# Patient Record
Sex: Female | Born: 1955 | Race: White | Hispanic: No | Smoking: Never smoker
Health system: Southern US, Community
[De-identification: ages and names within clinical notes are randomized; demographics above are authoritative.]

## PROBLEM LIST (undated history)

## (undated) DIAGNOSIS — I10 Essential (primary) hypertension: Secondary | ICD-10-CM

## (undated) DIAGNOSIS — K219 Gastro-esophageal reflux disease without esophagitis: Secondary | ICD-10-CM

## (undated) DIAGNOSIS — T7840XA Allergy, unspecified, initial encounter: Secondary | ICD-10-CM

## (undated) DIAGNOSIS — R011 Cardiac murmur, unspecified: Secondary | ICD-10-CM

## (undated) DIAGNOSIS — L309 Dermatitis, unspecified: Secondary | ICD-10-CM

## (undated) DIAGNOSIS — M1990 Unspecified osteoarthritis, unspecified site: Secondary | ICD-10-CM

## (undated) HISTORY — PX: MASTECTOMY: SHX3

## (undated) HISTORY — DX: Cardiac murmur, unspecified: R01.1

## (undated) HISTORY — PX: GANGLION CYST EXCISION: SHX1691

## (undated) HISTORY — PX: TOTAL ABDOMINAL HYSTERECTOMY: SHX209

## (undated) HISTORY — DX: Allergy, unspecified, initial encounter: T78.40XA

## (undated) HISTORY — PX: BREAST BIOPSY: SHX20

## (undated) HISTORY — DX: Gastro-esophageal reflux disease without esophagitis: K21.9

## (undated) HISTORY — DX: Dermatitis, unspecified: L30.9

## (undated) HISTORY — PX: AUGMENTATION MAMMAPLASTY: SUR837

## (undated) HISTORY — PX: ABDOMINAL HYSTERECTOMY: SHX81

---

## 2006-11-26 ENCOUNTER — Emergency Department: Payer: Self-pay | Admitting: Emergency Medicine

## 2007-09-12 IMAGING — CT CT HEAD WITHOUT CONTRAST
2 series · 16 of 30 positions shown, 20 images · non-contrast
Comparison: none

REASON FOR EXAM: headache
COMMENTS:

PROCEDURE:     CT  - CT HEAD WITHOUT CONTRAST  - November 26, 2006  [DATE]
RESULT:
HISTORY: Headache.
COMPARISON STUDIES: No recent.
PROCEDURE AND FINDINGS: No intra-axial or extra-axial pathologic fluid or
blood collections identified.  No mass lesion is noted. No bony
abnormalities are identified.

[Series 2: without · axial · non-contrast · 0.39mm/px · z∈[+407,+532]mm · 13 of 31 slices shown, 17 images]
[im 3/31  brain]
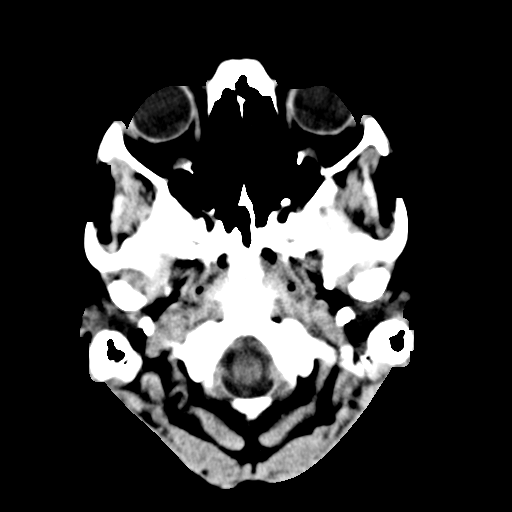
[im 3/31  bone]
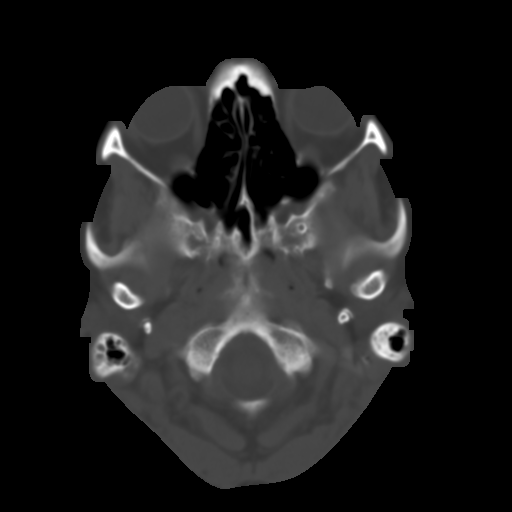
[im 5/31  brain]
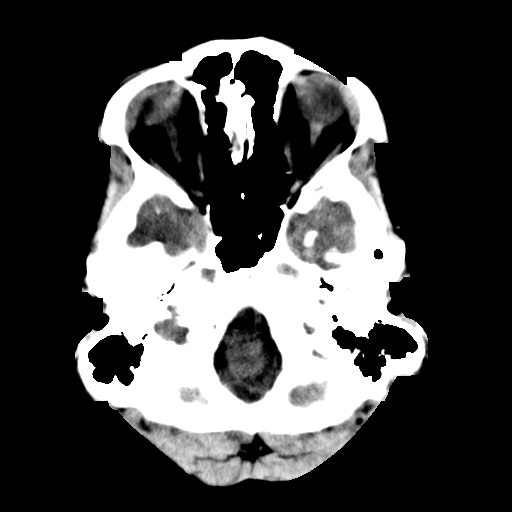
[im 7/31  brain]
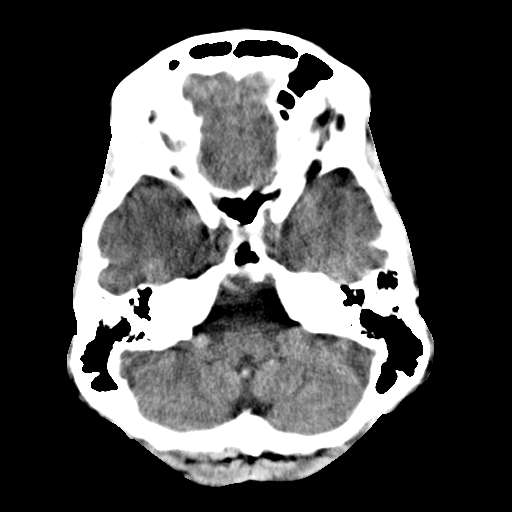
[im 9/31  brain]
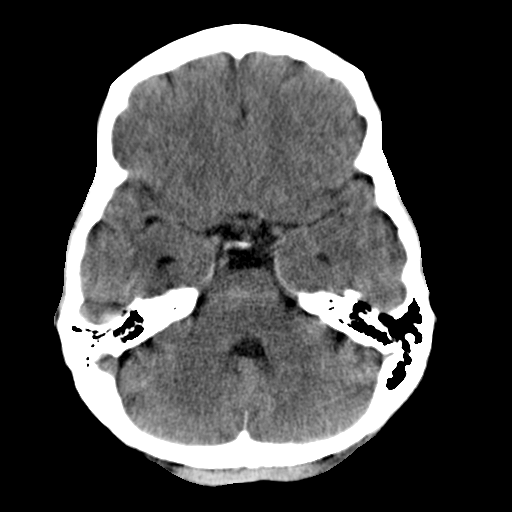
[im 11/31  brain]
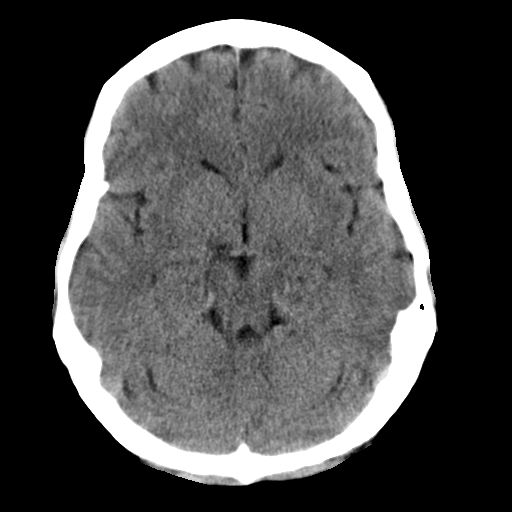
[im 11/31  bone]
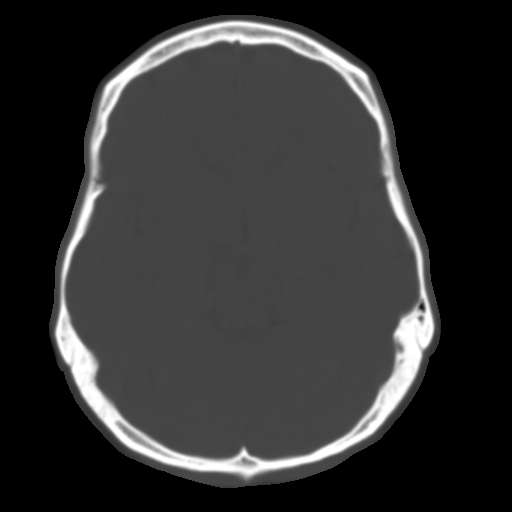
[im 13/31  brain]
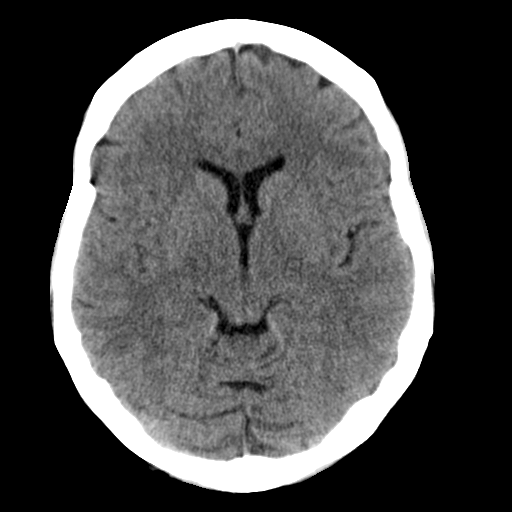
[im 16/31  brain]
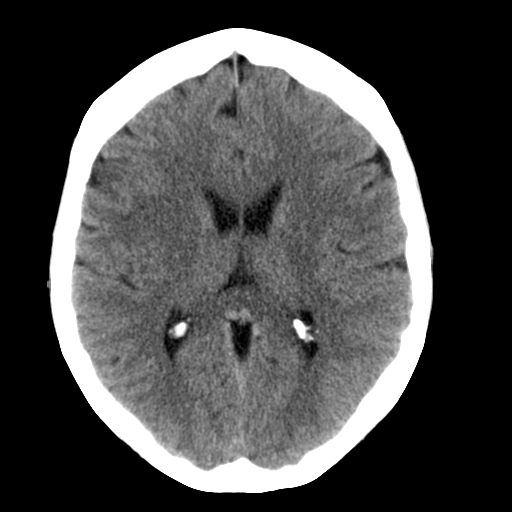
[im 18/31  brain]
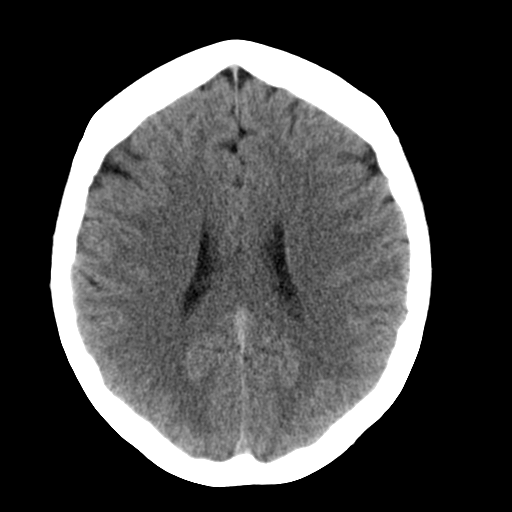
[im 20/31  brain]
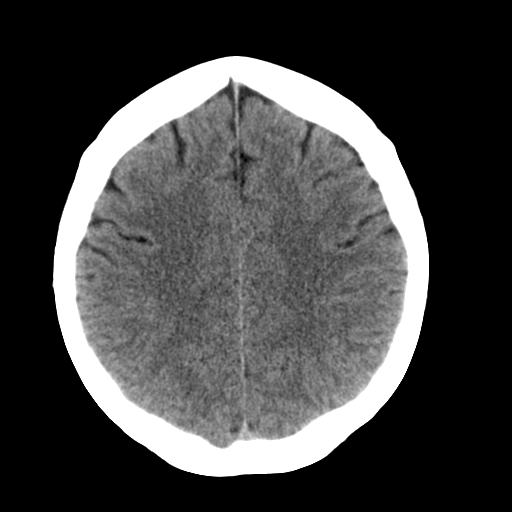
[im 20/31  bone]
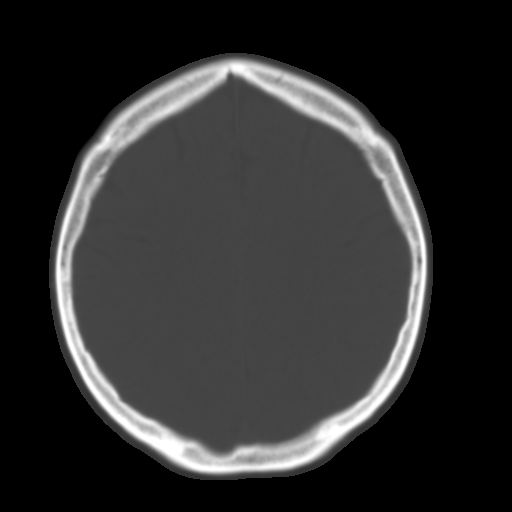
[im 22/31  brain]
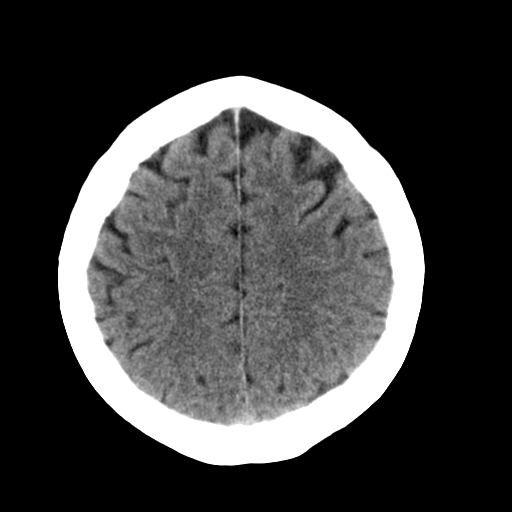
[im 24/31  brain]
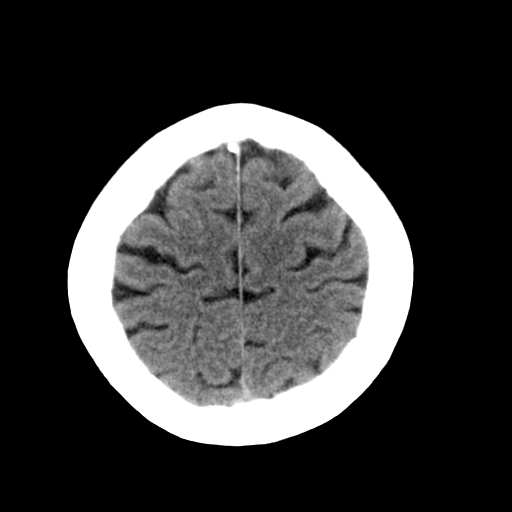
[im 26/31  brain]
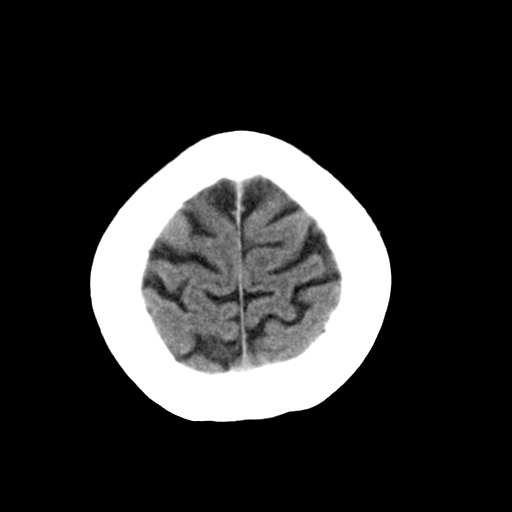
[im 28/31  brain]
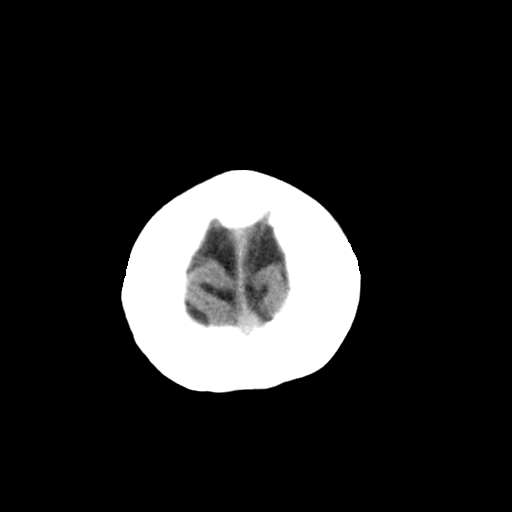
[im 28/31  bone]
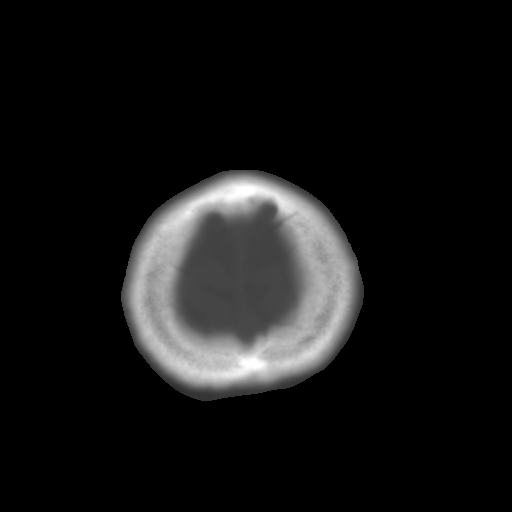

[Series 3: bone · axial · 0.39mm/px · z∈[+407,+447]mm · 3 of 31 slices shown]
[im 3/31  bone]
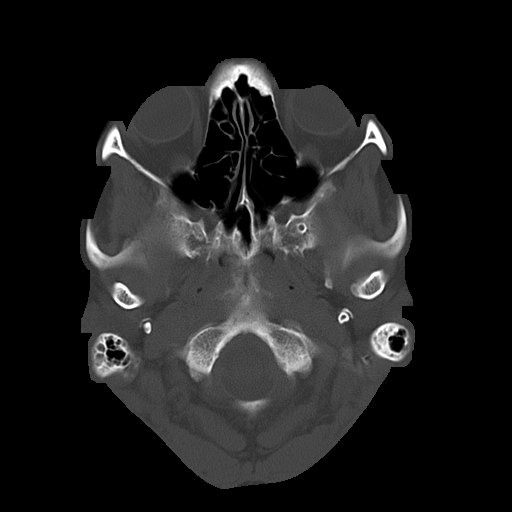
[im 7/31  bone]
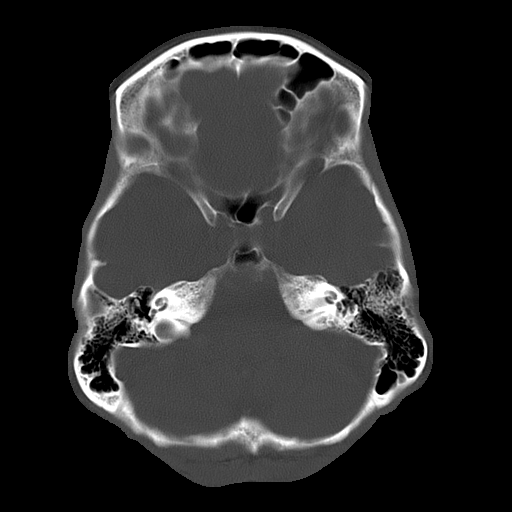
[im 11/31  bone]
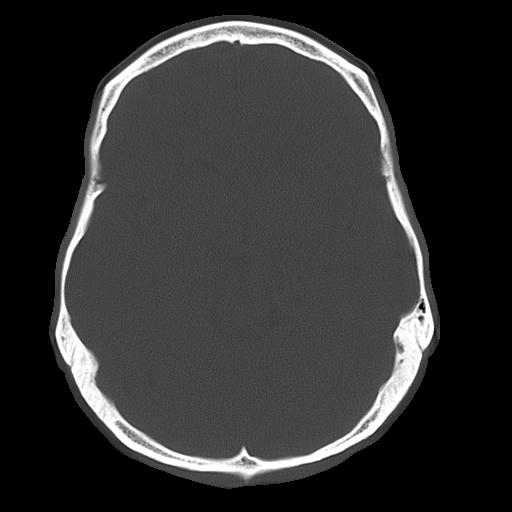

[16 of 30 positions shown; findings below may reference images not displayed]

IMPRESSION: 1)No acute intracranial abnormalities identified.

## 2008-03-16 ENCOUNTER — Ambulatory Visit: Payer: Self-pay | Admitting: Obstetrics and Gynecology

## 2008-03-22 ENCOUNTER — Ambulatory Visit: Payer: Self-pay | Admitting: Obstetrics and Gynecology

## 2013-06-30 ENCOUNTER — Ambulatory Visit (INDEPENDENT_AMBULATORY_CARE_PROVIDER_SITE_OTHER): Payer: Self-pay | Admitting: General Surgery

## 2013-06-30 ENCOUNTER — Encounter: Payer: Self-pay | Admitting: General Surgery

## 2013-06-30 VITALS — BP 140/80 | HR 76 | Resp 12 | Ht 67.0 in | Wt 157.0 lb

## 2013-06-30 DIAGNOSIS — R229 Localized swelling, mass and lump, unspecified: Secondary | ICD-10-CM | POA: Insufficient documentation

## 2013-06-30 NOTE — Patient Instructions (Addendum)
Patient to return in 1 week for skin biopsy.

## 2013-06-30 NOTE — Progress Notes (Signed)
Patient ID: Marie Nguyen, female   DOB: [birth year], 57 y.o.   MRN: [id number]  Chief Complaint  Patient presents with  . Other    Polyarterisis Nodosa     HPI Marie Nguyen is a 57 y.o. female who presents for an evaluation of skin nodules. The patient states she noticed a couple of weeks ago knots in her right lower extremity. No pain associated with this but she does state her right leg that has an aching sensation. She has taken a five day course of Prednisone and says the nodules in thigh area have improved a lot.She has some prominent ones in her right calf.  HPI  Past Medical History  Diagnosis Date  . Allergy   . GERD (gastroesophageal reflux disease)     Past Surgical History  Procedure Laterality Date  . Cesarean section    . Abdominal hysterectomy      History reviewed. No pertinent family history.  Social History History  Substance Use Topics  . Smoking status: Never Smoker   . Smokeless tobacco: Not on file  . Alcohol Use: Yes    Allergies  Allergen Reactions  . Latex Rash    Current Outpatient Prescriptions  Medication Sig Dispense Refill  . cetirizine (ZYRTEC) 10 MG tablet Take 10 mg by mouth daily.      Marland Kitchen loratadine (CLARITIN) 10 MG tablet Take 10 mg by mouth daily.      . ranitidine (ZANTAC) 150 MG capsule Take 150 mg by mouth 2 (two) times daily.       No current facility-administered medications for this visit.    Review of Systems Review of Systems  Constitutional: Negative.   Respiratory: Negative.   Cardiovascular: Negative.     Blood pressure 140/80, pulse 76, resp. rate 12, height 5\' 7"  (1.702 m), weight 157 lb (71.215 kg).  Physical Exam Physical Exam  Constitutional: She is oriented to person, place, and time. She appears well-developed and well-nourished.  Eyes: Conjunctivae are normal. No scleral icterus.  Cardiovascular: Normal rate, regular rhythm and normal heart sounds.   No murmur heard. Pulses:      Dorsalis pedis  pulses are 2+ on the right side, and 2+ on the left side.       Posterior tibial pulses are 2+ on the right side, and 2+ on the left side.  No edema. No varicose veins.  Pulmonary/Chest: Effort normal and breath sounds normal.  Abdominal: Soft. Bowel sounds are normal. There is no tenderness.  Lymphadenopathy:    She has no cervical adenopathy.       Right: No inguinal adenopathy present.       Left: No inguinal adenopathy present.  Neurological: She is alert and oriented to person, place, and time.  Skin: Skin is warm and dry.       Data Reviewed Notes from Zara Chess PA  Assessment    Skin induration and nodularity of new onset of right lower extremity. Findings are not indicative if any infection or reaction to some agent. Remote possibility of inflammatory condition.    Plan    Skin biopsy.     Targeted ultrasound of the skin in the right calf was performed clinical exam had shown as more moderate thickening of the skin and nodular fashion in several spots adjoining each other. Evaluation did not reveal any abnormality in the muscular tissue. The lesser saphenous vein it is identified and is normal. No large subcutaneous veins or evidence of phlebitis noted. Popliteal vein is  also normal. There is some mild prominence of the skin in the immediate the underlying subcutaneous tissue without any defined edges. Impression: Nonspecific findings as noted above  Treazure Nery G 06/30/2013, 3:10 PM

## 2013-07-02 ENCOUNTER — Encounter: Payer: Self-pay | Admitting: General Surgery

## 2013-07-06 ENCOUNTER — Ambulatory Visit (INDEPENDENT_AMBULATORY_CARE_PROVIDER_SITE_OTHER): Payer: Self-pay | Admitting: General Surgery

## 2013-07-06 ENCOUNTER — Encounter: Payer: Self-pay | Admitting: General Surgery

## 2013-07-06 VITALS — BP 132/82 | HR 88 | Resp 16 | Ht 67.0 in | Wt 152.0 lb

## 2013-07-06 DIAGNOSIS — R229 Localized swelling, mass and lump, unspecified: Secondary | ICD-10-CM

## 2013-07-06 NOTE — Progress Notes (Signed)
Patient presents for a right leg skin biopsy at today's visit. No new problems at this time.   Biopsy of the skin nodule right calf was performed today. 3 mL of 1% Xylocaine mixed with 0.5% Marcaine was used. This was over the most prominent skin thickening in the right calf. A 1 cm elliptical skin was excised with marked thickening noted. This was sent for pathology in formalin. The wound is closed with a single stitch of 4-0 nylon. Procedure was tolerated well with minimal discomfort. Dry sterile dressings placed and patient instructed on wound care. She will be notified of pathology when available. Skin suture needs to be removed in about 2 weeks

## 2013-07-06 NOTE — Patient Instructions (Signed)
Patient to return in 10 days for nurse visit for suture removal.

## 2013-07-09 LAB — PATHOLOGY

## 2013-07-10 ENCOUNTER — Telehealth: Payer: Self-pay | Admitting: *Deleted

## 2013-07-10 NOTE — Telephone Encounter (Signed)
Message was left for patient to call the office. Dr. Evette Cristal wants to speak with the patient.

## 2013-07-21 ENCOUNTER — Ambulatory Visit (INDEPENDENT_AMBULATORY_CARE_PROVIDER_SITE_OTHER): Payer: Self-pay | Admitting: *Deleted

## 2013-07-21 DIAGNOSIS — R229 Localized swelling, mass and lump, unspecified: Secondary | ICD-10-CM

## 2013-07-21 NOTE — Progress Notes (Signed)
Here todday for suture removal.  Area clean and healing well. Steri strips applied. Aware of pathology.

## 2013-07-21 NOTE — Patient Instructions (Addendum)
The patient is aware to call back for any questions or concerns.  

## 2014-07-26 ENCOUNTER — Encounter: Payer: Self-pay | Admitting: General Surgery

## 2017-05-30 ENCOUNTER — Ambulatory Visit (INDEPENDENT_AMBULATORY_CARE_PROVIDER_SITE_OTHER): Payer: 59 | Admitting: Podiatry

## 2017-05-30 ENCOUNTER — Ambulatory Visit (INDEPENDENT_AMBULATORY_CARE_PROVIDER_SITE_OTHER): Payer: 59

## 2017-05-30 DIAGNOSIS — M722 Plantar fascial fibromatosis: Secondary | ICD-10-CM

## 2017-05-30 DIAGNOSIS — M2012 Hallux valgus (acquired), left foot: Secondary | ICD-10-CM | POA: Diagnosis not present

## 2017-05-30 DIAGNOSIS — M2011 Hallux valgus (acquired), right foot: Secondary | ICD-10-CM

## 2017-05-30 MED ORDER — MELOXICAM 15 MG PO TABS: 15.0000 mg | ORAL_TABLET | Freq: Every day | ORAL | 0 refills | Status: DC

## 2017-05-30 NOTE — Progress Notes (Signed)
   Subjective:    Patient ID: Marie Nguyen, female    DOB: April 27, 1956, 61 y.o.   MRN: [id number]  HPI    Review of Systems  All other systems reviewed and are negative.      Objective:   Physical Exam        Assessment & Plan:

## 2017-05-30 NOTE — Progress Notes (Signed)
   Subjective:    Patient ID: Marie Nguyen, female    DOB: [birth year], 61 y.o.   MRN: [id number]  HPI this patient presents the office with chief complaint of pain noted in her left heel.  She says the pain has been progressively worsening over the last 6-8 months.  She states she has pain upon standing from a sitting position and in the a.m. upon rising.  She has attempted to use ibuprofen and a brace on her left foot but pain is worsening.  She says that her pain is 8 out of 10.  She says that the pain has worsened in the last 3 weeks and she has difficulty walking on her left foot at this time.  She presents the office today for an evaluation of her left heel.  She also says she started to have pain and discomfort in the big toe joint bunion on the right foot.    Review of Systems  All other systems reviewed and are negative.      Objective:   Physical Exam GENERAL APPEARANCE: Alert, conversant. Appropriately groomed. No acute distress.  VASCULAR: Pedal pulses are  palpable at  [facility] and PT bilateral.  Capillary refill time is immediate to all digits,  Normal temperature gradient.   NEUROLOGIC: sensation is normal to 5.07 monofilament at 5/5 sites bilateral.  Light touch is intact bilateral, Muscle strength normal.  MUSCULOSKELETAL: acceptable muscle strength, tone and stability bilateral.  Intrinsic muscluature intact bilateral.  Rectus appearance of foot and digits noted bilateral. Palpable pain noted at the insertion of the plantar fascia on the left heel  . Mild discomfort extending through the plantar fascia from the heel in the left foot  swelling noted in the left heel.  HAV first MPJ bilaterally...  DERMATOLOGIC: skin color, texture, and turgor are within normal limits.  No preulcerative lesions or ulcers  are seen, no interdigital maceration noted.  No open lesions present.  Digital nails are asymptomatic. No drainage noted.         Assessment & Plan:  Heel Spur left heel.   HAV 1st MPJ  B/L.   IE  X-rays reveal presence of calcification at the insertion of the plantar fascia left foot.  X-rays were taken also for future evaluation of her bunions bilaterally.  After discussion with this patient. I recommended stretching and ice to her left heel.  I also recommended a plantar fascial brace and prescribed Mobitz.  I also recommended an injection into her left heel  after the deposition of approximately 1 mL of the injection,  she pulled back her foot and said she felt a sharp shooting pain in the outside bottom of her left heel.  I discontinued any future injection feelings there was enough medication in her left heel.  I recommended the patient return the office in 2 weeks at which time we will reevaluate her heel and determine future treatment.   Gardiner Barefoot DPM.

## 2017-06-13 ENCOUNTER — Encounter: Payer: Self-pay | Admitting: Podiatry

## 2017-06-13 ENCOUNTER — Ambulatory Visit (INDEPENDENT_AMBULATORY_CARE_PROVIDER_SITE_OTHER): Payer: 59 | Admitting: Podiatry

## 2017-06-13 DIAGNOSIS — M722 Plantar fascial fibromatosis: Secondary | ICD-10-CM | POA: Diagnosis not present

## 2017-06-13 DIAGNOSIS — M2012 Hallux valgus (acquired), left foot: Secondary | ICD-10-CM | POA: Diagnosis not present

## 2017-06-13 DIAGNOSIS — M2011 Hallux valgus (acquired), right foot: Secondary | ICD-10-CM

## 2017-06-13 NOTE — Progress Notes (Signed)
This patient presents to the office follow-up for diagnosis of plantar fasciitis, left heel.  She says that the Mobic caused her to have significant skin eruptions, so she discontinued the medicine. She says that the brace on her left foot is helping.  She says she still has mild discomfort upon rising but it has improved since she started wearing the brace.  She also states that she is now walking, keeping her heel off the ground purposely to prevent any pain from recurring.  She was prepped is having less pain and discomfort at the site, but she has not completely healed at this juncture.  GENERAL APPEARANCE: Alert, conversant. Appropriately groomed. No acute distress.  VASCULAR: Pedal pulses are  palpable at  [facility] and PT bilateral.  Capillary refill time is immediate to all digits,  Normal temperature gradient.  Digital hair growth is present bilateral  NEUROLOGIC: sensation is normal to 5.07 monofilament at 5/5 sites bilateral.  Light touch is intact bilateral, Muscle strength normal.  MUSCULOSKELETAL: acceptable muscle strength, tone and stability bilateral.  Intrinsic muscluature intact bilateral.  Palpable pain at the insertion of the plantar fascia of the left foot has resolved.  No pain extending through the arch of the left foot.  HAV first MPJ bilateral.   DERMATOLOGIC: skin color, texture, and turgor are within normal limits.  No preulcerative lesions or ulcers  are seen, no interdigital maceration noted.  No open lesions present.  Digital nails are asymptomatic. No drainage noted.  Plantar fasciitis left foot.  ROV.  After evaluation and discussion with this patient. She has improved from the previous visit  . I told her to continue to discontinue the Mobic.  Due to the fact that she is not walking by putting weight through her left heel. I am limited in any further treatment.  Therefore, I recommended that she continue to wear the brace and to return to the office when pain recurs   Gardiner Barefoot DPM.

## 2018-11-05 ENCOUNTER — Ambulatory Visit (INDEPENDENT_AMBULATORY_CARE_PROVIDER_SITE_OTHER): Payer: BLUE CROSS/BLUE SHIELD | Admitting: Obstetrics and Gynecology

## 2018-11-05 ENCOUNTER — Other Ambulatory Visit: Payer: Self-pay

## 2018-11-05 ENCOUNTER — Encounter: Payer: Self-pay | Admitting: Obstetrics and Gynecology

## 2018-11-05 VITALS — BP 152/98 | Ht 67.0 in | Wt 158.0 lb

## 2018-11-05 DIAGNOSIS — Z1211 Encounter for screening for malignant neoplasm of colon: Secondary | ICD-10-CM

## 2018-11-05 DIAGNOSIS — Z1321 Encounter for screening for nutritional disorder: Secondary | ICD-10-CM

## 2018-11-05 DIAGNOSIS — Z01419 Encounter for gynecological examination (general) (routine) without abnormal findings: Secondary | ICD-10-CM | POA: Diagnosis not present

## 2018-11-05 DIAGNOSIS — Z1239 Encounter for other screening for malignant neoplasm of breast: Secondary | ICD-10-CM

## 2018-11-05 DIAGNOSIS — Z13220 Encounter for screening for lipoid disorders: Secondary | ICD-10-CM

## 2018-11-05 DIAGNOSIS — Z131 Encounter for screening for diabetes mellitus: Secondary | ICD-10-CM

## 2018-11-05 DIAGNOSIS — Z1329 Encounter for screening for other suspected endocrine disorder: Secondary | ICD-10-CM

## 2018-11-05 DIAGNOSIS — R011 Cardiac murmur, unspecified: Secondary | ICD-10-CM

## 2018-11-05 NOTE — Progress Notes (Signed)
Gynecology Annual Exam  PCP: Arlis Porta., MD (Inactive)  Chief Complaint:  Chief Complaint  Patient presents with  . Gynecologic Exam    History of Present Illness:Patient is a 63 y.o. [id number] presents for annual exam. The patient has no complaints today.   LMP: No LMP recorded. Patient has had a hysterectomy.  The patient is sexually active. She denies dyspareunia.  The patient does perform self breast exams.  There is no notable family history of breast or ovarian cancer in her family.  The patient wears seatbelts: yes.   The patient has regular exercise: not asked.    The patient denies current symptoms of depression.     Patient was told she had heart murmur at urgent care who recommended further workup.  Sleeps on two pillows, no edema, no paroxysmal nocturnal dyspena, no chest pain, no palpitations  Review of Systems: Review of Systems  Constitutional: Negative for chills and fever.  HENT: Negative for congestion.   Respiratory: Negative for cough and shortness of breath.   Cardiovascular: Negative for chest pain and palpitations.  Gastrointestinal: Negative for abdominal pain, constipation, diarrhea, heartburn, nausea and vomiting.  Genitourinary: Negative for dysuria, frequency and urgency.  Skin: Negative for itching and rash.  Neurological: Negative for dizziness and headaches.  Endo/Heme/Allergies: Negative for polydipsia.  Psychiatric/Behavioral: Negative for depression.    Past Medical History:  Past Medical History:  Diagnosis Date  . Allergy   . GERD (gastroesophageal reflux disease)     Past Surgical History:  Past Surgical History:  Procedure Laterality Date  . ABDOMINAL HYSTERECTOMY    . CESAREAN SECTION      Gynecologic History:  No LMP recorded. Patient has had a hysterectomy. Last Pap: Results were: N/A status post hysterectomy  Obstetric History: [id number]  Family History:  Family History  Problem Relation Age of Onset  .  Cancer Mother   . Hypertension Mother   . Diabetes Father   . Hypertension Father     Social History:  Social History   Socioeconomic History  . Marital status: Divorced    Spouse name: Not on file  . Number of children: Not on file  . Years of education: Not on file  . Highest education level: Not on file  Occupational History  . Not on file  Social Needs  . Financial resource strain: Not on file  . Food insecurity:    Worry: Not on file    Inability: Not on file  . Transportation needs:    Medical: Not on file    Non-medical: Not on file  Tobacco Use  . Smoking status: Never Smoker  . Smokeless tobacco: Never Used  Substance and Sexual Activity  . Alcohol use: Yes  . Drug use: No  . Sexual activity: Not on file  Lifestyle  . Physical activity:    Days per week: Not on file    Minutes per session: Not on file  . Stress: Not on file  Relationships  . Social connections:    Talks on phone: Not on file    Gets together: Not on file    Attends religious service: Not on file    Active member of club or organization: Not on file    Attends meetings of clubs or organizations: Not on file    Relationship status: Not on file  . Intimate partner violence:    Fear of current or ex partner: Not on file    Emotionally abused:  Not on file    Physically abused: Not on file    Forced sexual activity: Not on file  Other Topics Concern  . Not on file  Social History Narrative  . Not on file    Allergies:  Allergies  Allergen Reactions  . Latex Rash    Medications: Prior to Admission medications   Medication Sig Start Date End Date Taking? Authorizing Provider  cetirizine (ZYRTEC) 10 MG tablet Take 10 mg by mouth daily.   Yes [provider]  fluocinonide (LIDEX) 0.05 % external solution  04/11/17   [provider]  ketoconazole (NIZORAL) 2 % shampoo  04/10/17   [provider]  meloxicam (MOBIC) 15 MG tablet Take 1 tablet (15 mg total) by  mouth daily. Patient not taking: Reported on 11/05/2018 05/30/17   Gardiner Barefoot, DPM  ranitidine (ZANTAC) 150 MG capsule Take 150 mg by mouth 2 (two) times daily.    [provider]  triamcinolone cream (KENALOG) 0.1 %  04/10/17   [provider]    Physical Exam Vitals: Blood pressure (!) 152/98, height 5\' 7"  (1.702 m), weight 158 lb (71.7 kg).  General: NAD HEENT: normocephalic, anicteric Thyroid: no enlargement, no palpable nodules Pulmonary: No increased work of breathing, CTAB Cardiovascular: RRR, distal pulses 2+ Breast: Breast symmetrical, no tenderness, no palpable nodules or masses, no skin or nipple retraction present, no nipple discharge.  No axillary or supraclavicular lymphadenopathy.  Bilateral periareolar scars, and suprapectoral silicone implants.  She does have some capsular contracture of the right implant. Abdomen: NABS, soft, non-tender, non-distended.  Umbilicus without lesions.  No hepatomegaly, splenomegaly or masses palpable. No evidence of hernia  Genitourinary:  External: Normal external female genitalia.  Normal urethral meatus, normal Bartholin's and Skene's glands.    Vagina: Normal vaginal mucosa, no evidence of prolapse.    Cervix: surgically absent  Uterus: surgically absent  Adnexa: ovaries non-enlarged, no adnexal masses  Rectal: deferred  Lymphatic: no evidence of inguinal lymphadenopathy Extremities: no edema, erythema, or tenderness Neurologic: Grossly intact Psychiatric: mood appropriate, affect full  Female chaperone present for pelvic and breast  portions of the physical exam     Assessment: 63 y.o. [id number] routine annual exam  Plan: Problem List Items Addressed This Visit    None    Visit Diagnoses    Encounter for gynecological examination without abnormal finding    -  Primary   Breast screening       Relevant Orders   MM 3D SCREEN BREAST BILATERAL   Colon cancer screening       Relevant Orders   Ambulatory  referral to Gastroenterology   Heart murmur       Relevant Orders   Ambulatory referral to Cardiology   Thyroid disorder screening       Relevant Orders   CMP14+LP+TP+TSH+CBC/Plt   Diabetes mellitus screening       Relevant Orders   CMP14+LP+TP+TSH+CBC/Plt   Lipid screening       Relevant Orders   CMP14+LP+TP+TSH+CBC/Plt   Encounter for vitamin deficiency screening       Relevant Orders   CMP14+LP+TP+TSH+CBC/Plt      1) Mammogram - recommend yearly screening mammogram.  Mammogram Was ordered today  2) STI screening  was notoffered and therefore not obtained  3) ASCCP guidelines and rational discussed.  Patient opts for discontinue secondary to prior hysterectomy screening interval  4) Osteoporosis  - per USPTF routine screening DEXA at age 3  Consider FDA-approved medical therapies in postmenopausal  women and men aged 39 years and older, based on the following: a) A hip or vertebral (clinical or morphometric) fracture b) T-score ? -2.5 at the femoral neck or spine after appropriate evaluation to exclude secondary causes C) Low bone mass (T-score between -1.0 and -2.5 at the femoral neck or spine) and a 10-year probability of a hip fracture ? 3% or a 10-year probability of a major osteoporosis-related fracture ? 20% based on the [place]-adapted WHO algorithm   5) Routine healthcare maintenance including cholesterol, diabetes screening discussed To return fasting at a later date  6) Colonoscopy ordered  7) Heart murmur - not appreciated on exam today.  Cardiology referral placed  8) Return in about 1 year (around 11/06/2019) for annual (to return for fasting labs in the next 1-2 weeks).    Malachy Mood, MD Mosetta Pigeon, [facility] 11/05/2018, 2:29 PM

## 2018-11-05 NOTE — Patient Instructions (Signed)
[facility] [address] [place] [address]  [facility] [place]  [address] [place]  [address]  Phone: [phone]  

## 2018-11-19 ENCOUNTER — Other Ambulatory Visit: Payer: BLUE CROSS/BLUE SHIELD

## 2018-11-19 DIAGNOSIS — Z1329 Encounter for screening for other suspected endocrine disorder: Secondary | ICD-10-CM

## 2018-11-19 DIAGNOSIS — Z131 Encounter for screening for diabetes mellitus: Secondary | ICD-10-CM

## 2018-11-19 DIAGNOSIS — Z1321 Encounter for screening for nutritional disorder: Secondary | ICD-10-CM

## 2018-11-19 DIAGNOSIS — Z13220 Encounter for screening for lipoid disorders: Secondary | ICD-10-CM

## 2018-11-20 LAB — CMP14+LP+TP+TSH+CBC/PLT
ALT: 14 IU/L (ref 0–32)
AST: 21 IU/L (ref 0–40)
Albumin/Globulin Ratio: 2.3 — ABNORMAL HIGH (ref 1.2–2.2)
Albumin: 4.5 g/dL (ref 3.8–4.9)
Alkaline Phosphatase: 60 IU/L (ref 39–117)
BUN / CREAT RATIO: 12 (ref 12–28)
BUN: 9 mg/dL (ref 8–27)
Bilirubin Total: 1.1 mg/dL (ref 0.0–1.2)
CO2: 24 mmol/L (ref 20–29)
Calcium: 9.5 mg/dL (ref 8.7–10.3)
Chloride: 106 mmol/L (ref 96–106)
Cholesterol, Total: 181 mg/dL (ref 100–199)
Creatinine, Ser: 0.74 mg/dL (ref 0.57–1.00)
Free Thyroxine Index: 2.3 (ref 1.2–4.9)
GFR calc Af Amer: 102 mL/min/{1.73_m2} (ref >59)
GFR calc non Af Amer: 88 mL/min/{1.73_m2} (ref >59)
Globulin, Total: 2 g/dL (ref 1.5–4.5)
Glucose: 91 mg/dL (ref 65–99)
HDL: 65 mg/dL (ref >39)
Hematocrit: 44.2 % (ref 34.0–46.6)
Hemoglobin: 14.7 g/dL (ref 11.1–15.9)
LDL Calculated: 101 mg/dL — ABNORMAL HIGH (ref 0–99)
LDl/HDL Ratio: 1.6 ratio (ref 0.0–3.2)
MCH: 31.3 pg (ref 26.6–33.0)
MCHC: 33.3 g/dL (ref 31.5–35.7)
MCV: 94 fL (ref 79–97)
PLATELETS: 271 10*3/uL (ref 150–450)
Potassium: 4.3 mmol/L (ref 3.5–5.2)
RBC: 4.7 x10E6/uL (ref 3.77–5.28)
RDW: 12.8 % (ref 11.7–15.4)
SODIUM: 144 mmol/L (ref 134–144)
T3 Uptake Ratio: 28 % (ref 24–39)
T4, Total: 8.3 ug/dL (ref 4.5–12.0)
TSH: 2.81 u[IU]/mL (ref 0.450–4.500)
Total Protein: 6.5 g/dL (ref 6.0–8.5)
Triglycerides: 75 mg/dL (ref 0–149)
VLDL Cholesterol Cal: 15 mg/dL (ref 5–40)
WBC: 5.1 10*3/uL (ref 3.4–10.8)

## 2018-11-24 ENCOUNTER — Ambulatory Visit
Admission: RE | Admit: 2018-11-24 | Discharge: 2018-11-24 | Disposition: A | Discharge status: Home / Self Care | Payer: BLUE CROSS/BLUE SHIELD | Source: Ambulatory Visit | Attending: Gastroenterology | Admitting: Gastroenterology

## 2018-11-24 ENCOUNTER — Encounter: Payer: Self-pay | Admitting: *Deleted

## 2018-11-24 ENCOUNTER — Encounter
Admission: RE | Disposition: A | Discharge status: Home / Self Care | Payer: Self-pay | Source: Ambulatory Visit | Attending: Gastroenterology

## 2018-11-24 ENCOUNTER — Ambulatory Visit: Payer: BLUE CROSS/BLUE SHIELD | Admitting: Anesthesiology

## 2018-11-24 DIAGNOSIS — D120 Benign neoplasm of cecum: Secondary | ICD-10-CM | POA: Diagnosis not present

## 2018-11-24 DIAGNOSIS — K219 Gastro-esophageal reflux disease without esophagitis: Secondary | ICD-10-CM | POA: Insufficient documentation

## 2018-11-24 DIAGNOSIS — D122 Benign neoplasm of ascending colon: Secondary | ICD-10-CM

## 2018-11-24 DIAGNOSIS — Z1211 Encounter for screening for malignant neoplasm of colon: Secondary | ICD-10-CM | POA: Insufficient documentation

## 2018-11-24 DIAGNOSIS — K640 First degree hemorrhoids: Secondary | ICD-10-CM | POA: Insufficient documentation

## 2018-11-24 DIAGNOSIS — Z791 Long term (current) use of non-steroidal anti-inflammatories (NSAID): Secondary | ICD-10-CM | POA: Diagnosis not present

## 2018-11-24 HISTORY — PX: COLONOSCOPY WITH PROPOFOL: SHX5780

## 2018-11-24 SURGERY — COLONOSCOPY WITH PROPOFOL
Anesthesia: General

## 2018-11-24 MED ORDER — SODIUM CHLORIDE 0.9 % IV SOLN
INTRAVENOUS | Status: DC
  Administered 2018-11-24: 09:00:00 via INTRAVENOUS

## 2018-11-24 MED ORDER — PROPOFOL 10 MG/ML IV BOLUS
INTRAVENOUS | Status: DC | PRN
  Administered 2018-11-24: 70 mg via INTRAVENOUS

## 2018-11-24 MED ORDER — PROPOFOL 500 MG/50ML IV EMUL
INTRAVENOUS | Status: AC
  Filled 2018-11-24: qty 50

## 2018-11-24 MED ORDER — PROPOFOL 500 MG/50ML IV EMUL
INTRAVENOUS | Status: DC | PRN
  Administered 2018-11-24: 150 ug/kg/min via INTRAVENOUS

## 2018-11-24 MED ORDER — LIDOCAINE 2% (20 MG/ML) 5 ML SYRINGE
INTRAMUSCULAR | Status: DC | PRN
  Administered 2018-11-24: 50 mg via INTRAVENOUS

## 2018-11-24 MED ORDER — LIDOCAINE HCL (PF) 2 % IJ SOLN
INTRAMUSCULAR | Status: AC
  Filled 2018-11-24: qty 10

## 2018-11-24 NOTE — Transfer of Care (Signed)
Immediate Anesthesia Transfer of Care Note  Patient: Marie Nguyen  Procedure(s) Performed: COLONOSCOPY WITH PROPOFOL (N/A )  Patient Location: Endoscopy Unit  Anesthesia Type:General  Level of Consciousness: drowsy  Airway & Oxygen Therapy: Patient Spontanous Breathing  Post-op Assessment: Post -op Vital signs reviewed and stable  Post vital signs: stable  Last Vitals:  Vitals Value Taken Time  BP 135/76 11/24/2018 10:10 AM  Temp 36.4 C 11/24/2018 10:10 AM  Pulse 65 11/24/2018 10:12 AM  Resp 23 11/24/2018 10:12 AM  SpO2 98 % 11/24/2018 10:12 AM  Vitals shown include unvalidated device data.  Last Pain:  Vitals:   11/24/18 1010  TempSrc: Tympanic  PainSc: 0-No pain         Complications: No apparent anesthesia complications

## 2018-11-24 NOTE — Op Note (Signed)
[facility] Gastroenterology Patient Name: Marie Nguyen Procedure Date: 11/24/2018 9:20 AM MRN: [id number] Account #: [id number] Date of Birth: [birth year] Admit Type: Outpatient Age: 63 Room: [facility] ENDO ROOM 2 Gender: Female Note Status: Finalized Procedure:            Colonoscopy Indications:          Screening for colorectal malignant neoplasm Providers:            Jonathon Bellows MD, MD Referring MD:         Stoney Bang. Georgianne Fick (Referring MD) Medicines:            Monitored Anesthesia Care Complications:        No immediate complications. Procedure:            Pre-Anesthesia Assessment:                       - Prior to the procedure, a History and Physical was                        performed, and patient medications, allergies and                        sensitivities were reviewed. The patient's tolerance of                        previous anesthesia was reviewed.                       - The risks and benefits of the procedure and the                        sedation options and risks were discussed with the                        patient. All questions were answered and informed                        consent was obtained.                       - ASA Grade Assessment: II - A patient with mild                        systemic disease.                       After obtaining informed consent, the colonoscope was                        passed under direct vision. Throughout the procedure,                        the patient's blood pressure, pulse, and oxygen                        saturations were monitored continuously. The                        Colonoscope was introduced through the anus and  advanced to the the cecum, identified by the                        appendiceal orifice, IC valve and transillumination.                        The colonoscopy was performed without difficulty. The                        patient tolerated the procedure  well. The quality of                        the bowel preparation was good. Findings:      The perianal and digital rectal examinations were normal.      Non-bleeding internal hemorrhoids were found during retroflexion. The       hemorrhoids were medium-sized and Grade I (internal hemorrhoids that do       not prolapse).      A 15 mm polyp was found in the cecum. The polyp was flat. Preparations       were made for mucosal resection. Eleview was injected to raise the       lesion. Snare mucosal resection was performed. Resection and retrieval       were complete. To close a defect after mucosal resection, three       hemostatic clips were successfully placed. There was no bleeding during,       or at the end, of the procedure.      The exam was otherwise without abnormality on direct and retroflexion       views. Impression:           - Non-bleeding internal hemorrhoids.                       - One 15 mm polyp in the cecum, removed with mucosal                        resection. Resected and retrieved. Clips were placed.                       - The examination was otherwise normal on direct and                        retroflexion views.                       - Mucosal resection was performed. Resection and                        retrieval were complete. Recommendation:       - Discharge patient to home (with escort).                       - Resume previous diet.                       - Continue present medications.                       - Await pathology results.                       -  Repeat colonoscopy for surveillance based on                        pathology results. Procedure Code(s):    --- Professional ---                       [phone], Colonoscopy, flexible; with endoscopic mucosal                        resection Diagnosis Code(s):    --- Professional ---                       Z12.11, Encounter for screening for malignant neoplasm                        of colon                        D12.0, Benign neoplasm of cecum                       K64.0, First degree hemorrhoids CPT copyright 2018 American Medical Association. All rights reserved. The codes documented in this report are preliminary and upon coder review may  be revised to meet current compliance requirements. Jonathon Bellows, MD Jonathon Bellows MD, MD 11/24/2018 10:09:22 AM This report has been signed electronically. Number of Addenda: 0 Note Initiated On: 11/24/2018 9:20 AM Scope Withdrawal Time: 0 hours 20 minutes 47 seconds  Total Procedure Duration: 0 hours 26 minutes 8 seconds       [facility]

## 2018-11-24 NOTE — H&P (Signed)
Jonathon Bellows, MD [address], [place], [place], [place], [address] [address], [place], [place], [place], [address] Phone: [phone]  Fax: [phone]  Primary Care Physician:  [facility], [facility]   Pre-Procedure History & Physical: HPI:  Marie Nguyen is a 63 y.o. female is here for an colonoscopy.   Past Medical History:  Diagnosis Date  . Allergy   . GERD (gastroesophageal reflux disease)     Past Surgical History:  Procedure Laterality Date  . ABDOMINAL HYSTERECTOMY    . CESAREAN SECTION      Prior to Admission medications   Medication Sig Start Date End Date Taking? Authorizing Provider  cetirizine (ZYRTEC) 10 MG tablet Take 10 mg by mouth daily.   Yes [provider]  omeprazole (PRILOSEC) 20 MG capsule Take 20 mg by mouth daily.   Yes [provider]  fluocinonide (LIDEX) 0.05 % external solution  04/11/17   [provider]  ketoconazole (NIZORAL) 2 % shampoo  04/10/17   [provider]  meloxicam (MOBIC) 15 MG tablet Take 1 tablet (15 mg total) by mouth daily. Patient not taking: Reported on 11/05/2018 05/30/17   Gardiner Barefoot, DPM  ranitidine (ZANTAC) 150 MG capsule Take 150 mg by mouth 2 (two) times daily.    [provider]  triamcinolone cream (KENALOG) 0.1 %  04/10/17   [provider]    Allergies as of 11/06/2018 - Review Complete 11/05/2018  Allergen Reaction Noted  . Latex Rash 06/30/2013    Family History  Problem Relation Age of Onset  . Cancer Mother   . Hypertension Mother   . Diabetes Father   . Hypertension Father     Social History   Socioeconomic History  . Marital status: Divorced    Spouse name: Not on file  . Number of children: Not on file  . Years of education: Not on file  . Highest education level: Not on file  Occupational History  . Not on file  Social Needs  . Financial resource strain: Not on file  . Food insecurity:    Worry: Not on file   Inability: Not on file  . Transportation needs:    Medical: Not on file    Non-medical: Not on file  Tobacco Use  . Smoking status: Never Smoker  . Smokeless tobacco: Never Used  Substance and Sexual Activity  . Alcohol use: Yes  . Drug use: No  . Sexual activity: Not on file  Lifestyle  . Physical activity:    Days per week: Not on file    Minutes per session: Not on file  . Stress: Not on file  Relationships  . Social connections:    Talks on phone: Not on file    Gets together: Not on file    Attends religious service: Not on file    Active member of club or organization: Not on file    Attends meetings of clubs or organizations: Not on file    Relationship status: Not on file  . Intimate partner violence:    Fear of current or ex partner: Not on file    Emotionally abused: Not on file    Physically abused: Not on file    Forced sexual activity: Not on file  Other Topics Concern  . Not on file  Social History Narrative  . Not on file    Review of Systems: See HPI, otherwise negative ROS  Physical Exam: BP (!) 142/88  Pulse 69   Temp 97.9 F (36.6 C) (Tympanic)   Resp 16   Ht 5\' 7"  (1.702 m)   Wt 63.5 kg   SpO2 100%   BMI 21.93 kg/m  General:   Alert,  pleasant and cooperative in NAD Head:  Normocephalic and atraumatic. Neck:  Supple; no masses or thyromegaly. Lungs:  Clear throughout to auscultation, normal respiratory effort.    Heart:  +S1, +S2, Regular rate and rhythm, No edema. Abdomen:  Soft, nontender and nondistended. Normal bowel sounds, without guarding, and without rebound.   Neurologic:  Alert and  oriented x4;  grossly normal neurologically.  Impression/Plan: Marie Nguyen is here for an colonoscopy to be performed for Screening colonoscopy average risk   Risks, benefits, limitations, and alternatives regarding  colonoscopy have been reviewed with the patient.  Questions have been answered.  All parties agreeable.   Jonathon Bellows, MD   11/24/2018, 9:24 AM

## 2018-11-24 NOTE — Anesthesia Preprocedure Evaluation (Signed)
Anesthesia Evaluation  Patient identified by MRN, date of birth, ID band Patient awake    Reviewed: Allergy & Precautions, H&P , NPO status , Patient's Chart, lab work & pertinent test results, reviewed documented beta blocker date and time   History of Anesthesia Complications Negative for: history of anesthetic complications  Airway Mallampati: II  TM Distance: >3 FB Neck ROM: full    Dental  (+) Dental Advidsory Given   Pulmonary neg pulmonary ROS,           Cardiovascular Exercise Tolerance: Good negative cardio ROS       Neuro/Psych negative neurological ROS  negative psych ROS   GI/Hepatic Neg liver ROS, GERD  ,  Endo/Other  negative endocrine ROS  Renal/GU negative Renal ROS  negative genitourinary   Musculoskeletal   Abdominal   Peds  Hematology negative hematology ROS (+)   Anesthesia Other Findings Past Medical History: No date: Allergy No date: GERD (gastroesophageal reflux disease)   Reproductive/Obstetrics negative OB ROS                             Anesthesia Physical Anesthesia Plan  ASA: II  Anesthesia Plan: General   Post-op Pain Management:    Induction: Intravenous  PONV Risk Score and Plan: 3 and Propofol infusion and TIVA  Airway Management Planned: Natural Airway and Nasal Cannula  Additional Equipment:   Intra-op Plan:   Post-operative Plan:   Informed Consent: I have reviewed the patients History and Physical, chart, labs and discussed the procedure including the risks, benefits and alternatives for the proposed anesthesia with the patient or authorized representative who has indicated his/her understanding and acceptance.     Dental Advisory Given  Plan Discussed with: Anesthesiologist, CRNA and Surgeon  Anesthesia Plan Comments:         Anesthesia Quick Evaluation

## 2018-11-24 NOTE — Anesthesia Postprocedure Evaluation (Signed)
Anesthesia Post Note  Patient: Marie Nguyen  Procedure(s) Performed: COLONOSCOPY WITH PROPOFOL (N/A )  Patient location during evaluation: Endoscopy Anesthesia Type: General Level of consciousness: awake and alert Pain management: pain level controlled Vital Signs Assessment: post-procedure vital signs reviewed and stable Respiratory status: spontaneous breathing, nonlabored ventilation, respiratory function stable and patient connected to nasal cannula oxygen Cardiovascular status: blood pressure returned to baseline and stable Postop Assessment: no apparent nausea or vomiting Anesthetic complications: no     Last Vitals:  Vitals:   11/24/18 1030 11/24/18 1040  BP: (!) 150/57 (!) 152/94  Pulse: 65 72  Resp: (!) 22 20  Temp:    SpO2: 99% 97%    Last Pain:  Vitals:   11/24/18 1040  TempSrc:   PainSc: 0-No pain                 Martha Clan

## 2018-11-24 NOTE — Anesthesia Post-op Follow-up Note (Signed)
Anesthesia QCDR form completed.        

## 2018-11-25 LAB — SURGICAL PATHOLOGY

## 2018-11-26 ENCOUNTER — Encounter: Payer: Self-pay | Admitting: Gastroenterology

## 2018-12-04 ENCOUNTER — Encounter: Payer: Self-pay | Admitting: Gastroenterology

## 2019-03-24 DIAGNOSIS — J0140 Acute pansinusitis, unspecified: Secondary | ICD-10-CM | POA: Diagnosis not present

## 2019-04-20 DIAGNOSIS — R0981 Nasal congestion: Secondary | ICD-10-CM | POA: Diagnosis not present

## 2019-04-20 DIAGNOSIS — Z20828 Contact with and (suspected) exposure to other viral communicable diseases: Secondary | ICD-10-CM | POA: Diagnosis not present

## 2019-05-06 DIAGNOSIS — J342 Deviated nasal septum: Secondary | ICD-10-CM | POA: Diagnosis not present

## 2019-05-06 DIAGNOSIS — J3489 Other specified disorders of nose and nasal sinuses: Secondary | ICD-10-CM | POA: Diagnosis not present

## 2019-05-06 DIAGNOSIS — T444X5A Adverse effect of predominantly alpha-adrenoreceptor agonists, initial encounter: Secondary | ICD-10-CM | POA: Diagnosis not present

## 2019-05-06 DIAGNOSIS — J019 Acute sinusitis, unspecified: Secondary | ICD-10-CM | POA: Diagnosis not present

## 2019-05-20 DIAGNOSIS — T444X5A Adverse effect of predominantly alpha-adrenoreceptor agonists, initial encounter: Secondary | ICD-10-CM | POA: Diagnosis not present

## 2019-05-20 DIAGNOSIS — R0981 Nasal congestion: Secondary | ICD-10-CM | POA: Diagnosis not present

## 2019-05-20 DIAGNOSIS — J019 Acute sinusitis, unspecified: Secondary | ICD-10-CM | POA: Diagnosis not present

## 2019-10-23 DIAGNOSIS — H6692 Otitis media, unspecified, left ear: Secondary | ICD-10-CM | POA: Diagnosis not present

## 2019-10-23 DIAGNOSIS — J0100 Acute maxillary sinusitis, unspecified: Secondary | ICD-10-CM | POA: Diagnosis not present

## 2020-05-04 DIAGNOSIS — Z20822 Contact with and (suspected) exposure to covid-19: Secondary | ICD-10-CM | POA: Diagnosis not present

## 2020-05-04 DIAGNOSIS — J0101 Acute recurrent maxillary sinusitis: Secondary | ICD-10-CM | POA: Diagnosis not present

## 2020-07-13 DIAGNOSIS — H6123 Impacted cerumen, bilateral: Secondary | ICD-10-CM | POA: Diagnosis not present

## 2020-09-06 DIAGNOSIS — Z20822 Contact with and (suspected) exposure to covid-19: Secondary | ICD-10-CM | POA: Diagnosis not present

## 2020-09-06 DIAGNOSIS — J0140 Acute pansinusitis, unspecified: Secondary | ICD-10-CM | POA: Diagnosis not present

## 2021-01-10 DIAGNOSIS — R030 Elevated blood-pressure reading, without diagnosis of hypertension: Secondary | ICD-10-CM | POA: Diagnosis not present

## 2021-01-10 DIAGNOSIS — J0100 Acute maxillary sinusitis, unspecified: Secondary | ICD-10-CM | POA: Diagnosis not present

## 2021-01-10 DIAGNOSIS — J301 Allergic rhinitis due to pollen: Secondary | ICD-10-CM | POA: Diagnosis not present

## 2021-01-10 DIAGNOSIS — Z20822 Contact with and (suspected) exposure to covid-19: Secondary | ICD-10-CM | POA: Diagnosis not present

## 2021-01-17 ENCOUNTER — Encounter: Payer: Self-pay | Admitting: Family Medicine

## 2021-01-17 ENCOUNTER — Other Ambulatory Visit: Payer: Self-pay

## 2021-01-17 ENCOUNTER — Ambulatory Visit (INDEPENDENT_AMBULATORY_CARE_PROVIDER_SITE_OTHER): Payer: BC Managed Care – PPO | Admitting: Family Medicine

## 2021-01-17 VITALS — BP 160/112 | HR 84 | Ht 67.0 in | Wt 152.0 lb

## 2021-01-17 DIAGNOSIS — Z7689 Persons encountering health services in other specified circumstances: Secondary | ICD-10-CM | POA: Diagnosis not present

## 2021-01-17 DIAGNOSIS — I10 Essential (primary) hypertension: Secondary | ICD-10-CM

## 2021-01-17 MED ORDER — HYDROCHLOROTHIAZIDE 12.5 MG PO TABS: 12.5000 mg | ORAL_TABLET | Freq: Every day | ORAL | 3 refills | Status: DC

## 2021-01-17 NOTE — Progress Notes (Signed)
Date:  01/17/2021   Name:  Marie Nguyen   DOB:  Jun 30, 1956   MRN:  [id number]   Chief Complaint: Marie Nguyen (Told has heart murmur x 2-3 years ago) and Hypertension (Has had elevated readings)  Hypertension This is a chronic problem. The current episode started more than 1 year ago. The problem has been waxing and waning since onset. The problem is controlled. Pertinent negatives include no anxiety, blurred vision, chest pain, headaches, malaise/fatigue, neck pain, orthopnea, palpitations, peripheral edema, PND, shortness of breath or sweats. Past treatments include nothing. The current treatment provides moderate improvement. There are no compliance problems.  There is no history of angina, kidney disease, CAD/MI, CVA, heart failure, left ventricular hypertrophy or retinopathy. There is no history of chronic renal disease, a hypertension causing med or renovascular disease.    Lab Results  Component Value Date   CREATININE 0.74 11/19/2018   BUN 9 11/19/2018   NA 144 11/19/2018   K 4.3 11/19/2018   CL 106 11/19/2018   CO2 24 11/19/2018   Lab Results  Component Value Date   CHOL 181 11/19/2018   HDL 65 11/19/2018   LDLCALC 101 (H) 11/19/2018   TRIG 75 11/19/2018   Lab Results  Component Value Date   TSH 2.810 11/19/2018   No results found for: HGBA1C Lab Results  Component Value Date   WBC 5.1 11/19/2018   HGB 14.7 11/19/2018   HCT 44.2 11/19/2018   MCV 94 11/19/2018   PLT 271 11/19/2018   Lab Results  Component Value Date   ALT 14 11/19/2018   AST 21 11/19/2018   ALKPHOS 60 11/19/2018   BILITOT 1.1 11/19/2018     Review of Systems  Constitutional: Negative.  Negative for chills, fatigue, fever, malaise/fatigue and unexpected weight change.  HENT: Negative for congestion, ear discharge, ear pain, rhinorrhea, sinus pressure, sneezing and sore throat.   Eyes: Negative for blurred vision, photophobia, pain, discharge, redness and itching.  Respiratory:  Negative for cough, shortness of breath, wheezing and stridor.   Cardiovascular: Negative for chest pain, palpitations, orthopnea and PND.  Gastrointestinal: Negative for abdominal pain, blood in stool, constipation, diarrhea, nausea and vomiting.  Endocrine: Negative for cold intolerance, heat intolerance, polydipsia, polyphagia and polyuria.  Genitourinary: Negative for dysuria, flank pain, frequency, hematuria, menstrual problem, pelvic pain, urgency, vaginal bleeding and vaginal discharge.  Musculoskeletal: Negative for arthralgias, back pain, myalgias and neck pain.  Skin: Negative for rash.  Allergic/Immunologic: Negative for environmental allergies and food allergies.  Neurological: Negative for dizziness, weakness, light-headedness, numbness and headaches.  Hematological: Negative for adenopathy. Does not bruise/bleed easily.  Psychiatric/Behavioral: Negative for dysphoric mood. The patient is not nervous/anxious.     Patient Active Problem List   Diagnosis Date Noted  . Skin nodule 06/30/2013    Allergies  Allergen Reactions  . Latex Rash    Past Surgical History:  Procedure Laterality Date  . ABDOMINAL HYSTERECTOMY    . CESAREAN SECTION    . COLONOSCOPY WITH PROPOFOL N/A 11/24/2018   Procedure: COLONOSCOPY WITH PROPOFOL;  Surgeon: Jonathon Bellows, MD;  Location: [facility] ENDOSCOPY;  Service: Gastroenterology;  Laterality: N/A;    Social History   Tobacco Use  . Smoking status: Never Smoker  . Smokeless tobacco: Never Used  Vaping Use  . Vaping Use: Never used  Substance Use Topics  . Alcohol use: Yes    Comment: occasional  . Drug use: No     Medication list has been reviewed and  updated.  Current Meds  Medication Sig  . cetirizine (ZYRTEC) 10 MG tablet Take 10 mg by mouth daily.  Marland Kitchen omeprazole (PRILOSEC) 20 MG capsule Take 20 mg by mouth daily.    PHQ 2/9 Scores 01/17/2021  PHQ - 2 Score 0  PHQ- 9 Score 0    GAD 7 : Generalized Anxiety Score 01/17/2021   Nervous, Anxious, on Edge 0  Control/stop worrying 1  Worry too much - different things 0  Trouble relaxing 0  Restless 0  Easily annoyed or irritable 0  Afraid - awful might happen 0  Total GAD 7 Score 1  Anxiety Difficulty Not difficult at all    BP Readings from Last 3 Encounters:  01/17/21 (!) 160/112  11/24/18 (!) 152/94  11/05/18 (!) 152/98    Physical Exam Vitals and nursing note reviewed.  Constitutional:      Appearance: She is well-developed.  HENT:     Head: Normocephalic.     Right Ear: Tympanic membrane, ear canal and external ear normal.     Left Ear: Tympanic membrane, ear canal and external ear normal.     Nose: Nose normal.     Mouth/Throat:     Mouth: Mucous membranes are moist.  Eyes:     General: Lids are everted, no foreign bodies appreciated. No scleral icterus.       Left eye: No foreign body or hordeolum.     Conjunctiva/sclera: Conjunctivae normal.     Right eye: Right conjunctiva is not injected.     Left eye: Left conjunctiva is not injected.     Pupils: Pupils are equal, round, and reactive to light.  Neck:     Thyroid: No thyromegaly.     Vascular: No JVD.     Trachea: No tracheal deviation.  Cardiovascular:     Rate and Rhythm: Normal rate and regular rhythm.     Heart sounds: S1 normal and S2 normal. Murmur heard.   Systolic murmur is present with a grade of 1/6.  No diastolic murmur is present. No friction rub. No gallop. No S3 or S4 sounds.   Pulmonary:     Effort: Pulmonary effort is normal. No respiratory distress.     Breath sounds: Normal breath sounds. No decreased breath sounds, wheezing, rhonchi or rales.  Abdominal:     General: Bowel sounds are normal.     Palpations: Abdomen is soft. There is no mass.     Tenderness: There is no abdominal tenderness. There is no guarding or rebound.  Musculoskeletal:        General: No tenderness. Normal range of motion.     Cervical back: Normal range of motion and neck supple.      Right lower leg: No edema.     Left lower leg: No edema.  Lymphadenopathy:     Cervical: No cervical adenopathy.  Skin:    General: Skin is warm.     Findings: No rash.  Neurological:     Mental Status: She is alert and oriented to person, place, and time.     Cranial Nerves: No cranial nerve deficit.     Deep Tendon Reflexes: Reflexes normal.  Psychiatric:        Mood and Affect: Mood is not anxious or depressed.     Wt Readings from Last 3 Encounters:  01/17/21 152 lb (68.9 kg)  11/24/18 140 lb (63.5 kg)  11/05/18 158 lb (71.7 kg)    BP (!) 160/112   Pulse 84  Ht 5\' 7"  (1.702 m)   Wt 152 lb (68.9 kg)   BMI 23.81 kg/m   Assessment and Plan: 1. Establishing care with new doctor, encounter for Sent establishing care with new physician.  Review of her past medical history is unremarkable.  Review of systems is normal in physical exam was normal.  2. Primary hypertension Chronic.  Persistent.  Uncontrolled.  Blood pressure today is 160/112.  This has been verified at other physician offices and we will initiate hydrochlorothiazide 12.5 mg once a day and will recheck in 6 weeks.  Patient is also being given a Dash diet and information on cooking with less salt. - hydrochlorothiazide (HYDRODIURIL) 12.5 MG tablet; Take 1 tablet (12.5 mg total) by mouth daily.  Dispense: 90 tablet; Refill: 3

## 2021-01-17 NOTE — Patient Instructions (Addendum)
How to Take Your Blood Pressure Blood pressure measures how strongly your blood is pressing against the walls of your arteries. Arteries are blood vessels that carry blood from your heart throughout your body. You can take your blood pressure at home with a machine. You may need to check your blood pressure at home:  To check if you have high blood pressure (hypertension).  To check your blood pressure over time.  To make sure your blood pressure medicine is working. Supplies needed:  Blood pressure machine, or monitor.  Dining room chair to sit in.  Table or desk.  Small notebook.  Pencil or pen. How to prepare Avoid these things for 30 minutes before checking your blood pressure:  Having drinks with caffeine in them, such as coffee or tea.  Drinking alcohol.  Eating.  Smoking.  Exercising. Do these things five minutes before checking your blood pressure:  Go to the bathroom and pee (urinate).  Sit in a dining chair. Do not sit in a soft couch or an armchair.  Be quiet. Do not talk. How to take your blood pressure Follow the instructions that came with your machine. If you have a digital blood pressure monitor, these may be the instructions: 1. Sit up straight. 2. Place your feet on the floor. Do not cross your ankles or legs. 3. Rest your left arm at the level of your heart. You may rest it on a table, desk, or chair. 4. Pull up your shirt sleeve. 5. Wrap the blood pressure cuff around the upper part of your left arm. The cuff should be 1 inch (2.5 cm) above your elbow. It is best to wrap the cuff around bare skin. 6. Fit the cuff snugly around your arm. You should be able to place only one finger between the cuff and your arm. 7. Place the cord so that it rests in the bend of your elbow. 8. Press the power button. 9. Sit quietly while the cuff fills with air and loses air. 10. Write down the numbers on the screen. 11. Wait 2-3 minutes and then repeat steps 1-10.    What do the numbers mean? Two numbers make up your blood pressure. The first number is called systolic pressure. The second is called diastolic pressure. An example of a blood pressure reading is "120 over 80" (or 120/80). If you are an adult and do not have a medical condition, use this guide to find out if your blood pressure is normal: Normal  First number: below 120.  Second number: below 80. Elevated  First number: 120-129.  Second number: below 80. Hypertension stage 1  First number: 130-139.  Second number: 80-89. Hypertension stage 2  First number: 140 or above.  Second number: 90 or above. Your blood pressure is above normal even if only the top or bottom number is above normal. Follow these instructions at home:  Check your blood pressure as often as your doctor tells you to.  Check your blood pressure at the same time every day.  Take your monitor to your next doctor's appointment. Your doctor will: ? Make sure you are using it correctly. ? Make sure it is working right.  Make sure you understand what your blood pressure numbers should be.  Tell your doctor if your medicine is causing side effects.  Keep all follow-up visits as told by your doctor. This is important. General tips:  You will need a blood pressure machine, or monitor. Your doctor can suggest a   monitor. You can buy one at a drugstore or online. When choosing one: ? Choose one with an arm cuff. ? Choose one that wraps around your upper arm. Only one finger should fit between your arm and the cuff. ? Do not choose one that measures your blood pressure from your wrist or finger. Where to find more information American Heart Association: www.heart.org Contact a doctor if:  Your blood pressure keeps being high. Get help right away if:  Your first blood pressure number is higher than 180.  Your second blood pressure number is higher than 120. Summary  Check your blood pressure at the same  time every day.  Avoid caffeine, alcohol, smoking, and exercise for 30 minutes before checking your blood pressure.  Make sure you understand what your blood pressure numbers should be. This information is not intended to replace advice given to you by your health care provider. Make sure you discuss any questions you have with your health care provider. Document Revised: 09/04/2019 Document Reviewed: 09/04/2019 Elsevier Patient Education  2021 [place] [employer] with less salt is one way to reduce the amount of sodium you get from food. Sodium is one of the elements that make up salt. It is found naturally in foods and is also added to certain foods. Depending on your condition and overall health, your health care provider or dietitian may recommend that you reduce your sodium intake. Most people should have less than 2,300 milligrams (mg) of sodium each day. If you have high blood pressure (hypertension), you may need to limit your sodium to 1,500 mg each day. Follow the tips below to help reduce your sodium intake. What are tips for eating less sodium? Reading food labels  Check the food label before buying or using packaged ingredients. Always check the label for the serving size and sodium content.  Look for products with no more than 140 mg of sodium in one serving.  Check the % Daily Value column to see what percent of the daily recommended amount of sodium is provided in one serving of the product. Foods with 5% or less in this column are considered low in sodium. Foods with 20% or higher are considered high in sodium.  Do not choose foods with salt as one of the first three ingredients on the ingredients list. If salt is one of the first three ingredients, it usually means the item is high in sodium.   Shopping  Buy sodium-free or low-sodium products. Look for the following words on food labels: ? Low-sodium. ? Sodium-free. ? Reduced-sodium. ? No salt  added. ? Unsalted.  Always check the sodium content even if foods are labeled as low-sodium or no salt added.  Buy fresh foods. Cooking  Use herbs, seasonings without salt, and spices as substitutes for salt.  Use sodium-free baking soda when baking.  Grill, braise, or roast foods to add flavor with less salt.  Avoid adding salt to pasta, rice, or hot cereals.  Drain and rinse canned vegetables, beans, and meat before use.  Avoid adding salt when cooking sweets and desserts.  Cook with low-sodium ingredients. What foods are high in sodium? Vegetables Regular canned vegetables (not low-sodium or reduced-sodium). Sauerkraut, pickled vegetables, and relishes. Olives. [place] fries. Onion rings. Regular canned tomato sauce and paste. Regular tomato and vegetable juice. Frozen vegetables in sauces. Grains Instant hot cereals. Bread stuffing, pancake, and biscuit mixes. Croutons. Seasoned rice or pasta mixes. Noodle soup cups. Boxed or  frozen macaroni and cheese. Regular salted crackers. Self-rising flour. Rolls. Bagels. Flour tortillas and wraps. Meats and other proteins Meat or fish that is salted, canned, smoked, cured, spiced, or pickled. This includes bacon, ham, sausages, hot dogs, corned beef, chipped beef, meat loaves, salt pork, jerky, pickled herring, anchovies, regular canned tuna, and sardines. Salted nuts. Dairy Processed cheese and cheese spreads. Cheese curds. Blue cheese. Feta cheese. String cheese. Regular cottage cheese. Buttermilk. Canned milk. The items listed above may not be a complete list of foods high in sodium. Actual amounts of sodium may be different depending on processing. Contact a dietitian for more information. What foods are low in sodium? Fruits Fresh, frozen, or canned fruit with no sauce added. Fruit juice. Vegetables Fresh or frozen vegetables with no sauce added. "No salt added" canned vegetables. "No salt added" tomato sauce and paste. Low-sodium or  reduced-sodium tomato and vegetable juice. Grains Noodles, pasta, quinoa, rice. Shredded or puffed wheat or puffed rice. Regular or quick oats (not instant). Low-sodium crackers. Low-sodium bread. Whole-grain bread and whole-grain pasta. Unsalted popcorn. Meats and other proteins Fresh or frozen whole meats, poultry (not injected with sodium), and fish with no sauce added. Unsalted nuts. Dried peas, beans, and lentils without added salt. Unsalted canned beans. Eggs. Unsalted nut butters. Low-sodium canned tuna or chicken. Dairy Milk. Soy milk. Yogurt. Low-sodium cheeses, such as Swiss, Monte[place] Jack, [place], and [employer]. Sherbet or ice cream (keep to  cup per serving). Cream cheese. Fats and oils Unsalted butter or margarine. Other foods Homemade pudding. Sodium-free baking soda and baking powder. Herbs and spices. Low-sodium seasoning mixes. Beverages Coffee and tea. Carbonated beverages. The items listed above may not be a complete list of foods low in sodium. Actual amounts of sodium may be different depending on processing. Contact a dietitian for more information. What are some salt alternatives when cooking? The following are herbs, seasonings, and spices that can be used instead of salt to flavor your food. Herbs should be fresh or dried. Do not choose packaged mixes. Next to the name of the herb, spice, or seasoning are some examples of foods you can pair it with. Herbs  Bay leaves - Soups, meat and vegetable dishes, and spaghetti sauce.  Basil - [employer], soups, pasta, and fish dishes.  Cilantro - Meat, poultry, and vegetable dishes.  Chili powder - Marinades and Mexican dishes.  Chives - Salad dressings and potato dishes.  Cumin - Mexican dishes, couscous, and meat dishes.  Dill - Fish dishes, sauces, and salads.  Fennel - Meat and vegetable dishes, breads, and cookies.  Garlic (do not use garlic salt) - [place] dishes, meat dishes, salad dressings, and  sauces.  Marjoram - Soups, potato dishes, and meat dishes.  Oregano - Pizza and spaghetti sauce.  Parsley - Salads, soups, pasta, and meat dishes.  Rosemary - [place] dishes, salad dressings, soups, and red meats.  Saffron - Fish dishes, pasta, and some poultry dishes.  Sage - Stuffings and sauces.  Tarragon - Fish and [employer].  Thyme - Stuffing, meat, and fish dishes. Seasonings  Lemon juice - Fish dishes, poultry dishes, vegetables, and salads.  Vinegar - Salad dressings, vegetables, and fish dishes. Spices  Cinnamon - Sweet dishes, such as cakes, cookies, and puddings.  Cloves - Gingerbread, puddings, and marinades for meats.  Curry - Vegetable dishes, fish and poultry dishes, and stir-fry dishes.  Ginger - Vegetable dishes, fish dishes, and stir-fry dishes.  Nutmeg - Pasta, vegetables, poultry, fish dishes, and  custard. Summary  Cooking with less salt is one way to reduce the amount of sodium that you get from food.  Buy sodium-free or low-sodium products.  Check the food label before using or buying packaged ingredients.  Use herbs, seasonings without salt, and spices as substitutes for salt in foods. This information is not intended to replace advice given to you by your health care provider. Make sure you discuss any questions you have with your health care provider. Document Revised: 09/02/2019 Document Reviewed: 09/02/2019 Elsevier Patient Education  2021 [place].  [email or web address]">  DASH Eating Plan DASH stands for Dietary Approaches to Stop Hypertension. The DASH eating plan is a healthy eating plan that has been shown to:  Reduce high blood pressure (hypertension).  Reduce your risk for type 2 diabetes, heart disease, and stroke.  Help with weight loss. What are tips for following this plan? Reading food labels  Check food labels for the amount of salt (sodium) per serving. Choose foods  with less than 5 percent of the Daily Value of sodium. Generally, foods with less than 300 milligrams (mg) of sodium per serving fit into this eating plan.  To find whole grains, look for the word "whole" as the first word in the ingredient list. Shopping  Buy products labeled as "low-sodium" or "no salt added."  Buy fresh foods. Avoid canned foods and pre-made or frozen meals. Cooking  Avoid adding salt when cooking. Use salt-free seasonings or herbs instead of table salt or sea salt. Check with your health care provider or pharmacist before using salt substitutes.  Do not fry foods. Cook foods using healthy methods such as baking, boiling, grilling, roasting, and broiling instead.  Cook with heart-healthy oils, such as olive, canola, avocado, soybean, or sunflower oil. Meal planning  Eat a balanced diet that includes: ? 4 or more servings of fruits and 4 or more servings of vegetables each day. Try to fill one-half of your plate with fruits and vegetables. ? 6-8 servings of whole grains each day. ? Less than 6 oz (170 g) of lean meat, poultry, or fish each day. A 3-oz (85-g) serving of meat is about the same size as a deck of cards. One egg equals 1 oz (28 g). ? 2-3 servings of low-fat dairy each day. One serving is 1 cup (237 mL). ? 1 serving of nuts, seeds, or beans 5 times each week. ? 2-3 servings of heart-healthy fats. Healthy fats called omega-3 fatty acids are found in foods such as walnuts, flaxseeds, fortified milks, and eggs. These fats are also found in cold-water fish, such as sardines, salmon, and mackerel.  Limit how much you eat of: ? Canned or prepackaged foods. ? Food that is high in trans fat, such as some fried foods. ? Food that is high in saturated fat, such as fatty meat. ? Desserts and other sweets, sugary drinks, and other foods with added sugar. ? Full-fat dairy products.  Do not salt foods before eating.  Do not eat more than 4 egg yolks a week.  Try to  eat at least 2 vegetarian meals a week.  Eat more home-cooked food and less restaurant, buffet, and fast food.   Lifestyle  When eating at a restaurant, ask that your food be prepared with less salt or no salt, if possible.  If you drink alcohol: ? Limit how much you use to:  0-1 drink a day for women who are not pregnant.  0-2 drinks a day for men. ?  Be aware of how much alcohol is in your drink. In the [place] one drink equals one 12 oz bottle of beer (355 mL), one 5 oz glass of wine (148 mL), or one 1 oz glass of hard liquor (44 mL). General information  Avoid eating more than 2,300 mg of salt a day. If you have hypertension, you may need to reduce your sodium intake to 1,500 mg a day.  Work with your health care provider to maintain a healthy body weight or to lose weight. Ask what an ideal weight is for you.  Get at least 30 minutes of exercise that causes your heart to beat faster (aerobic exercise) most days of the week. Activities may include walking, swimming, or biking.  Work with your health care provider or dietitian to adjust your eating plan to your individual calorie needs. What foods should I eat? Fruits All fresh, dried, or frozen fruit. Canned fruit in natural juice (without added sugar). Vegetables Fresh or frozen vegetables (raw, steamed, roasted, or grilled). Low-sodium or reduced-sodium tomato and vegetable juice. Low-sodium or reduced-sodium tomato sauce and tomato paste. Low-sodium or reduced-sodium canned vegetables. Grains Whole-grain or whole-wheat bread. Whole-grain or whole-wheat pasta. Brown rice. [place]. Bulgur. Whole-grain and low-sodium cereals. Pita bread. Low-fat, low-sodium crackers. Whole-wheat flour tortillas. Meats and other proteins Skinless chicken or [place]. Ground chicken or [place]. Pork with fat trimmed off. Fish and seafood. Egg whites. Dried beans, peas, or lentils. Unsalted nuts, nut butters, and seeds. Unsalted canned beans. Lean  cuts of beef with fat trimmed off. Low-sodium, lean precooked or cured meat, such as sausages or meat loaves. Dairy Low-fat (1%) or fat-free (skim) milk. Reduced-fat, low-fat, or fat-free cheeses. Nonfat, low-sodium ricotta or cottage cheese. Low-fat or nonfat yogurt. Low-fat, low-sodium cheese. Fats and oils Soft margarine without trans fats. Vegetable oil. Reduced-fat, low-fat, or light mayonnaise and salad dressings (reduced-sodium). Canola, safflower, olive, avocado, soybean, and sunflower oils. Avocado. Seasonings and condiments Herbs. Spices. Seasoning mixes without salt. Other foods Unsalted popcorn and pretzels. Fat-free sweets. The items listed above may not be a complete list of foods and beverages you can eat. Contact a dietitian for more information. What foods should I avoid? Fruits Canned fruit in a light or heavy syrup. Fried fruit. Fruit in cream or butter sauce. Vegetables Creamed or fried vegetables. Vegetables in a cheese sauce. Regular canned vegetables (not low-sodium or reduced-sodium). Regular canned tomato sauce and paste (not low-sodium or reduced-sodium). Regular tomato and vegetable juice (not low-sodium or reduced-sodium). Angie Fava. Olives. Grains Baked goods made with fat, such as croissants, muffins, or some breads. Dry pasta or rice meal packs. Meats and other proteins Fatty cuts of meat. Ribs. Fried meat. Berniece Salines. Bologna, salami, and other precooked or cured meats, such as sausages or meat loaves. Fat from the back of a pig (fatback). Bratwurst. Salted nuts and seeds. Canned beans with added salt. Canned or smoked fish. Whole eggs or egg yolks. Chicken or [place] with skin. Dairy Whole or 2% milk, cream, and half-and-half. Whole or full-fat cream cheese. Whole-fat or sweetened yogurt. Full-fat cheese. Nondairy creamers. Whipped toppings. Processed cheese and cheese spreads. Fats and oils Butter. Stick margarine. Lard. Shortening. Ghee. Bacon fat. Tropical oils, such  as coconut, palm kernel, or palm oil. Seasonings and condiments Onion salt, garlic salt, seasoned salt, table salt, and sea salt. Worcestershire sauce. Tartar sauce. Barbecue sauce. Teriyaki sauce. Soy sauce, including reduced-sodium. Steak sauce. Canned and packaged gravies. Fish sauce. Oyster sauce. Cocktail sauce. Store-bought horseradish. Ketchup. Mustard.  Meat flavorings and tenderizers. Bouillon cubes. Hot sauces. Pre-made or packaged marinades. Pre-made or packaged taco seasonings. Relishes. Regular salad dressings. Other foods Salted popcorn and pretzels. The items listed above may not be a complete list of foods and beverages you should avoid. Contact a dietitian for more information. Where to find more information  National Heart, Lung, and Blood Institute: https://wilson-eaton.com/  American Heart Association: www.heart.org  Academy of Nutrition and Dietetics: www.eatright.[place]: www.kidney.org Summary  The DASH eating plan is a healthy eating plan that has been shown to reduce high blood pressure (hypertension). It may also reduce your risk for type 2 diabetes, heart disease, and stroke.  When on the DASH eating plan, aim to eat more fresh fruits and vegetables, whole grains, lean proteins, low-fat dairy, and heart-healthy fats.  With the DASH eating plan, you should limit salt (sodium) intake to 2,300 mg a day. If you have hypertension, you may need to reduce your sodium intake to 1,500 mg a day.  Work with your health care provider or dietitian to adjust your eating plan to your individual calorie needs. This information is not intended to replace advice given to you by your health care provider. Make sure you discuss any questions you have with your health care provider. Document Revised: 08/14/2019 Document Reviewed: 08/14/2019 Elsevier Patient Education  2021 [employer].

## 2021-02-28 ENCOUNTER — Encounter: Payer: BC Managed Care – PPO | Admitting: Family Medicine

## 2021-03-07 ENCOUNTER — Encounter: Payer: BC Managed Care – PPO | Admitting: Family Medicine

## 2021-03-10 ENCOUNTER — Ambulatory Visit (INDEPENDENT_AMBULATORY_CARE_PROVIDER_SITE_OTHER): Payer: BC Managed Care – PPO | Admitting: Family Medicine

## 2021-03-10 ENCOUNTER — Encounter: Payer: Self-pay | Admitting: Family Medicine

## 2021-03-10 ENCOUNTER — Other Ambulatory Visit: Payer: Self-pay

## 2021-03-10 VITALS — BP 140/100 | HR 64 | Ht 67.0 in | Wt 150.0 lb

## 2021-03-10 DIAGNOSIS — I10 Essential (primary) hypertension: Secondary | ICD-10-CM

## 2021-03-10 DIAGNOSIS — N6459 Other signs and symptoms in breast: Secondary | ICD-10-CM

## 2021-03-10 DIAGNOSIS — Z0000 Encounter for general adult medical examination without abnormal findings: Secondary | ICD-10-CM

## 2021-03-10 DIAGNOSIS — Z1231 Encounter for screening mammogram for malignant neoplasm of breast: Secondary | ICD-10-CM

## 2021-03-10 DIAGNOSIS — K921 Melena: Secondary | ICD-10-CM | POA: Diagnosis not present

## 2021-03-10 MED ORDER — LISINOPRIL 10 MG PO TABS: 10.0000 mg | ORAL_TABLET | Freq: Every day | ORAL | 0 refills | Status: DC

## 2021-03-10 NOTE — Progress Notes (Signed)
Date:  03/10/2021   Name:  Marie Nguyen   DOB:  August 31, 1956   MRN:  [id number]   Chief Complaint: Annual Exam  Patient is a 65 year old female who presents for a comprehensive physical exam. The patient reports the following problems: elevated bp. Health maintenance has been reviewed needs mammogram.     Lab Results  Component Value Date   CREATININE 0.74 11/19/2018   BUN 9 11/19/2018   NA 144 11/19/2018   K 4.3 11/19/2018   CL 106 11/19/2018   CO2 24 11/19/2018   Lab Results  Component Value Date   CHOL 181 11/19/2018   HDL 65 11/19/2018   LDLCALC 101 (H) 11/19/2018   TRIG 75 11/19/2018   Lab Results  Component Value Date   TSH 2.810 11/19/2018   No results found for: HGBA1C Lab Results  Component Value Date   WBC 5.1 11/19/2018   HGB 14.7 11/19/2018   HCT 44.2 11/19/2018   MCV 94 11/19/2018   PLT 271 11/19/2018   Lab Results  Component Value Date   ALT 14 11/19/2018   AST 21 11/19/2018   ALKPHOS 60 11/19/2018   BILITOT 1.1 11/19/2018     Review of Systems  Constitutional:  Negative for chills and fever.  HENT:  Negative for drooling, ear discharge, ear pain and sore throat.   Respiratory:  Negative for cough, shortness of breath and wheezing.   Cardiovascular:  Negative for chest pain, palpitations and leg swelling.  Gastrointestinal:  Negative for abdominal pain, blood in stool, constipation, diarrhea and nausea.  Endocrine: Negative for polydipsia.  Genitourinary:  Negative for dysuria, frequency, hematuria and urgency.  Musculoskeletal:  Negative for back pain, myalgias and neck pain.  Skin:  Negative for rash.  Allergic/Immunologic: Negative for environmental allergies.  Neurological:  Negative for dizziness and headaches.  Hematological:  Does not bruise/bleed easily.  Psychiatric/Behavioral:  Negative for suicidal ideas. The patient is not nervous/anxious.    Patient Active Problem List   Diagnosis Date Noted   Skin nodule 06/30/2013     Allergies  Allergen Reactions   Latex Rash    Past Surgical History:  Procedure Laterality Date   ABDOMINAL HYSTERECTOMY     CESAREAN SECTION     COLONOSCOPY WITH PROPOFOL N/A 11/24/2018   Procedure: COLONOSCOPY WITH PROPOFOL;  Surgeon: Jonathon Bellows, MD;  Location: [facility] ENDOSCOPY;  Service: Gastroenterology;  Laterality: N/A;    Social History   Tobacco Use   Smoking status: Never   Smokeless tobacco: Never  Vaping Use   Vaping Use: Never used  Substance Use Topics   Alcohol use: Yes    Comment: occasional   Drug use: No     Medication list has been reviewed and updated.  Current Meds  Medication Sig   cetirizine (ZYRTEC) 10 MG tablet Take 10 mg by mouth daily.   hydrochlorothiazide (HYDRODIURIL) 12.5 MG tablet Take 1 tablet (12.5 mg total) by mouth daily.   omeprazole (PRILOSEC) 20 MG capsule Take 20 mg by mouth daily.   triamcinolone cream (KENALOG) 0.1 %     PHQ 2/9 Scores 03/10/2021 01/17/2021  PHQ - 2 Score 0 0  PHQ- 9 Score 0 0    GAD 7 : Generalized Anxiety Score 03/10/2021 01/17/2021  Nervous, Anxious, on Edge 0 0  Control/stop worrying 0 1  Worry too much - different things 0 0  Trouble relaxing 0 0  Restless 0 0  Easily annoyed or irritable 0 0  Afraid - awful might happen 0 0  Total GAD 7 Score 0 1  Anxiety Difficulty - Not difficult at all    BP Readings from Last 3 Encounters:  03/10/21 (!) 140/100  01/17/21 (!) 160/112  11/24/18 (!) 152/94    Physical Exam Vitals and nursing note reviewed.  Constitutional:      General: She is not in acute distress.    Appearance: She is well-developed, well-groomed and normal weight. She is not diaphoretic.  HENT:     Head: Normocephalic and atraumatic.     Jaw: There is normal jaw occlusion.     Right Ear: Hearing, tympanic membrane and external ear normal.     Left Ear: Hearing, tympanic membrane and external ear normal.     Nose: Nose normal.     Mouth/Throat:     Lips: Pink.     Mouth: Mucous  membranes are moist.     Dentition: Normal dentition.     Tongue: No lesions. Tongue does not deviate from midline.     Palate: No mass and lesions.  Eyes:     General: Lids are normal. Vision grossly intact. Gaze aligned appropriately. No visual field deficit.       Right eye: No foreign body or discharge.        Left eye: No foreign body or discharge.     Extraocular Movements: Extraocular movements intact.     Conjunctiva/sclera: Conjunctivae normal.     Pupils: Pupils are equal, round, and reactive to light.  Neck:     Thyroid: No thyroid mass, thyromegaly or thyroid tenderness.     Vascular: Normal carotid pulses. No carotid bruit, hepatojugular reflux or JVD.     Trachea: Trachea and phonation normal.  Cardiovascular:     Rate and Rhythm: Normal rate and regular rhythm.     Pulses: Normal pulses.          Carotid pulses are 2+ on the right side and 2+ on the left side.      Radial pulses are 2+ on the right side and 2+ on the left side.       Femoral pulses are 2+ on the right side and 2+ on the left side.      Popliteal pulses are 2+ on the right side and 2+ on the left side.       Dorsalis pedis pulses are 2+ on the right side and 2+ on the left side.       Posterior tibial pulses are 2+ on the right side and 2+ on the left side.     Heart sounds: Normal heart sounds, S1 normal and S2 normal. No murmur heard. No systolic murmur is present.  No diastolic murmur is present.    No friction rub. No gallop. No S3 or S4 sounds.  Pulmonary:     Effort: Pulmonary effort is normal.     Breath sounds: Normal breath sounds. No decreased breath sounds, wheezing or rhonchi.  Chest:     Chest wall: Deformity present.  Breasts:    Breasts are asymmetrical.     Right: No swelling, bleeding, inverted nipple, mass, nipple discharge, skin change, tenderness, axillary adenopathy or supraclavicular adenopathy.     Left: No swelling, bleeding, inverted nipple, mass, nipple discharge, skin  change, tenderness, axillary adenopathy or supraclavicular adenopathy.     Comments: Breast implant harden right/left firm implant not as large Abdominal:     General: Bowel sounds are normal.  Palpations: Abdomen is soft. There is no hepatomegaly, splenomegaly or mass.     Tenderness: There is no abdominal tenderness. There is no guarding.     Hernia: There is no hernia in the umbilical area or ventral area.  Genitourinary:    Rectum: Guaiac result positive. No mass or tenderness.  Musculoskeletal:        General: Normal range of motion.     Cervical back: Full passive range of motion without pain, normal range of motion and neck supple.     Right lower leg: No edema.     Left lower leg: No edema.  Lymphadenopathy:     Head:     Right side of head: No submandibular or tonsillar adenopathy.     Left side of head: No submandibular or tonsillar adenopathy.     Cervical: No cervical adenopathy.     Right cervical: No superficial, deep or posterior cervical adenopathy.    Left cervical: No superficial, deep or posterior cervical adenopathy.     Upper Body:     Right upper body: No supraclavicular or axillary adenopathy.     Left upper body: No supraclavicular or axillary adenopathy.  Skin:    General: Skin is warm and dry.     Capillary Refill: Capillary refill takes less than 2 seconds.     Coloration: Skin is not pale.  Neurological:     Mental Status: She is alert.     Cranial Nerves: Cranial nerves are intact.     Sensory: Sensation is intact.     Motor: Motor function is intact.     Deep Tendon Reflexes: Reflexes are normal and symmetric.     Reflex Scores:      Tricep reflexes are 2+ on the right side and 2+ on the left side.      Bicep reflexes are 2+ on the right side and 2+ on the left side.      Brachioradialis reflexes are 2+ on the right side and 2+ on the left side.      Patellar reflexes are 2+ on the right side and 2+ on the left side.      Achilles reflexes are  2+ on the right side and 2+ on the left side.   Wt Readings from Last 3 Encounters:  03/10/21 150 lb (68 kg)  01/17/21 152 lb (68.9 kg)  11/24/18 140 lb (63.5 kg)    BP (!) 140/100   Pulse 64   Ht 5\' 7"  (1.702 m)   Wt 150 lb (68 kg)   BMI 23.49 kg/m   Assessment and Plan: Marie Nguyen is a 65 y.o. female who presents today for her Complete Annual Exam. She feels well. She reports exercising occasional. She reports she is sleeping well.  Patient's chart was reviewed for previous encounters most recent labs most recent imaging as well as colonoscopy. 1. Annual physical exam Immunizations are reviewed and recommendations provided.   Age appropriate screening tests are discussed. Counseling given for risk factor reduction interventions.  No subjective/objective concerns noted other than implants that have hardened or may be extravasated on breast exam.  We will attempt to get a mammogram however this may require further evaluation.  We will obtain a CMP, CBC and lipid panel. - Comprehensive metabolic panel - CBC with Differential/Platelet - Lipid Panel With LDL/HDL Ratio  2. Primary hypertension Chronic.  Uncontrolled.  Stable.  Patient has had diastolic readings elevated the last 2 readings.  We will continue hydrochlorothiazide  at current dosing but we will add lisinopril 10 mg once a day.  Will check CMP for electrolytes and renal function. - lisinopril (ZESTRIL) 10 MG tablet; Take 1 tablet (10 mg total) by mouth daily.  Dispense: 90 tablet; Refill: 0 - Comprehensive metabolic panel  3. Hematochezia New onset.  On evaluation it was noted that there was some blood per rectum.  This was guaiac positive but we cannot be certain that this may be of an upper GI origin as well.  Patient has a history of tubular adenoma on the last colonoscopy about 2-1/2 years ago.  Will refer to gastroenterology for evaluation obtain CBC for current hemoglobin status.  - Ambulatory referral to  Gastroenterology - CBC with Differential/Platelet  4. Abnormal breast exam As noted patient has abnormal breast exam with implants that have hardened significantly on the right side and is firm but there is palpable breast tissue on the left.  We will attempt to get a mammogram and pending that evaluation may consider having surgery take a look for evaluation and possible removal of implants.  5. Breast cancer screening by mammogram Discussed with patient and screening with mammogram scheduled. - MM 3D SCREEN BREAST BILATERAL; Future - CBC with Differential/Platelet

## 2021-03-11 LAB — COMPREHENSIVE METABOLIC PANEL
ALT: 16 IU/L (ref 0–32)
AST: 18 IU/L (ref 0–40)
Albumin/Globulin Ratio: 2.1 (ref 1.2–2.2)
Albumin: 4.7 g/dL (ref 3.8–4.8)
Alkaline Phosphatase: 58 IU/L (ref 44–121)
BUN/Creatinine Ratio: 17 (ref 12–28)
BUN: 13 mg/dL (ref 8–27)
Bilirubin Total: 1.3 mg/dL — ABNORMAL HIGH (ref 0.0–1.2)
CO2: 24 mmol/L (ref 20–29)
Calcium: 9.9 mg/dL (ref 8.7–10.3)
Chloride: 102 mmol/L (ref 96–106)
Creatinine, Ser: 0.76 mg/dL (ref 0.57–1.00)
Globulin, Total: 2.2 g/dL (ref 1.5–4.5)
Glucose: 96 mg/dL (ref 65–99)
Potassium: 3.9 mmol/L (ref 3.5–5.2)
Sodium: 142 mmol/L (ref 134–144)
Total Protein: 6.9 g/dL (ref 6.0–8.5)
eGFR: 89 mL/min/{1.73_m2} (ref >59)

## 2021-03-11 LAB — LIPID PANEL WITH LDL/HDL RATIO
Cholesterol, Total: 210 mg/dL — ABNORMAL HIGH (ref 100–199)
HDL: 82 mg/dL (ref >39)
LDL Chol Calc (NIH): 114 mg/dL — ABNORMAL HIGH (ref 0–99)
LDL/HDL Ratio: 1.4 ratio (ref 0.0–3.2)
Triglycerides: 81 mg/dL (ref 0–149)
VLDL Cholesterol Cal: 14 mg/dL (ref 5–40)

## 2021-03-11 LAB — CBC WITH DIFFERENTIAL/PLATELET
Basophils Absolute: 0.1 10*3/uL (ref 0.0–0.2)
Basos: 2 %
EOS (ABSOLUTE): 0.1 10*3/uL (ref 0.0–0.4)
Eos: 2 %
Hematocrit: 42.9 % (ref 34.0–46.6)
Hemoglobin: 14.8 g/dL (ref 11.1–15.9)
Immature Grans (Abs): 0 10*3/uL (ref 0.0–0.1)
Immature Granulocytes: 0 %
Lymphocytes Absolute: 1.1 10*3/uL (ref 0.7–3.1)
Lymphs: 24 %
MCH: 32.3 pg (ref 26.6–33.0)
MCHC: 34.5 g/dL (ref 31.5–35.7)
MCV: 94 fL (ref 79–97)
Monocytes Absolute: 0.4 10*3/uL (ref 0.1–0.9)
Monocytes: 9 %
Neutrophils Absolute: 2.9 10*3/uL (ref 1.4–7.0)
Neutrophils: 63 %
Platelets: 247 10*3/uL (ref 150–450)
RBC: 4.58 x10E6/uL (ref 3.77–5.28)
RDW: 12.7 % (ref 11.7–15.4)
WBC: 4.6 10*3/uL (ref 3.4–10.8)

## 2021-03-21 ENCOUNTER — Other Ambulatory Visit: Payer: Self-pay

## 2021-03-21 ENCOUNTER — Ambulatory Visit
Admission: RE | Admit: 2021-03-21 | Discharge: 2021-03-21 | Disposition: A | Discharge status: Home / Self Care | Payer: BC Managed Care – PPO | Source: Ambulatory Visit | Attending: Family Medicine | Admitting: Family Medicine

## 2021-03-21 DIAGNOSIS — Z1231 Encounter for screening mammogram for malignant neoplasm of breast: Secondary | ICD-10-CM | POA: Diagnosis not present

## 2021-04-25 ENCOUNTER — Ambulatory Visit: Payer: BC Managed Care – PPO | Admitting: Family Medicine

## 2021-04-25 ENCOUNTER — Encounter: Payer: Self-pay | Admitting: Family Medicine

## 2021-04-25 ENCOUNTER — Other Ambulatory Visit: Payer: Self-pay

## 2021-04-25 VITALS — BP 120/88 | HR 64 | Ht 67.0 in | Wt 145.0 lb

## 2021-04-25 DIAGNOSIS — I10 Essential (primary) hypertension: Secondary | ICD-10-CM | POA: Diagnosis not present

## 2021-04-25 MED ORDER — LISINOPRIL 10 MG PO TABS: 10.0000 mg | ORAL_TABLET | Freq: Every day | ORAL | 0 refills | Status: DC

## 2021-04-25 MED ORDER — HYDROCHLOROTHIAZIDE 12.5 MG PO TABS: 12.5000 mg | ORAL_TABLET | Freq: Every day | ORAL | 3 refills | Status: DC

## 2021-04-25 MED ORDER — HYDROCHLOROTHIAZIDE 12.5 MG PO TABS: 12.5000 mg | ORAL_TABLET | Freq: Every day | ORAL | 1 refills | Status: DC

## 2021-04-25 MED ORDER — LISINOPRIL 10 MG PO TABS: 10.0000 mg | ORAL_TABLET | Freq: Every day | ORAL | 1 refills | Status: DC

## 2021-04-25 NOTE — Progress Notes (Signed)
Date:  04/25/2021   Name:  Marie Nguyen   DOB:  06/09/1956   MRN:  [id number]   Chief Complaint: Hypertension  Hypertension This is a chronic problem. The problem has been gradually improving since onset. The problem is controlled. Pertinent negatives include no anxiety, blurred vision, chest pain, headaches, malaise/fatigue, neck pain, orthopnea, palpitations, peripheral edema or shortness of breath. Past treatments include ACE inhibitors and diuretics. The current treatment provides moderate improvement. There are no compliance problems.  There is no history of angina, kidney disease, CAD/MI, CVA, heart failure, left ventricular hypertrophy, PVD or retinopathy. There is no history of chronic renal disease, a hypertension causing med or renovascular disease.   Lab Results  Component Value Date   CREATININE 0.76 03/10/2021   BUN 13 03/10/2021   NA 142 03/10/2021   K 3.9 03/10/2021   CL 102 03/10/2021   CO2 24 03/10/2021   Lab Results  Component Value Date   CHOL 210 (H) 03/10/2021   HDL 82 03/10/2021   LDLCALC 114 (H) 03/10/2021   TRIG 81 03/10/2021   Lab Results  Component Value Date   TSH 2.810 11/19/2018   No results found for: HGBA1C Lab Results  Component Value Date   WBC 4.6 03/10/2021   HGB 14.8 03/10/2021   HCT 42.9 03/10/2021   MCV 94 03/10/2021   PLT 247 03/10/2021   Lab Results  Component Value Date   ALT 16 03/10/2021   AST 18 03/10/2021   ALKPHOS 58 03/10/2021   BILITOT 1.3 (H) 03/10/2021     Review of Systems  Constitutional:  Negative for malaise/fatigue.  Eyes:  Negative for blurred vision.  Respiratory:  Negative for shortness of breath.   Cardiovascular:  Negative for chest pain, palpitations and orthopnea.  Musculoskeletal:  Negative for neck pain.  Neurological:  Negative for headaches.   Patient Active Problem List   Diagnosis Date Noted   Skin nodule 06/30/2013    Allergies  Allergen Reactions   Latex Rash    Past  Surgical History:  Procedure Laterality Date   ABDOMINAL HYSTERECTOMY     AUGMENTATION MAMMAPLASTY     BREAST BIOPSY Right    neg   CESAREAN SECTION     COLONOSCOPY WITH PROPOFOL N/A 11/24/2018   Procedure: COLONOSCOPY WITH PROPOFOL;  Surgeon: Jonathon Bellows, MD;  Location: [facility] ENDOSCOPY;  Service: Gastroenterology;  Laterality: N/A;    Social History   Tobacco Use   Smoking status: Never   Smokeless tobacco: Never  Vaping Use   Vaping Use: Never used  Substance Use Topics   Alcohol use: Yes    Comment: occasional   Drug use: No     Medication list has been reviewed and updated.  Current Meds  Medication Sig   cetirizine (ZYRTEC) 10 MG tablet Take 10 mg by mouth daily.   hydrochlorothiazide (HYDRODIURIL) 12.5 MG tablet Take 1 tablet (12.5 mg total) by mouth daily.   lisinopril (ZESTRIL) 10 MG tablet Take 1 tablet (10 mg total) by mouth daily.   omeprazole (PRILOSEC) 20 MG capsule Take 20 mg by mouth daily.   triamcinolone cream (KENALOG) 0.1 %     PHQ 2/9 Scores 03/10/2021 01/17/2021  PHQ - 2 Score 0 0  PHQ- 9 Score 0 0    GAD 7 : Generalized Anxiety Score 03/10/2021 01/17/2021  Nervous, Anxious, on Edge 0 0  Control/stop worrying 0 1  Worry too much - different things 0 0  Trouble relaxing 0 0  Restless 0 0  Easily annoyed or irritable 0 0  Afraid - awful might happen 0 0  Total GAD 7 Score 0 1  Anxiety Difficulty - Not difficult at all    BP Readings from Last 3 Encounters:  03/10/21 (!) 140/100  01/17/21 (!) 160/112  11/24/18 (!) 152/94    Physical Exam  Wt Readings from Last 3 Encounters:  04/25/21 145 lb (65.8 kg)  03/10/21 150 lb (68 kg)  01/17/21 152 lb (68.9 kg)    Ht '5\' 7"'$  (1.702 m)   Wt 145 lb (65.8 kg)   BMI 22.71 kg/m   Assessment and Plan:  1. Primary hypertension Chronic.  Controlled.  Stable.  Blood pressure 120/88.  We will continue lisinopril 10 mg once a day as well as hydrochlorothiazide 12.5 mg once a day.  And will recheck in 6  months.  Labs were reviewed from June and they are acceptable at this time and we will repeat them next year. - lisinopril (ZESTRIL) 10 MG tablet; Take 1 tablet (10 mg total) by mouth daily.  Dispense: 90 tablet; Refill: 1 - hydrochlorothiazide (HYDRODIURIL) 12.5 MG tablet; Take 1 tablet (12.5 mg total) by mouth daily.  Dispense: 90 tablet; Refill: 1

## 2021-05-04 ENCOUNTER — Ambulatory Visit: Payer: Self-pay | Admitting: Gastroenterology

## 2021-05-25 ENCOUNTER — Other Ambulatory Visit: Payer: Self-pay

## 2021-05-25 ENCOUNTER — Encounter: Payer: Self-pay | Admitting: Gastroenterology

## 2021-05-25 ENCOUNTER — Ambulatory Visit: Payer: BC Managed Care – PPO | Admitting: Gastroenterology

## 2021-05-25 VITALS — BP 165/95 | HR 67 | Temp 98.7°F | Ht 67.0 in | Wt 145.0 lb

## 2021-05-25 DIAGNOSIS — Z86010 Personal history of colonic polyps: Secondary | ICD-10-CM | POA: Diagnosis not present

## 2021-05-25 DIAGNOSIS — K648 Other hemorrhoids: Secondary | ICD-10-CM

## 2021-05-25 NOTE — Progress Notes (Signed)
Jonathon Bellows MD, MRCP(U.K) [address]  [place]  [place], [place] [address]  Main: [phone]  Fax: [phone]   Gastroenterology Consultation  Referring Provider:     Juline Patch, MD Primary Care Physician:  Juline Patch, MD Primary Gastroenterologist:  Dr. Jonathon Bellows  Reason for Consultation:     Hematochezia        HPI:   Marie Nguyen is a 65 y.o. y/o female referred for consultation & management  by Dr. Ronnald Ramp, Iven Finn, MD.    She denies any blood in her stool but she has seen with the group.  She sees cardiology Dr.-Dr. Lorie Apley that she underwent a rectal exam by Dr. Ronnald Ramp.  Denies any perianal itching. 03/10/2021: Hb 14.8 grams 11/24/2018: Colonoscopy :15 mm cecal polyp -tubular adenoma resected and large internal hemorrhoids seen.   Past Medical History:  Diagnosis Date   Allergy    Eczema    GERD (gastroesophageal reflux disease)    Heart murmur     Past Surgical History:  Procedure Laterality Date   ABDOMINAL HYSTERECTOMY     AUGMENTATION MAMMAPLASTY     BREAST BIOPSY Right    neg   CESAREAN SECTION     COLONOSCOPY WITH PROPOFOL N/A 11/24/2018   Procedure: COLONOSCOPY WITH PROPOFOL;  Surgeon: Jonathon Bellows, MD;  Location: [facility] ENDOSCOPY;  Service: Gastroenterology;  Laterality: N/A;    Prior to Admission medications   Medication Sig Start Date End Date Taking? Authorizing Provider  cetirizine (ZYRTEC) 10 MG tablet Take 10 mg by mouth daily.    [provider]  hydrochlorothiazide (HYDRODIURIL) 12.5 MG tablet Take 1 tablet (12.5 mg total) by mouth daily. 04/25/21   Juline Patch, MD  lisinopril (ZESTRIL) 10 MG tablet Take 1 tablet (10 mg total) by mouth daily. 04/25/21   Juline Patch, MD  omeprazole (PRILOSEC) 20 MG capsule Take 20 mg by mouth daily.    [provider]  triamcinolone cream (KENALOG) 0.1 %  04/10/17   [provider]    Family History  Problem Relation Age of Onset   Cancer Mother     Hypertension Mother    Diabetes Father    Hypertension Father    Stroke Father    Hypertension Sister    Cancer Maternal Grandfather    Breast cancer Neg Hx      Social History   Tobacco Use   Smoking status: Never   Smokeless tobacco: Never  Vaping Use   Vaping Use: Never used  Substance Use Topics   Alcohol use: Yes    Comment: occasional   Drug use: No    Allergies as of 05/25/2021 - Review Complete 05/25/2021  Allergen Reaction Noted   Latex Rash 06/30/2013    Review of Systems:    All systems reviewed and negative except where noted in HPI.   Physical Exam:  BP (!) 165/95   Pulse 67   Temp 98.7 F (37.1 C) (Oral)   Ht '5\' 7"'$  (1.702 m)   Wt 145 lb (65.8 kg)   BMI 22.71 kg/m  No LMP recorded. Patient has had a hysterectomy. Psych:  Alert and cooperative. Normal mood and affect. General:   Alert,  Well-developed, well-nourished, pleasant and cooperative in NAD Head:  Normocephalic and atraumatic. Eyes:  Sclera clear, no icterus.   Conjunctiva pink. Neurologic:  Alert and oriented x3;  grossly normal neurologically. Psych:  Alert and cooperative. Normal mood and affect.  Imaging Studies: No  results found.  Assessment and Plan:   Marie Nguyen is a 65 y.o. y/o female has been referred for blood in stool with a normal hemoglobin.  Blood in the stool was noted after the rectal exam.  Patient denies any blood that she has noticed herself in the stool.  She had a large polyp resected in the cecum via EMR almost 3 years back and is due for a surveillance colonoscopy.  She was also found to have internal hemorrhoids during the last colonoscopy.  Very likely the bleeding is internal hemorrhoids.  I will set her up for a colonoscopy.  I have discussed alternative options, risks & benefits,  which include, but are not limited to, bleeding, infection, perforation,respiratory complication & drug reaction.  The patient agrees with this plan & written consent will be  obtained.     Follow up in  as needed  Dr Jonathon Bellows MD,MRCP(U.K)

## 2021-06-09 DIAGNOSIS — B029 Zoster without complications: Secondary | ICD-10-CM | POA: Diagnosis not present

## 2021-06-26 DIAGNOSIS — L814 Other melanin hyperpigmentation: Secondary | ICD-10-CM | POA: Diagnosis not present

## 2021-06-26 DIAGNOSIS — L710 Perioral dermatitis: Secondary | ICD-10-CM | POA: Diagnosis not present

## 2021-06-28 DIAGNOSIS — H04121 Dry eye syndrome of right lacrimal gland: Secondary | ICD-10-CM | POA: Diagnosis not present

## 2021-06-28 DIAGNOSIS — H5210 Myopia, unspecified eye: Secondary | ICD-10-CM | POA: Diagnosis not present

## 2021-06-28 DIAGNOSIS — H1045 Other chronic allergic conjunctivitis: Secondary | ICD-10-CM | POA: Diagnosis not present

## 2021-07-03 ENCOUNTER — Ambulatory Visit: Payer: BC Managed Care – PPO | Admitting: Certified Registered Nurse Anesthetist

## 2021-07-03 ENCOUNTER — Ambulatory Visit
Admission: RE | Admit: 2021-07-03 | Discharge: 2021-07-03 | Disposition: A | Discharge status: Home / Self Care | Payer: BC Managed Care – PPO | Attending: Gastroenterology | Admitting: Gastroenterology

## 2021-07-03 ENCOUNTER — Encounter
Admission: RE | Disposition: A | Discharge status: Home / Self Care | Payer: Self-pay | Source: Home / Self Care | Attending: Gastroenterology

## 2021-07-03 DIAGNOSIS — Z09 Encounter for follow-up examination after completed treatment for conditions other than malignant neoplasm: Secondary | ICD-10-CM | POA: Insufficient documentation

## 2021-07-03 DIAGNOSIS — K640 First degree hemorrhoids: Secondary | ICD-10-CM | POA: Insufficient documentation

## 2021-07-03 DIAGNOSIS — Z86010 Personal history of colonic polyps: Secondary | ICD-10-CM | POA: Diagnosis not present

## 2021-07-03 DIAGNOSIS — K635 Polyp of colon: Secondary | ICD-10-CM | POA: Diagnosis not present

## 2021-07-03 DIAGNOSIS — Z1211 Encounter for screening for malignant neoplasm of colon: Secondary | ICD-10-CM | POA: Diagnosis not present

## 2021-07-03 DIAGNOSIS — D126 Benign neoplasm of colon, unspecified: Secondary | ICD-10-CM

## 2021-07-03 DIAGNOSIS — Z809 Family history of malignant neoplasm, unspecified: Secondary | ICD-10-CM | POA: Insufficient documentation

## 2021-07-03 HISTORY — PX: COLONOSCOPY WITH PROPOFOL: SHX5780

## 2021-07-03 SURGERY — COLONOSCOPY WITH PROPOFOL
Anesthesia: Monitor Anesthesia Care

## 2021-07-03 MED ORDER — SODIUM CHLORIDE 0.9 % IV SOLN
INTRAVENOUS | Status: DC
  Administered 2021-07-03: 20 mL/h via INTRAVENOUS

## 2021-07-03 MED ORDER — PROPOFOL 500 MG/50ML IV EMUL
INTRAVENOUS | Status: AC
  Filled 2021-07-03: qty 150

## 2021-07-03 MED ORDER — LIDOCAINE HCL (CARDIAC) PF 100 MG/5ML IV SOSY
PREFILLED_SYRINGE | INTRAVENOUS | Status: DC | PRN
  Administered 2021-07-03: 100 mg via INTRAVENOUS

## 2021-07-03 MED ORDER — GLYCOPYRROLATE 0.2 MG/ML IJ SOLN
INTRAMUSCULAR | Status: AC
  Filled 2021-07-03: qty 1

## 2021-07-03 MED ORDER — PROPOFOL 500 MG/50ML IV EMUL
INTRAVENOUS | Status: AC
  Filled 2021-07-03: qty 100

## 2021-07-03 MED ORDER — LABETALOL HCL 5 MG/ML IV SOLN
INTRAVENOUS | Status: AC
  Filled 2021-07-03: qty 4

## 2021-07-03 MED ORDER — PROPOFOL 500 MG/50ML IV EMUL
INTRAVENOUS | Status: DC | PRN
  Administered 2021-07-03: 140 ug/kg/min via INTRAVENOUS

## 2021-07-03 MED ORDER — LIDOCAINE HCL (PF) 2 % IJ SOLN
INTRAMUSCULAR | Status: AC
  Filled 2021-07-03: qty 40

## 2021-07-03 MED ORDER — PROPOFOL 10 MG/ML IV BOLUS
INTRAVENOUS | Status: DC | PRN
  Administered 2021-07-03: 20 mg via INTRAVENOUS
  Administered 2021-07-03: 60 mg via INTRAVENOUS
  Administered 2021-07-03: 20 mg via INTRAVENOUS

## 2021-07-03 MED ORDER — LABETALOL HCL 5 MG/ML IV SOLN: INTRAVENOUS | Status: DC | PRN

## 2021-07-03 NOTE — H&P (Signed)
Marie Bellows, MD [address] [place], [place], [place], [address] [address], [place], [place], [place], [address] Phone: [phone]  Fax: [phone]  Primary Care Physician:  Juline Patch, MD   Pre-Procedure History & Physical: HPI:  Marie Nguyen is a 65 y.o. female is here for an colonoscopy.   Past Medical History:  Diagnosis Date   Allergy    Eczema    GERD (gastroesophageal reflux disease)    Heart murmur     Past Surgical History:  Procedure Laterality Date   ABDOMINAL HYSTERECTOMY     AUGMENTATION MAMMAPLASTY     BREAST BIOPSY Right    neg   CESAREAN SECTION     COLONOSCOPY WITH PROPOFOL N/A 11/24/2018   Procedure: COLONOSCOPY WITH PROPOFOL;  Surgeon: Marie Bellows, MD;  Location: [facility] ENDOSCOPY;  Service: Gastroenterology;  Laterality: N/A;    Prior to Admission medications   Medication Sig Start Date End Date Taking? Authorizing Provider  cetirizine (ZYRTEC) 10 MG tablet Take 10 mg by mouth daily.   Yes [provider]  hydrochlorothiazide (HYDRODIURIL) 12.5 MG tablet Take 1 tablet (12.5 mg total) by mouth daily. 04/25/21  Yes Juline Patch, MD  omeprazole (PRILOSEC) 20 MG capsule Take 20 mg by mouth daily.   Yes [provider]  triamcinolone cream (KENALOG) 0.1 %  04/10/17  Yes [provider]  lisinopril (ZESTRIL) 10 MG tablet Take 1 tablet (10 mg total) by mouth daily. Patient not taking: Reported on 07/03/2021 04/25/21   Juline Patch, MD    Allergies as of 05/25/2021 - Review Complete 05/25/2021  Allergen Reaction Noted   Latex Rash 06/30/2013    Family History  Problem Relation Age of Onset   Cancer Mother    Hypertension Mother    Diabetes Father    Hypertension Father    Stroke Father    Hypertension Sister    Cancer Maternal Grandfather    Breast cancer Neg Hx     Social History   Socioeconomic History   Marital status: Divorced    Spouse name: Not on file   Number of children: Not on file    Years of education: Not on file   Highest education level: Not on file  Occupational History   Not on file  Tobacco Use   Smoking status: Never   Smokeless tobacco: Never  Vaping Use   Vaping Use: Never used  Substance and Sexual Activity   Alcohol use: Yes    Comment: occasional   Drug use: No   Sexual activity: Yes  Other Topics Concern   Not on file  Social History Narrative   Not on file   Social Determinants of Health   Financial Resource Strain: Not on file  Food Insecurity: Not on file  Transportation Needs: Not on file  Physical Activity: Not on file  Stress: Not on file  Social Connections: Not on file  Intimate Partner Violence: Not on file    Review of Systems: See HPI, otherwise negative ROS  Physical Exam: BP (!) 156/86   Pulse (!) 58   Temp 98.1 F (36.7 C) (Temporal)   Resp 20   Ht 5\' 7"  (1.702 m)   Wt 63.5 kg   SpO2 99%   BMI 21.93 kg/m  General:   Alert,  pleasant and cooperative in NAD Head:  Normocephalic and atraumatic. Neck:  Supple; no masses or thyromegaly. Lungs:  Clear throughout to auscultation, normal respiratory effort.    Heart:  +  S1, +S2, Regular rate and rhythm, No edema. Abdomen:  Soft, nontender and nondistended. Normal bowel sounds, without guarding, and without rebound.   Neurologic:  Alert and  oriented x4;  grossly normal neurologically.  Impression/Plan: Marie Nguyen is here for an colonoscopy to be performed for surveillance due to prior history of colon polyps   Risks, benefits, limitations, and alternatives regarding  colonoscopy have been reviewed with the patient.  Questions have been answered.  All parties agreeable.   Marie Bellows, MD  07/03/2021, 7:48 AM

## 2021-07-03 NOTE — Anesthesia Postprocedure Evaluation (Addendum)
Anesthesia Post Note  Patient: Marie Nguyen  Procedure(s) Performed: COLONOSCOPY WITH PROPOFOL  Patient location during evaluation: Endoscopy Anesthesia Type: MAC Level of consciousness: awake and alert Pain management: pain level controlled Vital Signs Assessment: post-procedure vital signs reviewed and stable Respiratory status: spontaneous breathing, nonlabored ventilation, respiratory function stable and patient connected to nasal cannula oxygen Cardiovascular status: stable and blood pressure returned to baseline Postop Assessment: no apparent nausea or vomiting Anesthetic complications: no   No notable events documented.   Last Vitals:  Vitals:   07/03/21 0813 07/03/21 0823  BP: 122/82 (!) 143/88  Pulse: 61   Resp: 17 (!) 22  Temp: (!) 36.3 C   SpO2: 96% 96%    Last Pain:  Vitals:   07/03/21 0823  TempSrc:   PainSc: 0-No pain                 Tonny Bollman

## 2021-07-03 NOTE — Anesthesia Preprocedure Evaluation (Signed)
Anesthesia Evaluation  Patient identified by MRN, date of birth, ID band Patient awake    Reviewed: Allergy & Precautions, NPO status , Patient's Chart, lab work & pertinent test results  History of Anesthesia Complications Negative for: history of anesthetic complications  Airway Mallampati: II  TM Distance: >3 FB Neck ROM: Full    Dental no notable dental hx. (+) Teeth Intact   Pulmonary neg pulmonary ROS,    Pulmonary exam normal breath sounds clear to auscultation       Cardiovascular Exercise Tolerance: Good METS: 3 - Mets hypertension (controlled), Pt. on medications Normal cardiovascular exam+ Valvular Problems/Murmurs MVP  Rhythm:Regular Rate:Normal     Neuro/Psych negative neurological ROS  negative psych ROS   GI/Hepatic Neg liver ROS, GERD  Controlled and Medicated,  Endo/Other  negative endocrine ROS  Renal/GU negative Renal ROS  negative genitourinary   Musculoskeletal negative musculoskeletal ROS (+)   Abdominal   Peds  Hematology negative hematology ROS (+)   Anesthesia Other Findings   Reproductive/Obstetrics negative OB ROS                             Anesthesia Physical Anesthesia Plan  ASA: 2  Anesthesia Plan: MAC   Post-op Pain Management:    Induction: Intravenous  PONV Risk Score and Plan: 2 and TIVA  Airway Management Planned: Natural Airway and Nasal Cannula  Additional Equipment:   Intra-op Plan:   Post-operative Plan:   Informed Consent: I have reviewed the patients History and Physical, chart, labs and discussed the procedure including the risks, benefits and alternatives for the proposed anesthesia with the patient or authorized representative who has indicated his/her understanding and acceptance.     Dental Advisory Given  Plan Discussed with: Anesthesiologist, CRNA and Surgeon  Anesthesia Plan Comments: (Patient consented for risks of  anesthesia including but not limited to:  - adverse reactions to medications - damage to eyes, teeth, lips or other oral mucosa - nerve damage due to positioning  - sore throat or hoarseness - Damage to heart, brain, nerves, lungs, other parts of body or loss of life  Patient voiced understanding.)        Anesthesia Quick Evaluation

## 2021-07-03 NOTE — Transfer of Care (Signed)
Immediate Anesthesia Transfer of Care Note  Patient: Marie Nguyen  Procedure(s) Performed: COLONOSCOPY WITH PROPOFOL  Patient Location: Endoscopy Unit  Anesthesia Type:General  Level of Consciousness: drowsy  Airway & Oxygen Therapy: Patient Spontanous Breathing  Post-op Assessment: Report given to RN and Post -op Vital signs reviewed and stable  Post vital signs: Reviewed and stable  Last Vitals:  Vitals Value Taken Time  BP 122/82   Temp    Pulse 62   Resp 16   SpO2 97     Last Pain:  Vitals:   07/03/21 0713  TempSrc: Temporal  PainSc: 0-No pain         Complications: No notable events documented.

## 2021-07-03 NOTE — Op Note (Signed)
[facility] Patient Name: Marie Nguyen Procedure Date: 07/03/2021 7:43 AM MRN: [id number] Account #: [id number] Date of Birth: Jun 24, 1956 Admit Type: Outpatient Age: 65 Room: [facility] ENDO ROOM 4 Gender: Female Note Status: Finalized Instrument Name: Jasper Riling [id number] Procedure:             Colonoscopy Indications:           Surveillance: Piecemeal removal of large sessile                         adenoma last colonoscopy (< 3 yrs), Last colonoscopy:                         March 2020 Providers:             Jonathon Bellows MD, MD Referring MD:          Juline Patch, MD (Referring MD) Medicines:             Monitored Anesthesia Care Complications:         No immediate complications. Procedure:             Pre-Anesthesia Assessment:                        - Prior to the procedure, a History and Physical was                         performed, and patient medications, allergies and                         sensitivities were reviewed. The patient's tolerance                         of previous anesthesia was reviewed.                        - The risks and benefits of the procedure and the                         sedation options and risks were discussed with the                         patient. All questions were answered and informed                         consent was obtained.                        - ASA Grade Assessment: II - A patient with mild                         systemic disease.                        After obtaining informed consent, the colonoscope was                         passed under direct vision. Throughout the procedure,                         the patient's blood  pressure, pulse, and oxygen                         saturations were monitored continuously. The                         Colonoscope was introduced through the anus and                         advanced to the the cecum, identified by the                          appendiceal orifice. The colonoscopy was performed                         with ease. The patient tolerated the procedure well.                         The quality of the bowel preparation was excellent. Findings:      The perianal and digital rectal examinations were normal.      A 5 mm polyp was found in the cecum. The polyp was sessile. The polyp       was removed with a cold snare. Resection and retrieval were complete.      The exam was otherwise without abnormality on direct and retroflexion       views.      Non-bleeding internal hemorrhoids were found during retroflexion. The       hemorrhoids were large and Grade I (internal hemorrhoids that do not       prolapse). Impression:            - One 5 mm polyp in the cecum, removed with a cold                         snare. Resected and retrieved.                        - The examination was otherwise normal on direct and                         retroflexion views. Recommendation:        - Discharge patient to home (with escort).                        - Resume previous diet.                        - Continue present medications.                        - Await pathology results.                        - Repeat colonoscopy for surveillance based on                         pathology results.                        - Return to GI office PRN. Procedure Code(s):     --- Professional ---  45385, Colonoscopy, flexible; with removal of                         tumor(s), polyp(s), or other lesion(s) by snare                         technique Diagnosis Code(s):     --- Professional ---                        Z86.010, Personal history of colonic polyps                        K63.5, Polyp of colon CPT copyright 2019 American Medical Association. All rights reserved. The codes documented in this report are preliminary and upon coder review may  be revised to meet current compliance requirements. Jonathon Bellows, MD Jonathon Bellows  MD, MD 07/03/2021 8:13:12 AM This report has been signed electronically. Number of Addenda: 0 Note Initiated On: 07/03/2021 7:43 AM Scope Withdrawal Time: 0 hours 11 minutes 22 seconds  Total Procedure Duration: 0 hours 15 minutes 29 seconds  Estimated Blood Loss:  Estimated blood loss: none. Estimated blood loss: none.      [facility]

## 2021-07-03 NOTE — Anesthesia Procedure Notes (Signed)
Date/Time: 07/03/2021 7:56 AM Performed by: Lily Peer, Hezzie Karim, CRNA Pre-anesthesia Checklist: Patient identified, Emergency Drugs available, Suction available, Patient being monitored and Timeout performed Patient Re-evaluated:Patient Re-evaluated prior to induction Oxygen Delivery Method: Nasal cannula Induction Type: IV induction

## 2021-07-04 ENCOUNTER — Encounter: Payer: Self-pay | Admitting: Gastroenterology

## 2021-07-04 LAB — SURGICAL PATHOLOGY

## 2021-07-10 ENCOUNTER — Encounter: Payer: Self-pay | Admitting: Gastroenterology

## 2021-08-25 ENCOUNTER — Ambulatory Visit: Payer: Self-pay

## 2021-08-25 NOTE — Telephone Encounter (Signed)
Noted  KP 

## 2021-08-25 NOTE — Telephone Encounter (Signed)
Pt. Started having lower abdominal pain Monday. Radiates to right hip. Comes and goes. Pain today 8/10. "Seems to hurt worse when I move . I have not slept well all week." No availability in the practice today. Instructed to go to UC, refuse. States "I can wait until next week." Appointment made. Instructed to go to UC/ED for worsening of symptoms. Verbalizes understanding.     Reason for Disposition  [1] MILD-MODERATE pain AND [2] constant AND [3] present > 2 hours  Answer Assessment - Initial Assessment Questions 1. LOCATION: "Where does it hurt?"      Front to back 2. RADIATION: "Does the pain shoot anywhere else?" (e.g., chest, back)     Right hip 3. ONSET: "When did the pain begin?" (e.g., minutes, hours or days ago)      Monday 4. SUDDEN: "Gradual or sudden onset?"     Sudden 5. PATTERN "Does the pain come and go, or is it constant?"    - If constant: "Is it getting better, staying the same, or worsening?"      (Note: Constant means the pain never goes away completely; most serious pain is constant and it progresses)     - If intermittent: "How long does it last?" "Do you have pain now?"     (Note: Intermittent means the pain goes away completely between bouts)     Comes and goes 6. SEVERITY: "How bad is the pain?"  (e.g., Scale 1-10; mild, moderate, or severe)   - MILD (1-3): doesn't interfere with normal activities, abdomen soft and not tender to touch    - MODERATE (4-7): interferes with normal activities or awakens from sleep, abdomen tender to touch    - SEVERE (8-10): excruciating pain, doubled over, unable to do any normal activities      Now - 8 7. RECURRENT SYMPTOM: "Have you ever had this type of stomach pain before?" If Yes, ask: "When was the last time?" and "What happened that time?"      No 8. CAUSE: "What do you think is causing the stomach pain?"     Unsure 9. RELIEVING/AGGRAVATING FACTORS: "What makes it better or worse?" (e.g., movement, antacids, bowel  movement)     Sitting 10. OTHER SYMPTOMS: "Do you have any other symptoms?" (e.g., back pain, diarrhea, fever, urination pain, vomiting)       Hip pain 11. PREGNANCY: "Is there any chance you are pregnant?" "When was your last menstrual period?"       No  Protocols used: Abdominal Pain - [facility]

## 2021-08-29 ENCOUNTER — Ambulatory Visit: Payer: BC Managed Care – PPO | Admitting: Family Medicine

## 2021-08-29 ENCOUNTER — Ambulatory Visit
Admission: RE | Admit: 2021-08-29 | Discharge: 2021-08-29 | Disposition: A | Discharge status: Home / Self Care | Payer: BC Managed Care – PPO | Source: Ambulatory Visit | Attending: Family Medicine | Admitting: Family Medicine

## 2021-08-29 ENCOUNTER — Encounter: Payer: Self-pay | Admitting: Family Medicine

## 2021-08-29 ENCOUNTER — Ambulatory Visit
Admission: RE | Admit: 2021-08-29 | Discharge: 2021-08-29 | Disposition: A | Discharge status: Home / Self Care | Payer: BC Managed Care – PPO | Attending: Family Medicine | Admitting: Family Medicine

## 2021-08-29 ENCOUNTER — Other Ambulatory Visit: Payer: Self-pay

## 2021-08-29 VITALS — BP 134/90 | HR 64 | Ht 67.0 in | Wt 149.0 lb

## 2021-08-29 DIAGNOSIS — M545 Low back pain, unspecified: Secondary | ICD-10-CM | POA: Insufficient documentation

## 2021-08-29 DIAGNOSIS — M419 Scoliosis, unspecified: Secondary | ICD-10-CM | POA: Diagnosis not present

## 2021-08-29 DIAGNOSIS — M25559 Pain in unspecified hip: Secondary | ICD-10-CM | POA: Diagnosis not present

## 2021-08-29 MED ORDER — MELOXICAM 15 MG PO TABS
15.0000 mg | ORAL_TABLET | Freq: Every day | ORAL | 0 refills | Status: DC
Start: 2021-08-29 — End: 2021-09-20

## 2021-08-29 MED ORDER — PREDNISONE 10 MG PO TABS: 10.0000 mg | ORAL_TABLET | Freq: Every day | ORAL | 0 refills | Status: DC

## 2021-08-29 NOTE — Progress Notes (Signed)
Date:  08/29/2021   Name:  Marie Nguyen   DOB:  Feb 08, 1956   MRN:  [id number]   Chief Complaint: Back Pain (Radiating to R) hip. Hx of scoliosis)  Back Pain This is a new problem. The current episode started in the past 7 days. The problem occurs constantly. The problem has been waxing and waning since onset. The pain is present in the lumbar spine. The quality of the pain is described as aching. Radiates to: right buttock. The pain is Worse during the night. The symptoms are aggravated by lying down and sitting. Associated symptoms include leg pain. Pertinent negatives include no abdominal pain, bladder incontinence, bowel incontinence, chest pain, dysuria, fever, headaches, numbness, paresthesias, tingling or weakness. She has tried NSAIDs for the symptoms. The treatment provided moderate relief.  Leg Pain  The incident occurred more than 1 week ago. The pain is present in the right hip. The quality of the pain is described as aching. The pain is moderate. The pain has been Constant since onset. Pertinent negatives include no inability to bear weight, loss of motion, loss of sensation, muscle weakness, numbness or tingling.   Lab Results  Component Value Date   NA 142 03/10/2021   K 3.9 03/10/2021   CO2 24 03/10/2021   GLUCOSE 96 03/10/2021   BUN 13 03/10/2021   CREATININE 0.76 03/10/2021   CALCIUM 9.9 03/10/2021   EGFR 89 03/10/2021   GFRNONAA 88 11/19/2018   Lab Results  Component Value Date   CHOL 210 (H) 03/10/2021   HDL 82 03/10/2021   LDLCALC 114 (H) 03/10/2021   TRIG 81 03/10/2021   Lab Results  Component Value Date   TSH 2.810 11/19/2018   No results found for: HGBA1C Lab Results  Component Value Date   WBC 4.6 03/10/2021   HGB 14.8 03/10/2021   HCT 42.9 03/10/2021   MCV 94 03/10/2021   PLT 247 03/10/2021   Lab Results  Component Value Date   ALT 16 03/10/2021   AST 18 03/10/2021   ALKPHOS 58 03/10/2021   BILITOT 1.3 (H) 03/10/2021   No results  found for: 25OHVITD2, 25OHVITD3, VD25OH   Review of Systems  Constitutional:  Negative for chills and fever.  HENT:  Negative for drooling, ear discharge, ear pain and sore throat.   Respiratory:  Negative for cough, shortness of breath and wheezing.   Cardiovascular:  Negative for chest pain, palpitations and leg swelling.  Gastrointestinal:  Negative for abdominal pain, blood in stool, bowel incontinence, constipation, diarrhea and nausea.  Endocrine: Negative for polydipsia.  Genitourinary:  Negative for bladder incontinence, dysuria, frequency, hematuria and urgency.  Musculoskeletal:  Positive for back pain. Negative for myalgias and neck pain.  Skin:  Negative for rash.  Allergic/Immunologic: Negative for environmental allergies.  Neurological:  Negative for dizziness, tingling, weakness, numbness, headaches and paresthesias.  Hematological:  Does not bruise/bleed easily.  Psychiatric/Behavioral:  Negative for suicidal ideas. The patient is not nervous/anxious.    Patient Active Problem List   Diagnosis Date Noted   Skin nodule 06/30/2013    Allergies  Allergen Reactions   Latex Rash    Past Surgical History:  Procedure Laterality Date   ABDOMINAL HYSTERECTOMY     AUGMENTATION MAMMAPLASTY     BREAST BIOPSY Right    neg   CESAREAN SECTION     COLONOSCOPY WITH PROPOFOL N/A 11/24/2018   Procedure: COLONOSCOPY WITH PROPOFOL;  Surgeon: Jonathon Bellows, MD;  Location: [facility] ENDOSCOPY;  Service: Gastroenterology;  Laterality: N/A;   COLONOSCOPY WITH PROPOFOL N/A 07/03/2021   Procedure: COLONOSCOPY WITH PROPOFOL;  Surgeon: Jonathon Bellows, MD;  Location: [facility] ENDOSCOPY;  Service: Gastroenterology;  Laterality: N/A;    Social History   Tobacco Use   Smoking status: Never   Smokeless tobacco: Never  Vaping Use   Vaping Use: Never used  Substance Use Topics   Alcohol use: Yes    Comment: occasional   Drug use: No     Medication list has been reviewed and updated.  Current  Meds  Medication Sig   hydrochlorothiazide (HYDRODIURIL) 12.5 MG tablet Take 1 tablet (12.5 mg total) by mouth daily.   omeprazole (PRILOSEC) 20 MG capsule Take 20 mg by mouth daily.   triamcinolone cream (KENALOG) 0.1 %    [DISCONTINUED] cetirizine (ZYRTEC) 10 MG tablet Take 10 mg by mouth daily.    PHQ 2/9 Scores 03/10/2021 01/17/2021  PHQ - 2 Score 0 0  PHQ- 9 Score 0 0    GAD 7 : Generalized Anxiety Score 03/10/2021 01/17/2021  Nervous, Anxious, on Edge 0 0  Control/stop worrying 0 1  Worry too much - different things 0 0  Trouble relaxing 0 0  Restless 0 0  Easily annoyed or irritable 0 0  Afraid - awful might happen 0 0  Total GAD 7 Score 0 1  Anxiety Difficulty - Not difficult at all    BP Readings from Last 3 Encounters:  08/29/21 134/90  07/03/21 (!) 156/95  05/25/21 (!) 165/95    Physical Exam Vitals and nursing note reviewed.  Constitutional:      Appearance: She is well-developed.  HENT:     Head: Normocephalic.     Right Ear: Tympanic membrane and external ear normal.     Left Ear: Tympanic membrane and external ear normal.  Eyes:     General: Lids are everted, no foreign bodies appreciated. No scleral icterus.       Left eye: No foreign body or hordeolum.     Conjunctiva/sclera: Conjunctivae normal.     Right eye: Right conjunctiva is not injected.     Left eye: Left conjunctiva is not injected.     Pupils: Pupils are equal, round, and reactive to light.  Neck:     Thyroid: No thyromegaly.     Vascular: No JVD.     Trachea: No tracheal deviation.  Cardiovascular:     Rate and Rhythm: Normal rate and regular rhythm.     Heart sounds: Normal heart sounds. No murmur heard.   No friction rub. No gallop.  Pulmonary:     Effort: Pulmonary effort is normal. No respiratory distress.     Breath sounds: Normal breath sounds. No wheezing or rales.  Abdominal:     General: Bowel sounds are normal.     Palpations: Abdomen is soft. There is no mass.      Tenderness: There is no abdominal tenderness. There is no guarding or rebound.  Musculoskeletal:        General: No tenderness. Normal range of motion.     Cervical back: Normal range of motion and neck supple.     Lumbar back: Deformity and spasms present. Negative right straight leg raise test and negative left straight leg raise test.  Lymphadenopathy:     Cervical: No cervical adenopathy.  Skin:    General: Skin is warm.     Findings: No rash.  Neurological:     Mental Status: She is alert and oriented to person,  place, and time.     Cranial Nerves: No cranial nerve deficit.     Deep Tendon Reflexes: Reflexes normal.  Psychiatric:        Mood and Affect: Mood is not anxious or depressed.    Wt Readings from Last 3 Encounters:  08/29/21 149 lb (67.6 kg)  07/03/21 140 lb (63.5 kg)  05/25/21 145 lb (65.8 kg)    BP 134/90   Pulse 64   Ht _0  (1.702 m)   Wt 149 lb (67.6 kg)   BMI 23.34 kg/m   Assessment and Plan:  1. Back pain, lumbosacral New onset.  Persistent.  Not associated with accident.  Patient has seen chiropractor in the past but no further evaluation.  Patient's exam has muscle spasm without neurologic concerns.  We will obtain a lumbar spine x-ray to evaluate for arthropathy and degenerative disc disease.  We will initiate treatment meloxicam 15 mg 1 a day and patient is not to use over-the-counter NSAIDs.  We will also begin prednisone 10 mg once a day. - DG Lumbar Spine Complete; Future - meloxicam (MOBIC) 15 MG tablet; Take 1 tablet (15 mg total) by mouth daily.  Dispense: 30 tablet; Refill: 0 - predniSONE (DELTASONE) 10 MG tablet; Take 1 tablet (10 mg total) by mouth daily with breakfast.  Dispense: 30 tablet; Refill: 0  2. Hip pain Patient's had hip pain which I think is also musculoskeletal.  There is no pain over the femoral neck.  Patient has full range of motion without pain I think this is muscular in nature and the meloxicam and prednisone should be  sufficient as well. - meloxicam (MOBIC) 15 MG tablet; Take 1 tablet (15 mg total) by mouth daily.  Dispense: 30 tablet; Refill: 0 - predniSONE (DELTASONE) 10 MG tablet; Take 1 tablet (10 mg total) by mouth daily with breakfast.  Dispense: 30 tablet; Refill: 0  3. Scoliosis, unspecified scoliosis type, unspecified spinal region Chronic.  Persistent.  Patient is only seen chiropractors in the past for it does not appear to be juvenile or adolescent in nature the patient was told that she had it couple years ago.  There has not been a formal evaluation for scoliosis and we are concentrating primarily in the lumbar area today but we will refer to orthopedics for evaluation and further therapy if necessary. - Ambulatory referral to Orthopedic Surgery

## 2021-09-20 ENCOUNTER — Other Ambulatory Visit: Payer: Self-pay

## 2021-09-20 ENCOUNTER — Ambulatory Visit: Payer: BC Managed Care – PPO | Admitting: Family Medicine

## 2021-09-20 ENCOUNTER — Encounter: Payer: Self-pay | Admitting: Family Medicine

## 2021-09-20 VITALS — BP 132/92 | HR 67 | Ht 67.0 in | Wt 152.0 lb

## 2021-09-20 DIAGNOSIS — M25551 Pain in right hip: Secondary | ICD-10-CM | POA: Diagnosis not present

## 2021-09-20 DIAGNOSIS — M47896 Other spondylosis, lumbar region: Secondary | ICD-10-CM

## 2021-09-20 DIAGNOSIS — M545 Low back pain, unspecified: Secondary | ICD-10-CM

## 2021-09-20 DIAGNOSIS — M25559 Pain in unspecified hip: Secondary | ICD-10-CM

## 2021-09-20 DIAGNOSIS — M47816 Spondylosis without myelopathy or radiculopathy, lumbar region: Secondary | ICD-10-CM

## 2021-09-20 MED ORDER — MELOXICAM 15 MG PO TABS
15.0000 mg | ORAL_TABLET | Freq: Every day | ORAL | 1 refills | Status: DC
Start: 2021-09-20 — End: 2022-03-13

## 2021-09-20 NOTE — Progress Notes (Signed)
Primary Care / Sports Medicine Office Visit  Patient Information:  Patient ID: Marie Nguyen, female DOB: 11/16/1955 Age: 65 y.o. MRN: [id number]   Marie Nguyen is a pleasant 65 y.o. 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  DG Lumbar Spine Complete  Result Date: 08/29/2021 CLINICAL DATA:  Back pain EXAM: LUMBAR SPINE - COMPLETE 4+ VIEW COMPARISON:  None. FINDINGS: Marked levoscoliosis with rotatory component. 10 mm left lateral listhesis L3 on L4. Trace retrolisthesis L2 on L3. Vertebral body heights are maintained. Multilevel degenerative changes with moderate disc space narrowing and degenerative change at L2-L3, L4-L5 and L5-S1. Facet degenerative changes at multiple levels. IMPRESSION: Marked scoliosis with multilevel degenerative changes. No acute osseous abnormality. Electronically Signed   By: Donavan Foil M.D.   On: 08/29/2021 21:44     Independent interpretation of notes and tests performed by another provider:   Independent interpretation of lumbar spine x-rays dated 08/29/2021 revealed significant levoscoliotic curvature, multilevel degenerative changes with primary involvement at  L5-S1, L4-L5, and L2-L3 where there is facet hypertrophy.  Retrolisthesis noted of L2 on L3, can be considered possible artifact secondary to degree of spine curvature.  No acute osseous processes identified.  Procedures performed:   None  Pertinent History, Exam, Impression, and Recommendations:   Lumbar spondylosis Patient with multilevel lumbar spondylosis in the setting of severe levoscoliotic curvature of the spine and a history of chronic lumbar pain with intermittent exacerbations.  She has noted a similar exacerbation over the past 1-2 months, atraumatic in onset though she maintains an active lifestyle (describing moving furniture around her house, walking regimen, etc.).  Her pain is localized to the right lower back with radiation to the front, additionally she has noted new "hip" pain describing anterior groin pain, intermittent radiation to the mid anterior right thigh.  Denies any overt weakness, no paresthesias, no bowel/bladder change.  Treatments to date have included prednisone and meloxicam course through her PCP Dr. Otilio Miu, this is allowed improvement without overt resolution.  Physical examination today does reveal limited hip flexion due to hamstring tightness on the right, tenderness primarily at the right lower lumbar paraspinal, secondarily to the right greater than left SI joints.  FABER negative, FADIR does localize pain to the groin the patient states that this is not the site of her previous experience discomfort.  Resisted straight leg raise does elicit anterior right hip pain.  Negative straight leg raise otherwise.  Her constellation of findings given her x-rays and chronicity of symptoms are most consistent with primary underlying lumbar spondylosis related symptoms in the setting of  significant levoscoliosis, associated deconditioning and focality to her right iliopsoas muscle/tendon and right hip joint.  I discussed the same with the patient as well as further  evaluation management strategies with a goal for long-term symptom control/resolution.  Given that symptoms are relatively controlled following Dr. Ronnald Ramp' regimen, I have advised continued meloxicam for 2 weeks then as needed, once weekly formal physical therapy, home exercises, and follow-up in 6 weeks.  If suboptimal progress at follow-up, can consider ultrasound-guided injections of the paraspinal musculature, SI joint, right hip joint.  For significant recalcitrant symptomatology, advanced imaging to be considered as well.  Right hip pain Acute issue the setting of chronic lumbosacral spondylosis and scoliosis related biomechanical issues.  Her findings show focality to the right hip joint versus right iliopsoas.  Entreaties entities next schedule was prescribed followed by as needed dosing, additionally formal physical therapy, and close follow-up in 6 weeks.  Pending focality of symptoms, can consider diagnostic/therapeutic right hip joint injections under ultrasound guidance.    Orders & Medications Meds ordered this encounter  Medications   meloxicam (MOBIC) 15 MG tablet    Sig: Take 1 tablet (15 mg total) by mouth daily.    Dispense:  30 tablet    Refill:  1   Orders Placed This Encounter  Procedures   Ambulatory referral to Physical Therapy     Return in about 6 weeks (around 11/01/2021).     Montel Culver, MD   Primary Care Sports Medicine [place]

## 2021-09-20 NOTE — Assessment & Plan Note (Addendum)
Patient with multilevel lumbar spondylosis in the setting of severe levoscoliotic curvature of the spine and a history of chronic lumbar pain with intermittent exacerbations.  She has noted a similar exacerbation over the past 1-2 months, atraumatic in onset though she maintains an active lifestyle (describing moving furniture around her house, walking regimen, etc.).  Her pain is localized to the right lower back with radiation to the front, additionally she has noted new "hip" pain describing anterior groin pain, intermittent radiation to the mid anterior right thigh.  Denies any overt weakness, no paresthesias, no bowel/bladder change.  Treatments to date have included prednisone and meloxicam course through her PCP Dr. Otilio Miu, this is allowed improvement without overt resolution.  Physical examination today does reveal limited hip flexion due to hamstring tightness on the right, tenderness primarily at the right lower lumbar paraspinal, secondarily to the right greater than left SI joints.  FABER negative, FADIR does localize pain to the groin the patient states that this is not the site of her previous experience discomfort.  Resisted straight leg raise does elicit anterior right hip pain.  Negative straight leg raise otherwise.  Her constellation of findings given her x-rays and chronicity of symptoms are most consistent with primary underlying lumbar spondylosis related symptoms in the setting of significant levoscoliosis, associated deconditioning and focality to her right iliopsoas muscle/tendon and right hip joint.  I discussed the same with the patient as well as further evaluation management strategies with a goal for long-term symptom control/resolution.  Given that symptoms are relatively controlled following Dr. Ronnald Ramp' regimen, I have advised continued meloxicam for 2 weeks then as needed, once weekly formal physical therapy, home exercises, and follow-up in 6 weeks.  If suboptimal  progress at follow-up, can consider ultrasound-guided injections of the paraspinal musculature, SI joint, right hip joint.  For significant recalcitrant symptomatology, advanced imaging to be considered as well.

## 2021-09-20 NOTE — Assessment & Plan Note (Signed)
Acute issue the setting of chronic lumbosacral spondylosis and scoliosis related biomechanical issues.  Her findings show focality to the right hip joint versus right iliopsoas.  Entreaties entities next schedule was prescribed followed by as needed dosing, additionally formal physical therapy, and close follow-up in 6 weeks.  Pending focality of symptoms, can consider diagnostic/therapeutic right hip joint injections under ultrasound guidance.

## 2021-10-27 ENCOUNTER — Other Ambulatory Visit: Payer: Self-pay

## 2021-10-27 ENCOUNTER — Encounter: Payer: Self-pay | Admitting: Family Medicine

## 2021-10-27 ENCOUNTER — Ambulatory Visit: Payer: BC Managed Care – PPO | Admitting: Family Medicine

## 2021-10-27 VITALS — BP 150/100 | HR 64 | Ht 67.0 in | Wt 150.0 lb

## 2021-10-27 DIAGNOSIS — E7801 Familial hypercholesterolemia: Secondary | ICD-10-CM | POA: Diagnosis not present

## 2021-10-27 DIAGNOSIS — I10 Essential (primary) hypertension: Secondary | ICD-10-CM

## 2021-10-27 MED ORDER — LISINOPRIL-HYDROCHLOROTHIAZIDE 10-12.5 MG PO TABS: 1.0000 | ORAL_TABLET | Freq: Every day | ORAL | 3 refills | Status: DC

## 2021-10-27 NOTE — Patient Instructions (Signed)

## 2021-10-27 NOTE — Progress Notes (Signed)
Date:  10/27/2021   Name:  Marie Nguyen   DOB:  Dec 13, 1955   MRN:  [id number]   Chief Complaint: Hypertension  Hypertension This is a chronic problem. The current episode started more than 1 year ago. The problem has been waxing and waning since onset. The problem is uncontrolled. Pertinent negatives include no anxiety, blurred vision, chest pain, headaches, malaise/fatigue, neck pain, orthopnea, palpitations, peripheral edema, PND, shortness of breath or sweats. Risk factors for coronary artery disease include dyslipidemia. Past treatments include diuretics and ACE inhibitors. The current treatment provides moderate improvement. There are no compliance problems.  There is no history of angina, kidney disease, CAD/MI, CVA, heart failure, left ventricular hypertrophy, PVD or retinopathy. There is no history of chronic renal disease, a hypertension causing med or renovascular disease.   Lab Results  Component Value Date   NA 142 03/10/2021   K 3.9 03/10/2021   CO2 24 03/10/2021   GLUCOSE 96 03/10/2021   BUN 13 03/10/2021   CREATININE 0.76 03/10/2021   CALCIUM 9.9 03/10/2021   EGFR 89 03/10/2021   GFRNONAA 88 11/19/2018   Lab Results  Component Value Date   CHOL 210 (H) 03/10/2021   HDL 82 03/10/2021   LDLCALC 114 (H) 03/10/2021   TRIG 81 03/10/2021   Lab Results  Component Value Date   TSH 2.810 11/19/2018   No results found for: HGBA1C Lab Results  Component Value Date   WBC 4.6 03/10/2021   HGB 14.8 03/10/2021   HCT 42.9 03/10/2021   MCV 94 03/10/2021   PLT 247 03/10/2021   Lab Results  Component Value Date   ALT 16 03/10/2021   AST 18 03/10/2021   ALKPHOS 58 03/10/2021   BILITOT 1.3 (H) 03/10/2021   No results found for: 25OHVITD2, 25OHVITD3, VD25OH   Review of Systems  Constitutional: Negative.  Negative for chills, fatigue, fever, malaise/fatigue and unexpected weight change.  HENT:  Negative for congestion, ear discharge, ear pain, rhinorrhea, sinus  pressure, sneezing and sore throat.   Eyes:  Negative for blurred vision, photophobia, pain, discharge, redness and itching.  Respiratory:  Negative for cough, shortness of breath, wheezing and stridor.   Cardiovascular:  Negative for chest pain, palpitations, orthopnea and PND.  Gastrointestinal:  Negative for abdominal pain, blood in stool, constipation, diarrhea, nausea and vomiting.  Endocrine: Negative for cold intolerance, heat intolerance, polydipsia, polyphagia and polyuria.  Genitourinary:  Negative for dysuria, flank pain, frequency, hematuria, menstrual problem, pelvic pain, urgency, vaginal bleeding and vaginal discharge.  Musculoskeletal:  Negative for arthralgias, back pain, myalgias and neck pain.  Skin:  Negative for rash.  Allergic/Immunologic: Negative for environmental allergies and food allergies.  Neurological:  Negative for dizziness, weakness, light-headedness, numbness and headaches.  Hematological:  Negative for adenopathy. Does not bruise/bleed easily.  Psychiatric/Behavioral:  Negative for dysphoric mood. The patient is not nervous/anxious.    Patient Active Problem List   Diagnosis Date Noted   Lumbar spondylosis 09/20/2021   Right hip pain 09/20/2021   Skin nodule 06/30/2013    Allergies  Allergen Reactions   Latex Rash    Past Surgical History:  Procedure Laterality Date   ABDOMINAL HYSTERECTOMY     AUGMENTATION MAMMAPLASTY     BREAST BIOPSY Right    neg   CESAREAN SECTION     COLONOSCOPY WITH PROPOFOL N/A 11/24/2018   Procedure: COLONOSCOPY WITH PROPOFOL;  Surgeon: Wyline Mood, MD;  Location: [facility] ENDOSCOPY;  Service: Gastroenterology;  Laterality: N/A;   COLONOSCOPY  WITH PROPOFOL N/A 07/03/2021   Procedure: COLONOSCOPY WITH PROPOFOL;  Surgeon: Wyline Mood, MD;  Location: [facility] ENDOSCOPY;  Service: Gastroenterology;  Laterality: N/A;    Social History   Tobacco Use   Smoking status: Never   Smokeless tobacco: Never  Vaping Use   Vaping Use:  Never used  Substance Use Topics   Alcohol use: Yes   Drug use: Never     Medication list has been reviewed and updated.  Current Meds  Medication Sig   fluticasone (FLONASE) 50 MCG/ACT nasal spray Place 1 spray into both nostrils daily as needed for allergies or rhinitis.   hydrochlorothiazide (HYDRODIURIL) 12.5 MG tablet Take 1 tablet (12.5 mg total) by mouth daily.   omeprazole (PRILOSEC) 20 MG capsule Take 20 mg by mouth daily.   triamcinolone cream (KENALOG) 0.1 %     PHQ 2/9 Scores 10/27/2021 09/20/2021 03/10/2021 01/17/2021  PHQ - 2 Score 0 0 0 0  PHQ- 9 Score 0 0 0 0    GAD 7 : Generalized Anxiety Score 10/27/2021 09/20/2021 03/10/2021 01/17/2021  Nervous, Anxious, on Edge 0 0 0 0  Control/stop worrying 0 0 0 1  Worry too much - different things 0 0 0 0  Trouble relaxing 0 0 0 0  Restless 0 0 0 0  Easily annoyed or irritable 0 0 0 0  Afraid - awful might happen 0 0 0 0  Total GAD 7 Score 0 0 0 1  Anxiety Difficulty Not difficult at all Not difficult at all - Not difficult at all    BP Readings from Last 3 Encounters:  10/27/21 (!) 150/100  09/20/21 (!) 132/92  08/29/21 134/90    Physical Exam Vitals and nursing note reviewed.  Constitutional:      Appearance: She is well-developed.  HENT:     Head: Normocephalic.     Right Ear: Tympanic membrane, ear canal and external ear normal. There is no impacted cerumen.     Left Ear: Tympanic membrane, ear canal and external ear normal. There is no impacted cerumen.     Nose: Nose normal.  Eyes:     General: Lids are everted, no foreign bodies appreciated. No scleral icterus.       Left eye: No foreign body or hordeolum.     Conjunctiva/sclera: Conjunctivae normal.     Right eye: Right conjunctiva is not injected.     Left eye: Left conjunctiva is not injected.     Pupils: Pupils are equal, round, and reactive to light.  Neck:     Thyroid: No thyromegaly.     Vascular: No JVD.     Trachea: No tracheal deviation.   Cardiovascular:     Rate and Rhythm: Normal rate and regular rhythm.     Heart sounds: Normal heart sounds. No murmur heard.   No friction rub. No gallop.  Pulmonary:     Effort: Pulmonary effort is normal. No respiratory distress.     Breath sounds: Normal breath sounds. No wheezing, rhonchi or rales.  Abdominal:     General: Bowel sounds are normal.     Palpations: Abdomen is soft. There is no mass.     Tenderness: There is no abdominal tenderness. There is no guarding or rebound.  Musculoskeletal:        General: No tenderness. Normal range of motion.     Cervical back: Normal range of motion and neck supple.  Lymphadenopathy:     Cervical: No cervical adenopathy.  Skin:  General: Skin is warm.     Findings: No rash.  Neurological:     Mental Status: She is alert and oriented to person, place, and time.     Cranial Nerves: No cranial nerve deficit.     Deep Tendon Reflexes: Reflexes normal.  Psychiatric:        Mood and Affect: Mood is not anxious or depressed.    Wt Readings from Last 3 Encounters:  10/27/21 150 lb (68 kg)  09/20/21 152 lb (68.9 kg)  08/29/21 149 lb (67.6 kg)    BP (!) 150/100    Pulse 64    Ht $R'5\' 7"'Cm$  (1.702 m)    Wt 150 lb (68 kg)    BMI 23.49 kg/m   Assessment and Plan:  1. Primary hypertension Chronic.  Uncontrolled.  Relatively stable.  Patient ran out of her lisinopril and has only currently taking hydrochlorothiazide.  Discussed with patient that we will not need to resume blood pressure since her current blood pressure today is 150/100.  We will resume with combination of lisinopril hydrochlorothiazide 10-12 0.5 once a day patient will return in 8 to 12 weeks for repeat blood pressure on medication and has been encouraged to be fasting so we can obtain labs. - lisinopril-hydrochlorothiazide (ZESTORETIC) 10-12.5 MG tablet; Take 1 tablet by mouth daily.  Dispense: 90 tablet; Refill: 3  2. Familial hypercholesterolemia Chronic.  Controlled.   Stable.  We will treat with low-cholesterol low triglyceride diet guidelines and will recheck patient fasting in 8 to 12 weeks.

## 2021-11-01 ENCOUNTER — Ambulatory Visit: Payer: BC Managed Care – PPO | Admitting: Family Medicine

## 2021-12-22 ENCOUNTER — Ambulatory Visit (INDEPENDENT_AMBULATORY_CARE_PROVIDER_SITE_OTHER): Payer: BC Managed Care – PPO | Admitting: Family Medicine

## 2021-12-22 ENCOUNTER — Encounter: Payer: Self-pay | Admitting: Family Medicine

## 2021-12-22 ENCOUNTER — Ambulatory Visit: Payer: BC Managed Care – PPO | Admitting: Family Medicine

## 2021-12-22 VITALS — BP 120/64 | HR 80 | Ht 67.0 in | Wt 146.0 lb

## 2021-12-22 DIAGNOSIS — Z23 Encounter for immunization: Secondary | ICD-10-CM

## 2021-12-22 DIAGNOSIS — I10 Essential (primary) hypertension: Secondary | ICD-10-CM

## 2021-12-22 DIAGNOSIS — Z90710 Acquired absence of both cervix and uterus: Secondary | ICD-10-CM | POA: Diagnosis not present

## 2021-12-22 DIAGNOSIS — E7801 Familial hypercholesterolemia: Secondary | ICD-10-CM | POA: Diagnosis not present

## 2021-12-22 NOTE — Progress Notes (Signed)
? ? ?Date:  12/22/2021  ? ?Name:  Marie Nguyen   DOB:  09-28-1955   MRN:  [id number] ? ? ?Chief Complaint: Hypertension ? ?Hypertension ?This is a chronic problem. The current episode started more than 1 year ago. The problem has been gradually improving since onset. The problem is controlled. Pertinent negatives include no blurred vision, chest pain, headaches, orthopnea, palpitations, peripheral edema, PND or shortness of breath. There are no associated agents to hypertension. There are no known risk factors for coronary artery disease. Past treatments include ACE inhibitors and diuretics. The current treatment provides moderate improvement. There are no compliance problems.  There is no history of angina, kidney disease, CAD/MI, CVA, heart failure, left ventricular hypertrophy, PVD or retinopathy. There is no history of chronic renal disease, a hypertension causing med or renovascular disease.  ? ?Lab Results  ?Component Value Date  ? NA 142 03/10/2021  ? K 3.9 03/10/2021  ? CO2 24 03/10/2021  ? GLUCOSE 96 03/10/2021  ? BUN 13 03/10/2021  ? CREATININE 0.76 03/10/2021  ? CALCIUM 9.9 03/10/2021  ? EGFR 89 03/10/2021  ? GFRNONAA 88 11/19/2018  ? ?Lab Results  ?Component Value Date  ? CHOL 210 (H) 03/10/2021  ? HDL 82 03/10/2021  ? LDLCALC 114 (H) 03/10/2021  ? TRIG 81 03/10/2021  ? ?Lab Results  ?Component Value Date  ? TSH 2.810 11/19/2018  ? ?No results found for: HGBA1C ?Lab Results  ?Component Value Date  ? WBC 4.6 03/10/2021  ? HGB 14.8 03/10/2021  ? HCT 42.9 03/10/2021  ? MCV 94 03/10/2021  ? PLT 247 03/10/2021  ? ?Lab Results  ?Component Value Date  ? ALT 16 03/10/2021  ? AST 18 03/10/2021  ? ALKPHOS 58 03/10/2021  ? BILITOT 1.3 (H) 03/10/2021  ? ?No results found for: 25OHVITD2, [place], VD25OH  ? ?Review of Systems  ?Constitutional:  Negative for fatigue, fever and unexpected weight change.  ?Eyes:  Negative for blurred vision.  ?Respiratory:  Negative for shortness of breath and wheezing.    ?Cardiovascular:  Negative for chest pain, palpitations, orthopnea, leg swelling and PND.  ?Gastrointestinal:  Negative for abdominal pain.  ?Endocrine: Negative for polydipsia and polyuria.  ?Neurological:  Negative for headaches.  ? ?Patient Active Problem List  ? Diagnosis Date Noted  ? H/O total hysterectomy 12/22/2021  ? Lumbar spondylosis 09/20/2021  ? Right hip pain 09/20/2021  ? Skin nodule 06/30/2013  ? ? ?Allergies  ?Allergen Reactions  ? Latex Rash  ? ? ?Past Surgical History:  ?Procedure Laterality Date  ? ABDOMINAL HYSTERECTOMY    ? AUGMENTATION MAMMAPLASTY    ? BREAST BIOPSY Right   ? neg  ? CESAREAN SECTION    ? COLONOSCOPY WITH PROPOFOL N/A 11/24/2018  ? Procedure: COLONOSCOPY WITH PROPOFOL;  Surgeon: Jonathon Bellows, MD;  Location: [facility] ENDOSCOPY;  Service: Gastroenterology;  Laterality: N/A;  ? COLONOSCOPY WITH PROPOFOL N/A 07/03/2021  ? Procedure: COLONOSCOPY WITH PROPOFOL;  Surgeon: Jonathon Bellows, MD;  Location: [facility] ENDOSCOPY;  Service: Gastroenterology;  Laterality: N/A;  ? TOTAL ABDOMINAL HYSTERECTOMY    ? ? ?Social History  ? ?Tobacco Use  ? Smoking status: Never  ? Smokeless tobacco: Never  ?Vaping Use  ? Vaping Use: Never used  ?Substance Use Topics  ? Alcohol use: Yes  ? Drug use: Never  ? ? ? ?Medication list has been reviewed and updated. ? ?Current Meds  ?Medication Sig  ? azelastine (ASTELIN) 0.1 % nasal spray Place 1 spray into both nostrils daily.  Use in each nostril as directed  ? fluticasone (FLONASE) 50 MCG/ACT nasal spray Place 1 spray into both nostrils daily as needed for allergies or rhinitis.  ? lisinopril-hydrochlorothiazide (ZESTORETIC) 10-12.5 MG tablet Take 1 tablet by mouth daily.  ? omeprazole (PRILOSEC) 20 MG capsule Take 20 mg by mouth daily.  ? triamcinolone cream (KENALOG) 0.1 %   ? ? ? ?  12/22/2021  ?  8:15 AM 10/27/2021  ?  2:04 PM 09/20/2021  ?  9:57 AM 03/10/2021  ?  8:59 AM  ?GAD 7 : Generalized Anxiety Score  ?Nervous, Anxious, on Edge 0 0 0 0  ?Control/stop worrying  0 0 0 0  ?Worry too much - different things 0 0 0 0  ?Trouble relaxing 0 0 0 0  ?Restless 0 0 0 0  ?Easily annoyed or irritable 0 0 0 0  ?Afraid - awful might happen 0 0 0 0  ?Total GAD 7 Score 0 0 0 0  ?Anxiety Difficulty Not difficult at all Not difficult at all Not difficult at all   ? ? ? ?  12/22/2021  ?  8:14 AM  ?Depression screen PHQ 2/9  ?Decreased Interest 0  ?Down, Depressed, Hopeless 0  ?PHQ - 2 Score 0  ?Altered sleeping 0  ?Tired, decreased energy 0  ?Change in appetite 0  ?Feeling bad or failure about yourself  0  ?Trouble concentrating 0  ?Moving slowly or fidgety/restless 0  ?Suicidal thoughts 0  ?PHQ-9 Score 0  ?Difficult doing work/chores Not difficult at all  ? ? ?BP Readings from Last 3 Encounters:  ?12/22/21 120/64  ?10/27/21 (!) 150/100  ?09/20/21 (!) 132/92  ? ? ?Physical Exam ?Vitals and nursing note reviewed. Exam conducted with a chaperone present.  ?Constitutional:   ?   General: She is not in acute distress. ?   Appearance: She is not diaphoretic.  ?HENT:  ?   Head: Normocephalic and atraumatic.  ?   Right Ear: Tympanic membrane and external ear normal.  ?   Left Ear: Tympanic membrane and external ear normal.  ?   Nose: Nose normal.  ?Eyes:  ?   General:     ?   Right eye: No discharge.     ?   Left eye: No discharge.  ?   Conjunctiva/sclera: Conjunctivae normal.  ?   Pupils: Pupils are equal, round, and reactive to light.  ?Neck:  ?   Thyroid: No thyromegaly.  ?   Vascular: No JVD.  ?Cardiovascular:  ?   Rate and Rhythm: Normal rate and regular rhythm.  ?   Heart sounds: Normal heart sounds, S1 normal and S2 normal. No murmur heard. ?No systolic murmur is present.  ?No diastolic murmur is present.  ?  No friction rub. No gallop. No S3 or S4 sounds.  ?Pulmonary:  ?   Effort: Pulmonary effort is normal.  ?   Breath sounds: Normal breath sounds. No decreased breath sounds, wheezing, rhonchi or rales.  ?Abdominal:  ?   General: Bowel sounds are normal.  ?   Palpations: Abdomen is soft.  There is no hepatomegaly, splenomegaly or mass.  ?   Tenderness: There is no abdominal tenderness. There is no guarding.  ?Musculoskeletal:     ?   General: Normal range of motion.  ?   Cervical back: Normal range of motion and neck supple.  ?Lymphadenopathy:  ?   Cervical: No cervical adenopathy.  ?Skin: ?   General: Skin is warm and dry.  ?  Neurological:  ?   Mental Status: She is alert.  ?   Deep Tendon Reflexes: Reflexes are normal and symmetric.  ? ? ?Wt Readings from Last 3 Encounters:  ?12/22/21 146 lb (66.2 kg)  ?10/27/21 150 lb (68 kg)  ?09/20/21 152 lb (68.9 kg)  ? ? ?BP 120/64   Pulse 80   Ht $R'5\' 7"'AJ$  (1.702 m)   Wt 146 lb (66.2 kg)   BMI 22.87 kg/m?  ? ?Assessment and Plan: ? ?1. Primary hypertension ?Chronic.  Controlled.  Stable.  Blood pressure today is 120/64.  Will check renal function panel for electrolytes and GFR.  We will continue current dosing of lisinopril hydrochlorothiazide 10-12 0.5 once a day. ?- Renal Function Panel ? ?2. Familial hypercholesterolemia ?Chronic.  Controlled.  Stable.  Elevation of LDL in the past controlled with diet.  Patient has been provided with lipid clinic guidelines and we will check lipid panel at this time. ?- Lipid Panel With LDL/HDL Ratio ? ?3. H/O total hysterectomy ?Patient is status post hysterectomy not requiring to have surveillance for cervical cancer at this time. ? ?4. Need for Tdap vaccination ?Discussed and administered ?- Tdap vaccine greater than or equal to 7yo IM  ? ? ?

## 2021-12-22 NOTE — Patient Instructions (Signed)
GUIDELINES FOR  ?LOW-CHOLESTEROL, LOW-TRIGLYCERIDE DIETS  ?  ?FOODS TO USE  ? ?MEATS, FISH Choose lean meats (chicken, [place], veal, and non-fatty cuts of beef with excess fat trimmed; one serving = 3 oz of cooked meat). Also, fresh or frozen fish, canned fish packed in water, and shellfish (lobster, crabs, shrimp, and oysters). Limit use to no more than one serving of one of these per week. Shellfish are high in cholesterol but low in saturated fat and should be used sparingly. Meats and fish should be broiled (pan or oven) or baked on a rack.  ?EGGS Egg substitutes and egg whites (use freely). Egg yolks (limit two per week).  ?FRUITS Eat three servings of fresh fruit per day (1 serving = ? cup). Be sure to have at least one citrus fruit daily. Frozen and canned fruit with no sugar or syrup added may be used.  ?VEGETABLES Most vegetables are not limited (see next page). One dark-green (string beans, escarole) or one deep yellow (squash) vegetable is recommended daily. Cauliflower, broccoli, and celery, as well as potato skins, are recommended for their fiber content. (Fiber is associated with cholesterol reduction) It is preferable to steam vegetables, but they may be boiled, strained, or braised with polyunsaturated vegetable oil (see below).  ?BEANS Dried peas or beans (1 serving = ? cup) may be used as a bread substitute.  ?NUTS Almonds, walnuts, and peanuts may be used sparingly  ?(1 serving = 1 Tablespoonful). Use pumpkin, sesame, or sunflower seeds.  ?BREADS, GRAINS One roll or one slice of whole grain or enriched bread may be used, or three soda crackers or four pieces of melba toast as a substitute. Spaghetti, rice or noodles (? cup) or ? large ear of corn may be used as a bread substitute. In preparing these foods do not use butter or shortening, use soft margarine. Also use egg and sugar substitutes.  Choose high fiber grains, such as oats and whole wheat.  ?CEREALS Use ? cup of hot cereal or ? cup of  cold cereal per day. Add a sugar substitute if desired, with 99% fat free or skim milk.  ?MILK PRODUCTS Always use 99% fat free or skim milk, dairy products such as low fat cheeses (farmer's uncreamed diet cottage), low-fat yogurt, and powdered skim milk.  ?FATS, OILS Use soft (not stick) margarine; vegetable oils that are high in polyunsaturated fats (such as safflower, sunflower, soybean, corn, and cottonseed). Always refrigerate meat drippings to harden the fat and remove it before preparing gravies  ?DESSERTS, SNACKS Limit to two servings per day; substitute each serving for a bread/cereal serving: ice milk, water sherbet (1/4 cup); unflavored gelatin or gelatin flavored with sugar substitute (1/3 cup); pudding prepared with skim milk (1/2 cup); egg white souffl?s; unbuttered popcorn (1 ? cups). Substitute carob for chocolate.  ?BEVERAGES Fresh fruit juices (limit 4 oz per day); black coffee, plain or herbal teas; soft drinks with sugar substitutes; club soda, preferably salt-free; cocoa made with skim milk or nonfat dried milk and water (sugar substitute added if desired); clear broth. Alcohol: limit two servings per day (see second page).  ?MISCELLANEOUS ? You may use the following freely: vinegar, spices, herbs, nonfat bouillon, mustard, Worcestershire sauce, soy sauce, flavoring essence.  ? ? ? ? ? ? ? ? ? ? ? ? ? ? ? ? ?GUIDELINES FOR  ?LOW-CHOLESTEROL, LOW TRIGLYCERIDE DIETS  ?  ?FOODS TO AVOID  ? ?MEATS, FISH Marbled beef, pork, bacon, sausage, and other pork products; fatty  fowl (duck, goose); skin and fat of [place] and chicken; processed meats; luncheon meats (salami, bologna); frankfurters and fast-food hamburgers (theyre loaded with fat); organ meats (kidneys, liver); canned fish packed in oil.  ?EGGS Limit egg yolks to two per week.   ?FRUITS Coconuts (rich in saturated fats).  ?VEGETABLES Avoid avocados. Starchy vegetables (potatoes, corn, lima beans, dried peas, beans) may be used only if  substitutes for a serving of bread or cereal. (Baked potato skin, however, is desirable for its fiber content.  ?BEANS Commercial baked beans with sugar and/or pork added.  ?NUTS Avoid nuts.  Limit peanuts and walnuts to one tablespoonful per day.  ?BREADS, GRAINS Any baked goods with shortening and/or sugar. Commercial mixes with dried eggs and whole milk. Avoid sweet rolls, doughnuts, breakfast pastries (Danish), and sweetened packaged cereals (the added sugar converts readily to triglycerides).  ?MILK PRODUCTS Whole milk and whole-milk packaged goods; cream; ice cream; whole-milk puddings, yogurt, or cheeses; nondairy cream substitutes.  ?FATS, OILS Butter, lard, animal fats, bacon drippings, gravies, cream sauces as well as palm and coconut oils. All these are high in saturated fats. Examine labels on cholesterol free products for hydrogenated fats. (These are oils that have been hardened into solids and in the process have become saturated.)  ?DESSERTS, SNACKS Fried snack foods like potato chips; chocolate; candies in general; jams, jellies, syrups; whole- milk puddings; ice cream and milk sherbets; hydrogenated peanut butter.  ?BEVERAGES Sugared fruit juices and soft drinks; cocoa made with whole milk and/or sugar. When using alcohol (1 oz liquor, 5 oz beer, or 2 ? oz dry table wine per serving), one serving must be substituted for one bread or cereal serving (limit, two servings of alcohol per day).  ? SPECIAL NOTES  ?  Remember that even non-limited foods should be used in moderation. ?While on a cholesterol-lowering diet, be sure to avoid animal fats and marbled meats. ?3. While on a triglyceride-lowering diet, be sure to avoid sweets and to control the amount of carbohydrates you eat (starchy foods such as flour, bread, potatoes).While on a tri-glyceride-lowering diet, be sure to avoid sweets ?Buy a good low-fat cookbook, such as the one published by the American Heart Association. ?Consult your physician  if you have any questions.  ? ? ? ? ? ? ? ? ? ? ? ? ? ?[facility] Low Glycemic Diet Plan ? ? ?Low Glycemic Foods (20-49) Moderate Glycemic Foods (50-69) High Glycemic Foods (70-100)  ?    ?Breakfast Creals Breakfast Cereals Breakfast Cereals  ?All Bran All-Bran Fruit'n Oats  ? Bran Buds Bran Chex  ? Cheerios Corn chex  ?  ?Fiber One Oatmeal (not instant)  ? Just Right Mini-Wheats  ? Corn Flakes Cream of Wheat  ?  ?Oat Bran Special K Swiss Muesli  ? Grape Nuts Grape Nut Flakes  ?  ?  Grits Nutri-Grain  ?  ?Fruits and fruit juice: Fruits Puffed Rice Puffed Wheat  ?  ?(Limit to 1-2 Servings per day) Banana (under-ride) Dates  ? Rice Chex Rice Krispies  ?  ?Apples Apricots (fresh/dried)  ? Figs Grapes  ? Shredded Wheat Team  ?  ?Blackberries Blueberries  ? Kiwi Mango  ? Total   ?  ?Cherries Cranberries  ? Oranges Raisins  ?   ?Peaches Pears  ?  Fruits  ?Plums Prunes  ? Fruit Juices Pineapple Watermelon  ?  ?Grapefruit Raspberries  ? Cranberry Juice Orange Juice  ? Banana (over-ripe)   ?  ?Strawberries Tangerines  ?    ?  Apple Juice Grapefruit Juice  ? Beans and Legumes Beverages  ?Tomato Juice   ? [place]-type baked beans Sodas, sweet tea, pineapple juice  ? Canned pinto, kidney, or navy beans   ?Beans and Legumes (fresh-cooked) Green peas Vegetables  ?Black-eyed peas Butter Beans  ?  Potato, baked, boiled, fried, mashed  ?Chick peas Lentils  ? Vegetables French fries  ?Green beans Lima beans  ? Beets Carrots  ? Canned or frozen corn  ?Kidney beans Navy beans  ? Sweet potato Yam  ? Parsnips  ?Pinto beans Snow peas  ? Corn on the cob Winter squash  ?    ?Non-starchy vegetables Grains Breads  ?Asparagus, avocado, broccoli, cabbage Cornmeal Rice, brown  ? Most breads (white and whole grain)  ?cauliflower, celery, cucumber, greens Rice, white Couscous  ? Bagels Bread sticks  ?  ?lettuce, mushrooms, peppers, tomatoes  Bread stuffing Kaiser roll  ?  ?okra, onions, spinach, summer squash Pasta Dinner rolls  ? [place], cheese  ?   ?Grains Ravioli, meat filled Spaghetti, white  ? Grains  ?Barley Bulgur  ?  Rice, instant Tapioca, with milk  ?  ?Rye Wild rice  ? Nuts   ? Cashews Macadamia  ? Candy and most cookies  ?Nuts and o

## 2021-12-23 LAB — RENAL FUNCTION PANEL
Albumin: 4.7 g/dL (ref 3.8–4.8)
BUN/Creatinine Ratio: 22 (ref 12–28)
BUN: 15 mg/dL (ref 8–27)
CO2: 23 mmol/L (ref 20–29)
Calcium: 10.1 mg/dL (ref 8.7–10.3)
Chloride: 102 mmol/L (ref 96–106)
Creatinine, Ser: 0.69 mg/dL (ref 0.57–1.00)
Glucose: 98 mg/dL (ref 70–99)
Phosphorus: 4.1 mg/dL (ref 3.0–4.3)
Potassium: 4.2 mmol/L (ref 3.5–5.2)
Sodium: 144 mmol/L (ref 134–144)
eGFR: 97 mL/min/{1.73_m2} (ref >59)

## 2021-12-23 LAB — LIPID PANEL WITH LDL/HDL RATIO
Cholesterol, Total: 201 mg/dL — ABNORMAL HIGH (ref 100–199)
HDL: 85 mg/dL (ref >39)
LDL Chol Calc (NIH): 99 mg/dL (ref 0–99)
LDL/HDL Ratio: 1.2 ratio (ref 0.0–3.2)
Triglycerides: 97 mg/dL (ref 0–149)
VLDL Cholesterol Cal: 17 mg/dL (ref 5–40)

## 2022-03-13 ENCOUNTER — Other Ambulatory Visit: Payer: Self-pay

## 2022-03-13 ENCOUNTER — Encounter: Payer: Self-pay | Admitting: Family Medicine

## 2022-03-13 ENCOUNTER — Ambulatory Visit (INDEPENDENT_AMBULATORY_CARE_PROVIDER_SITE_OTHER): Payer: BC Managed Care – PPO | Admitting: Family Medicine

## 2022-03-13 VITALS — BP 122/88 | HR 72 | Ht 67.0 in | Wt 148.0 lb

## 2022-03-13 DIAGNOSIS — Z8719 Personal history of other diseases of the digestive system: Secondary | ICD-10-CM

## 2022-03-13 DIAGNOSIS — Z90710 Acquired absence of both cervix and uterus: Secondary | ICD-10-CM | POA: Diagnosis not present

## 2022-03-13 DIAGNOSIS — N6459 Other signs and symptoms in breast: Secondary | ICD-10-CM

## 2022-03-13 DIAGNOSIS — Z23 Encounter for immunization: Secondary | ICD-10-CM | POA: Diagnosis not present

## 2022-03-13 DIAGNOSIS — Z1231 Encounter for screening mammogram for malignant neoplasm of breast: Secondary | ICD-10-CM

## 2022-03-13 DIAGNOSIS — Z0000 Encounter for general adult medical examination without abnormal findings: Secondary | ICD-10-CM

## 2022-03-13 DIAGNOSIS — E7801 Familial hypercholesterolemia: Secondary | ICD-10-CM

## 2022-03-13 DIAGNOSIS — I10 Essential (primary) hypertension: Secondary | ICD-10-CM

## 2022-03-13 LAB — HEMOCCULT GUIAC POC 1CARD (OFFICE): Fecal Occult Blood, POC: NEGATIVE

## 2022-03-13 NOTE — Progress Notes (Signed)
Date:  03/13/2022   Name:  Marie Nguyen   DOB:  Sep 27, 1955   MRN:  [id number]   Chief Complaint: Annual Exam  Patient is a 66 year old female who presents for a comprehensive physical exam. The patient reports the following problems: none. Health maintenance has been reviewed needs mammogram.      Lab Results  Component Value Date   NA 144 12/22/2021   K 4.2 12/22/2021   CO2 23 12/22/2021   GLUCOSE 98 12/22/2021   BUN 15 12/22/2021   CREATININE 0.69 12/22/2021   CALCIUM 10.1 12/22/2021   EGFR 97 12/22/2021   GFRNONAA 88 11/19/2018   Lab Results  Component Value Date   CHOL 201 (H) 12/22/2021   HDL 85 12/22/2021   LDLCALC 99 12/22/2021   TRIG 97 12/22/2021   Lab Results  Component Value Date   TSH 2.810 11/19/2018   No results found for: "HGBA1C" Lab Results  Component Value Date   WBC 4.6 03/10/2021   HGB 14.8 03/10/2021   HCT 42.9 03/10/2021   MCV 94 03/10/2021   PLT 247 03/10/2021   Lab Results  Component Value Date   ALT 16 03/10/2021   AST 18 03/10/2021   ALKPHOS 58 03/10/2021   BILITOT 1.3 (H) 03/10/2021   No results found for: "25OHVITD2", "25OHVITD3", "VD25OH"   Review of Systems  Constitutional:  Negative for chills and fever.  HENT:  Negative for drooling, ear discharge, ear pain and sore throat.   Respiratory:  Negative for cough, shortness of breath and wheezing.   Cardiovascular:  Negative for chest pain, palpitations and leg swelling.  Gastrointestinal:  Negative for abdominal pain, blood in stool, constipation, diarrhea and nausea.  Endocrine: Negative for polydipsia.  Genitourinary:  Negative for dysuria, frequency, hematuria and urgency.  Musculoskeletal:  Negative for back pain, myalgias and neck pain.  Skin:  Negative for rash.  Allergic/Immunologic: Negative for environmental allergies.  Neurological:  Negative for dizziness and headaches.  Hematological:  Does not bruise/bleed easily.  Psychiatric/Behavioral:  Negative for  suicidal ideas. The patient is not nervous/anxious.     Patient Active Problem List   Diagnosis Date Noted   H/O total hysterectomy 12/22/2021   Lumbar spondylosis 09/20/2021   Right hip pain 09/20/2021   Skin nodule 06/30/2013    Allergies  Allergen Reactions   Latex Rash    Past Surgical History:  Procedure Laterality Date   ABDOMINAL HYSTERECTOMY     AUGMENTATION MAMMAPLASTY     BREAST BIOPSY Right    neg   CESAREAN SECTION     COLONOSCOPY WITH PROPOFOL N/A 11/24/2018   Procedure: COLONOSCOPY WITH PROPOFOL;  Surgeon: Jonathon Bellows, MD;  Location: [facility] ENDOSCOPY;  Service: Gastroenterology;  Laterality: N/A;   COLONOSCOPY WITH PROPOFOL N/A 07/03/2021   Procedure: COLONOSCOPY WITH PROPOFOL;  Surgeon: Jonathon Bellows, MD;  Location: [facility] ENDOSCOPY;  Service: Gastroenterology;  Laterality: N/A;   TOTAL ABDOMINAL HYSTERECTOMY      Social History   Tobacco Use   Smoking status: Never   Smokeless tobacco: Never  Vaping Use   Vaping Use: Never used  Substance Use Topics   Alcohol use: Yes   Drug use: Never     Medication list has been reviewed and updated.  Current Meds  Medication Sig   azelastine (ASTELIN) 0.1 % nasal spray Place 1 spray into both nostrils daily. Use in each nostril as directed   fluticasone (FLONASE) 50 MCG/ACT nasal spray Place 1 spray into both  nostrils daily as needed for allergies or rhinitis.   lisinopril-hydrochlorothiazide (ZESTORETIC) 10-12.5 MG tablet Take 1 tablet by mouth daily.   omeprazole (PRILOSEC) 20 MG capsule Take 20 mg by mouth daily.   triamcinolone cream (KENALOG) 0.1 %        03/13/2022    8:50 AM 12/22/2021    8:15 AM 10/27/2021    2:04 PM 09/20/2021    9:57 AM  GAD 7 : Generalized Anxiety Score  Nervous, Anxious, on Edge 0 0 0 0  Control/stop worrying 0 0 0 0  Worry too much - different things 0 0 0 0  Trouble relaxing 0 0 0 0  Restless 0 0 0 0  Easily annoyed or irritable 0 0 0 0  Afraid - awful might happen 0 0 0 0   Total GAD 7 Score 0 0 0 0  Anxiety Difficulty Not difficult at all Not difficult at all Not difficult at all Not difficult at all       03/13/2022    8:50 AM  Depression screen PHQ 2/9  Decreased Interest 0  Down, Depressed, Hopeless 0  PHQ - 2 Score 0  Altered sleeping 0  Tired, decreased energy 0  Change in appetite 0  Feeling bad or failure about yourself  0  Trouble concentrating 0  Moving slowly or fidgety/restless 0  Suicidal thoughts 0  PHQ-9 Score 0  Difficult doing work/chores Not difficult at all    BP Readings from Last 3 Encounters:  03/13/22 122/88  12/22/21 120/64  10/27/21 (!) 150/100    Physical Exam Vitals and nursing note reviewed.  Constitutional:      Appearance: She is well-developed, well-groomed and normal weight.  HENT:     Head: Normocephalic.     Jaw: There is normal jaw occlusion. No trismus, tenderness, swelling or pain on movement.     Right Ear: Hearing, tympanic membrane, ear canal and external ear normal.     Left Ear: Hearing, tympanic membrane, ear canal and external ear normal.     Nose: Nose normal.     Right Turbinates: Not enlarged, swollen or pale.     Left Turbinates: Not enlarged, swollen or pale.     Right Sinus: No frontal sinus tenderness.     Left Sinus: No frontal sinus tenderness.     Mouth/Throat:     Lips: Pink.     Mouth: Mucous membranes are moist.     Dentition: Normal dentition.     Tongue: No lesions.     Palate: No mass.     Pharynx: Oropharynx is clear.  Eyes:     General: Lids are normal. Vision grossly intact. Gaze aligned appropriately. No scleral icterus.    Extraocular Movements: Extraocular movements intact.     Conjunctiva/sclera: Conjunctivae normal.     Right eye: Right conjunctiva is not injected.     Left eye: Left conjunctiva is not injected.     Pupils: Pupils are equal, round, and reactive to light.  Neck:     Thyroid: No thyroid mass, thyromegaly or thyroid tenderness.     Vascular:  Normal carotid pulses. No carotid bruit, hepatojugular reflux or JVD.     Trachea: Trachea and phonation normal. No tracheal deviation.  Cardiovascular:     Rate and Rhythm: Normal rate and regular rhythm.     Chest Wall: PMI is not displaced.     Pulses: Normal pulses.          Radial pulses are  2+ on the right side and 2+ on the left side.       Dorsalis pedis pulses are 2+ on the right side and 2+ on the left side.       Posterior tibial pulses are 2+ on the right side and 2+ on the left side.     Heart sounds: Normal heart sounds, S1 normal and S2 normal. No murmur heard.    No systolic murmur is present.     No diastolic murmur is present.     No friction rub. No gallop. No S3 or S4 sounds.  Pulmonary:     Effort: Pulmonary effort is normal. No respiratory distress.     Breath sounds: Normal breath sounds and air entry. No decreased breath sounds, wheezing, rhonchi or rales.  Chest:  Breasts:    Breasts are asymmetrical.     Right: No swelling, bleeding, inverted nipple, nipple discharge, skin change or tenderness.     Left: No swelling, bleeding, inverted nipple, nipple discharge, skin change or tenderness.     Comments: Right capsualar contraction Abdominal:     General: Bowel sounds are normal.     Palpations: Abdomen is soft. There is no hepatomegaly, splenomegaly or mass.     Tenderness: There is no abdominal tenderness. There is no guarding or rebound.  Musculoskeletal:        General: No tenderness. Normal range of motion.     Cervical back: Full passive range of motion without pain, normal range of motion and neck supple.     Thoracic back: Deformity present.     Lumbar back: Deformity present.     Right lower leg: No edema.     Left lower leg: No edema.     Comments: scoliosis  Lymphadenopathy:     Head:     Right side of head: No submandibular or tonsillar adenopathy.     Left side of head: No submandibular or tonsillar adenopathy.     Cervical: No cervical  adenopathy.     Right cervical: No superficial, deep or posterior cervical adenopathy.    Left cervical: No superficial, deep or posterior cervical adenopathy.     Upper Body:     Right upper body: No supraclavicular or axillary adenopathy.     Left upper body: No supraclavicular or axillary adenopathy.  Skin:    General: Skin is warm.     Findings: No rash.  Neurological:     Mental Status: She is alert and oriented to person, place, and time.     Cranial Nerves: Cranial nerves 2-12 are intact. No cranial nerve deficit.     Sensory: Sensation is intact.     Motor: Motor function is intact.     Deep Tendon Reflexes: Reflexes normal.     Reflex Scores:      Patellar reflexes are 2+ on the right side and 2+ on the left side. Psychiatric:        Mood and Affect: Mood is not anxious or depressed.        Behavior: Behavior is cooperative.     Wt Readings from Last 3 Encounters:  03/13/22 148 lb (67.1 kg)  12/22/21 146 lb (66.2 kg)  10/27/21 150 lb (68 kg)    BP 122/88   Pulse 72   Ht 5' 7" (1.702 m)   Wt 148 lb (67.1 kg)   BMI 23.18 kg/m   Assessment and Plan: Marie Nguyen is a 66 y.o. female who presents today for  her Complete Annual Exam. She feels well. She reports exercising or walking during the day. She reports she is sleeping well.   1. Annual physical exam Immunizations are reviewed and recommendations provided.   Age appropriate screening tests are discussed. Counseling given for risk factor reduction interventions.  No subjective/objective concerns noted during history and physical exam. - POCT Occult Blood Stool  2. Familial hypercholesterolemia Patient with history of familial hypercholesterolemia.  3. H/O total hysterectomy With history of hysterectomy with absence of cervix.  4. Abnormal breast exam Right breast has capsulated and is firm with inability to adequately palpate breast tissue right breast tissue is palpated as acceptable.  5. Primary  hypertension Chronic.  Controlled.  Stable.  Blood pressure 122/88.  6. History of rectal bleeding Patient with history of rectal bleeding and we will check CBC for current stability of hemoglobin. - CBC with Differential/Platelet  7. Breast cancer screening by mammogram Previous screening has been done with no abnormalities. - POCT Occult Blood Stool - MS Digital Screening W/ Implants; Future  8. Need for shingles vaccine Discussed with patient and we will proceed with Shingrix vaccination - Varicella-zoster vaccine IM (Shingrix)

## 2022-03-14 LAB — CBC WITH DIFFERENTIAL/PLATELET
Basophils Absolute: 0.1 10*3/uL (ref 0.0–0.2)
Basos: 1 %
EOS (ABSOLUTE): 0.4 10*3/uL (ref 0.0–0.4)
Eos: 5 %
Hematocrit: 39.4 % (ref 34.0–46.6)
Hemoglobin: 14.3 g/dL (ref 11.1–15.9)
Immature Grans (Abs): 0 10*3/uL (ref 0.0–0.1)
Immature Granulocytes: 0 %
Lymphocytes Absolute: 1.2 10*3/uL (ref 0.7–3.1)
Lymphs: 17 %
MCH: 33.9 pg — ABNORMAL HIGH (ref 26.6–33.0)
MCHC: 36.3 g/dL — ABNORMAL HIGH (ref 31.5–35.7)
MCV: 93 fL (ref 79–97)
Monocytes Absolute: 0.8 10*3/uL (ref 0.1–0.9)
Monocytes: 11 %
Neutrophils Absolute: 4.7 10*3/uL (ref 1.4–7.0)
Neutrophils: 66 %
Platelets: 231 10*3/uL (ref 150–450)
RBC: 4.22 x10E6/uL (ref 3.77–5.28)
RDW: 11.8 % (ref 11.7–15.4)
WBC: 7.1 10*3/uL (ref 3.4–10.8)

## 2022-04-09 ENCOUNTER — Ambulatory Visit: Payer: BC Managed Care – PPO

## 2022-04-17 ENCOUNTER — Other Ambulatory Visit: Payer: Self-pay | Admitting: Family Medicine

## 2022-04-17 ENCOUNTER — Ambulatory Visit: Payer: BC Managed Care – PPO | Admitting: Family Medicine

## 2022-04-17 ENCOUNTER — Ambulatory Visit: Admission: RE | Admit: 2022-04-17 | Payer: BC Managed Care – PPO | Source: Ambulatory Visit

## 2022-04-17 DIAGNOSIS — N6314 Unspecified lump in the right breast, lower inner quadrant: Secondary | ICD-10-CM

## 2022-04-27 ENCOUNTER — Ambulatory Visit: Payer: BC Managed Care – PPO | Admitting: Family Medicine

## 2022-05-04 ENCOUNTER — Ambulatory Visit
Admission: RE | Admit: 2022-05-04 | Discharge: 2022-05-04 | Disposition: A | Discharge status: Home / Self Care | Payer: BC Managed Care – PPO | Source: Ambulatory Visit | Attending: Family Medicine | Admitting: Family Medicine

## 2022-05-04 ENCOUNTER — Other Ambulatory Visit: Payer: Self-pay | Admitting: Family Medicine

## 2022-05-04 DIAGNOSIS — R928 Other abnormal and inconclusive findings on diagnostic imaging of breast: Secondary | ICD-10-CM

## 2022-05-04 DIAGNOSIS — R920 Mammographic microcalcification found on diagnostic imaging of breast: Secondary | ICD-10-CM | POA: Diagnosis not present

## 2022-05-04 DIAGNOSIS — N6314 Unspecified lump in the right breast, lower inner quadrant: Secondary | ICD-10-CM

## 2022-05-07 ENCOUNTER — Other Ambulatory Visit: Payer: Self-pay | Admitting: Family Medicine

## 2022-05-07 DIAGNOSIS — N63 Unspecified lump in unspecified breast: Secondary | ICD-10-CM

## 2022-05-07 DIAGNOSIS — R928 Other abnormal and inconclusive findings on diagnostic imaging of breast: Secondary | ICD-10-CM

## 2022-05-07 DIAGNOSIS — R921 Mammographic calcification found on diagnostic imaging of breast: Secondary | ICD-10-CM

## 2022-05-16 ENCOUNTER — Ambulatory Visit
Admission: RE | Admit: 2022-05-16 | Discharge: 2022-05-16 | Disposition: A | Discharge status: Home / Self Care | Payer: BC Managed Care – PPO | Source: Ambulatory Visit | Attending: Family Medicine | Admitting: Family Medicine

## 2022-05-16 DIAGNOSIS — R928 Other abnormal and inconclusive findings on diagnostic imaging of breast: Secondary | ICD-10-CM

## 2022-05-16 DIAGNOSIS — C50411 Malignant neoplasm of upper-outer quadrant of right female breast: Secondary | ICD-10-CM | POA: Diagnosis not present

## 2022-05-16 DIAGNOSIS — R921 Mammographic calcification found on diagnostic imaging of breast: Secondary | ICD-10-CM

## 2022-05-16 DIAGNOSIS — N63 Unspecified lump in unspecified breast: Secondary | ICD-10-CM

## 2022-05-16 DIAGNOSIS — N6321 Unspecified lump in the left breast, upper outer quadrant: Secondary | ICD-10-CM | POA: Diagnosis not present

## 2022-05-16 DIAGNOSIS — Z170 Estrogen receptor positive status [ER+]: Secondary | ICD-10-CM | POA: Diagnosis not present

## 2022-05-16 DIAGNOSIS — C50311 Malignant neoplasm of lower-inner quadrant of right female breast: Secondary | ICD-10-CM | POA: Diagnosis not present

## 2022-05-16 DIAGNOSIS — C50811 Malignant neoplasm of overlapping sites of right female breast: Secondary | ICD-10-CM | POA: Diagnosis not present

## 2022-05-16 DIAGNOSIS — C50412 Malignant neoplasm of upper-outer quadrant of left female breast: Secondary | ICD-10-CM | POA: Diagnosis not present

## 2022-05-16 DIAGNOSIS — N6314 Unspecified lump in the right breast, lower inner quadrant: Secondary | ICD-10-CM | POA: Diagnosis not present

## 2022-05-16 HISTORY — PX: BREAST BIOPSY: SHX20

## 2022-05-17 ENCOUNTER — Encounter: Payer: Self-pay | Admitting: *Deleted

## 2022-05-17 DIAGNOSIS — C50919 Malignant neoplasm of unspecified site of unspecified female breast: Secondary | ICD-10-CM

## 2022-05-17 NOTE — Progress Notes (Signed)
Received referral for newly diagnosed breast cancer from [facility].  Navigation initiated.  Med Onc and Surgical consults scheduled. Dr. Hampton Abbot on 05/30/22 at 11:30 and Dr. Grayland Ormond 05/22/22 at 1:30.

## 2022-05-21 DIAGNOSIS — C50811 Malignant neoplasm of overlapping sites of right female breast: Secondary | ICD-10-CM | POA: Insufficient documentation

## 2022-05-21 NOTE — Progress Notes (Signed)
[place]  [phone] [phone]) [phone]  ID: Marie Nguyen OB: Sep 22, 1956  MR#: [id number]  CSN#:[id number]  Patient Care Team: Juline Patch, MD as PCP - General (Family Medicine) Christene Lye, MD (General Surgery) Luan Pulling Ronelle Nigh., MD (Inactive) (Family Medicine) Daiva Huge, RN as Oncology Nurse Navigator  CHIEF COMPLAINT: ER/PR positive, HER-2 equivocal multifocal right breast cancer, stage Ia ER/PR positive, HER2 equivocal left breast cancer.  INTERVAL HISTORY: Patient is a 66 year old female who noticed a lump in her right breast just below her implant.  Subsequent mammograms and biopsies revealed bilateral breast cancer.  She is anxious, but otherwise feels well.  She has no neurologic complaints.  She denies any recent fevers or illnesses.  She has a good appetite and denies weight loss.  She has no chest pain, shortness of breath, cough, or home this.  She denies any nausea, vomiting, constipation, or diarrhea.  She has no urinary complaints.  Patient otherwise feels well and offers no further specific complaints today.  REVIEW OF SYSTEMS:   Review of Systems  Constitutional: Negative.  Negative for fever, malaise/fatigue and weight loss.  Respiratory: Negative.  Negative for cough, hemoptysis and shortness of breath.   Cardiovascular: Negative.  Negative for chest pain, palpitations and leg swelling.  Gastrointestinal: Negative.  Negative for abdominal pain.  Genitourinary: Negative.  Negative for dysuria.  Musculoskeletal: Negative.  Negative for back pain.  Skin: Negative.  Negative for rash.  Neurological: Negative.  Negative for dizziness, focal weakness, weakness and headaches.  Psychiatric/Behavioral:  The patient is nervous/anxious.     As per HPI. Otherwise, a complete review of systems is negative.  PAST MEDICAL HISTORY: Past Medical History:  Diagnosis Date   Allergy    Eczema    GERD (gastroesophageal  reflux disease)    Heart murmur     PAST SURGICAL HISTORY: Past Surgical History:  Procedure Laterality Date   ABDOMINAL HYSTERECTOMY     AUGMENTATION MAMMAPLASTY     BREAST BIOPSY Right    neg   BREAST BIOPSY Right 05/16/2022   x 2 areas/ [place] bx & Stereo bx coil clip/path pending   BREAST BIOPSY Left 05/16/2022   [place] bx/ path pending   CESAREAN SECTION     COLONOSCOPY WITH PROPOFOL N/A 11/24/2018   Procedure: COLONOSCOPY WITH PROPOFOL;  Surgeon: Jonathon Bellows, MD;  Location[facility] ENDOSCOPY;  Service: Gastroenterology;  Laterality: N/A;   COLONOSCOPY WITH PROPOFOL N/A 07/03/2021   Procedure: COLONOSCOPY WITH PROPOFOL;  Surgeon: Jonathon Bellows, MD;  Location: [facility] ENDOSCOPY;  Service: Gastroenterology;  Laterality: N/A;   TOTAL ABDOMINAL HYSTERECTOMY      FAMILY HISTORY: Family History  Problem Relation Age of Onset   Breast cancer Mother    Cancer Mother    Hypertension Mother    Diabetes Father    Hypertension Father    Stroke Father    Hypertension Sister    Cancer Maternal Grandfather    Colon cancer Maternal Grandfather     ADVANCED DIRECTIVES (Y/N):  N  HEALTH MAINTENANCE: Social History   Tobacco Use   Smoking status: Never   Smokeless tobacco: Never  Vaping Use   Vaping Use: Never used  Substance Use Topics   Alcohol use: Yes   Drug use: Never     Colonoscopy:  PAP:  Bone density:  Lipid panel:  Allergies  Allergen Reactions   Latex Rash    Current Outpatient Medications  Medication Sig Dispense Refill   azelastine (  ASTELIN) 0.1 % nasal spray Place 1 spray into both nostrils daily. Use in each nostril as directed     fluticasone (FLONASE) 50 MCG/ACT nasal spray Place 1 spray into both nostrils daily as needed for allergies or rhinitis.     lisinopril-hydrochlorothiazide (ZESTORETIC) 10-12.5 MG tablet Take 1 tablet by mouth daily. 90 tablet 3   omeprazole (PRILOSEC) 20 MG capsule Take 20 mg by mouth daily.     triamcinolone cream (KENALOG) 0.1 %   (Patient not taking: Reported on 05/22/2022)     No current facility-administered medications for this visit.    OBJECTIVE: Vitals:   05/22/22 1352  BP: (!) 160/97  Pulse: 79  Resp: 16  Temp: 99.8 F (37.7 C)  SpO2: 98%     Body mass index is 22.82 kg/m.    ECOG FS:0 - Asymptomatic  General: Well-developed, well-nourished, no acute distress. Eyes: Pink conjunctiva, anicteric sclera. HEENT: Normocephalic, moist mucous membranes. Breasts: Bilateral implants.  Exam deferred today. Lungs: No audible wheezing or coughing. Heart: Regular rate and rhythm. Abdomen: Soft, nontender, no obvious distention. Musculoskeletal: No edema, cyanosis, or clubbing. Neuro: Alert, answering all questions appropriately. Cranial nerves grossly intact. Skin: No rashes or petechiae noted. Psych: Normal affect. Lymphatics: No cervical, calvicular, axillary or inguinal LAD.   LAB RESULTS:  Lab Results  Component Value Date   NA 144 12/22/2021   K 4.2 12/22/2021   CL 102 12/22/2021   CO2 23 12/22/2021   GLUCOSE 98 12/22/2021   BUN 15 12/22/2021   CREATININE 0.69 12/22/2021   CALCIUM 10.1 12/22/2021   PROT 6.9 03/10/2021   ALBUMIN 4.7 12/22/2021   AST 18 03/10/2021   ALT 16 03/10/2021   ALKPHOS 58 03/10/2021   BILITOT 1.3 (H) 03/10/2021   GFRNONAA 88 11/19/2018   GFRAA 102 11/19/2018    Lab Results  Component Value Date   WBC 7.1 03/13/2022   NEUTROABS 4.7 03/13/2022   HGB 14.3 03/13/2022   HCT 39.4 03/13/2022   MCV 93 03/13/2022   PLT 231 03/13/2022     STUDIES: [place] RT BREAST BX W LOC DEV 1ST LESION IMG BX SPEC US GUIDE  Addendum Date: 05/21/2022   ADDENDUM REPORT: 05/21/2022 07:39 ADDENDUM: PATHOLOGY revealed: Site A. BREAST MASS, RIGHT 5:00 4 CM FN (RIBBON); ULTRASOUND-GUIDED BIOPSY: - INVASIVE MAMMARY CARCINOMA, NO SPECIAL TYPE. 11 mm in this sample. Histologic grade of invasive carcinoma: Grade 3. Ductal carcinoma in situ: Present, intermediate grade. Lymphovascular invasion:  Not identified. Pathology results are CONCORDANT with imaging findings, per Dr. Lajean Manes. PATHOLOGY revealed: Site B. BREAST MASS, LEFT 1:00 3 CM FN (HEART); ULTRASOUND-GUIDED BIOPSY: - INVASIVE MAMMARY CARCINOMA, NO SPECIAL TYPE. 5 mm in this sample: Grade 2. Ductal carcinoma in situ: Present, intermediate grade. Lymphovascular invasion: Not identified. Pathology results are CONCORDANT with imaging findings, per Dr. Lajean Manes. PATHOLOGY revealed: Site C. 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 : Grade 1. Lymphovascular invasion: Not identified. Pathology results are CONCORDANT with imaging findings, per Dr. Lajean Manes. Pathology results and recommendations below were discussed with patient by telephone on 05/17/2022. Patient reported biopsy site within normal limits with slight tenderness at the site. Post biopsy care instructions were reviewed, questions were answered and my direct phone number was provided to patient. Patient was instructed to call [facility] if any concerns or questions arise related  to the biopsy. RECOMMENDATIONS: Surgical consultation. Request for surgical consultation relayed to Casper Harrison RN at [facility] by Electa Sniff RN on 05/17/2022. Pathology results reported by Electa Sniff RN on 05/18/2022. Electronically Signed   By: Lajean Manes M.D.   On: 05/21/2022 07:39   Result Date: 05/21/2022 CLINICAL DATA:  Patient presents for ultrasound-guided core needle biopsy a right breast mass, a left breast mass and stereotactic core needle biopsy of right breast calcifications. EXAM: ULTRASOUND GUIDED RIGHT BREAST CORE NEEDLE BIOPSY ULTRASOUND GUIDED LEFT BREAST CORE NEEDLE BIOPSY STEREOTACTIC GUIDED RIGHT BREAST CORE NEEDLE BIOPSY COMPARISON:  Previous exam(s).  PROCEDURE: I met with the patient and we discussed the procedure of ultrasound-guided biopsy, including benefits and alternatives. We discussed the high likelihood of a successful procedure. We discussed the risks of the procedure, including infection, bleeding, tissue injury, clip migration, and inadequate sampling. Informed written consent was given. The usual time-out protocol was performed immediately prior to the procedure. Biopsy #1: Mass in the right breast at 5 o'clock. Lesion quadrant: Lower inner quadrant Using sterile technique and 1% Lidocaine as local anesthetic, under direct ultrasound visualization, a 14 gauge spring-loaded device was used to perform biopsy of the irregular mass at 5 o'clock, 4 cm from nipple, using a lateral approach. At the conclusion of the procedure ribbon shaped tissue marker clip was deployed into the biopsy cavity. Biopsy #2: Mass in the left breast at 1 o'clock. Lesion quadrant: Upper outer quadrant Using sterile technique and 1% Lidocaine as local anesthetic, under direct ultrasound visualization, a 14 gauge spring-loaded device was used to perform biopsy of the irregular mass 1 o'clock, 3 cm the nipple, using a inferolateral approach. At the conclusion of the procedure heart shaped tissue marker clip was deployed into the biopsy cavity. Biopsy #3: Stereotactic biopsy of right breast calcifications. Lesion quadrant: Upper outer, anterior depth. The patient and I discussed the procedure of stereotactic-guided biopsy including benefits and alternatives. We discussed the high likelihood of a successful procedure. We discussed the risks of the procedure including infection, bleeding, tissue injury, clip migration, and inadequate sampling. Informed written consent was given. The usual time out protocol was performed immediately prior to the procedure. Using sterile technique and 1% Lidocaine as local anesthetic, under stereotactic guidance, a 9 gauge vacuum assisted device was  used to perform core needle biopsy of calcifications in the anterior upper outer quadrant of the right breast using a superior approach. Specimen radiograph was performed showing calcifications for which biopsy was performed. Specimens with calcifications are identified for pathology. Lesion quadrant: Upper outer quadrant At the conclusion of the procedure, a coil shaped tissue marker clip was deployed into the biopsy cavity. Follow up 2 view mammogram was performed and dictated separately. IMPRESSION: Ultrasound guided biopsy of a right breast mass. Ultrasound guided biopsy of a left breast mass. Stereotactic guided biopsy of right breast calcifications. No apparent complications. Electronically Signed: By: Lajean Manes M.D. On: 05/16/2022 11:50   [place] LT BREAST BX W LOC DEV 1ST LESION IMG BX SPEC US GUIDE  Addendum Date: 05/21/2022   ADDENDUM REPORT: 05/21/2022 07:39 ADDENDUM: PATHOLOGY revealed: Site A. BREAST MASS, RIGHT 5:00 4 CM FN (RIBBON); ULTRASOUND-GUIDED BIOPSY: - INVASIVE MAMMARY CARCINOMA, NO SPECIAL TYPE. 11 mm in this sample. Histologic grade of invasive carcinoma: Grade 3. Ductal carcinoma in situ: Present, intermediate grade. Lymphovascular invasion: Not identified. Pathology results are CONCORDANT with imaging findings, per Dr. Lajean Manes. PATHOLOGY revealed: Site B. BREAST MASS, LEFT 1:00 3 CM  FN (HEART); ULTRASOUND-GUIDED BIOPSY: - INVASIVE MAMMARY CARCINOMA, NO SPECIAL TYPE. 5 mm in this sample: Grade 2. Ductal carcinoma in situ: Present, intermediate grade. Lymphovascular invasion: Not identified. Pathology results are CONCORDANT with imaging findings, per Dr. Lajean Manes. PATHOLOGY revealed: Site C. 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 : Grade 1. Lymphovascular invasion: Not  identified. Pathology results are CONCORDANT with imaging findings, per Dr. Lajean Manes. Pathology results and recommendations below were discussed with patient by telephone on 05/17/2022. Patient reported biopsy site within normal limits with slight tenderness at the site. Post biopsy care instructions were reviewed, questions were answered and my direct phone number was provided to patient. Patient was instructed to call [facility] if any concerns or questions arise related to the biopsy. RECOMMENDATIONS: Surgical consultation. Request for surgical consultation relayed to Casper Harrison RN at [facility] by Electa Sniff RN on 05/17/2022. Pathology results reported by Electa Sniff RN on 05/18/2022. Electronically Signed   By: Lajean Manes M.D.   On: 05/21/2022 07:39   Result Date: 05/21/2022 CLINICAL DATA:  Patient presents for ultrasound-guided core needle biopsy a right breast mass, a left breast mass and stereotactic core needle biopsy of right breast calcifications. EXAM: ULTRASOUND GUIDED RIGHT BREAST CORE NEEDLE BIOPSY ULTRASOUND GUIDED LEFT BREAST CORE NEEDLE BIOPSY STEREOTACTIC GUIDED RIGHT BREAST CORE NEEDLE BIOPSY COMPARISON:  Previous exam(s). PROCEDURE: I met with the patient and we discussed the procedure of ultrasound-guided biopsy, including benefits and alternatives. We discussed the high likelihood of a successful procedure. We discussed the risks of the procedure, including infection, bleeding, tissue injury, clip migration, and inadequate sampling. Informed written consent was given. The usual time-out protocol was performed immediately prior to the procedure. Biopsy #1: Mass in the right breast at 5 o'clock. Lesion quadrant: Lower inner quadrant Using sterile technique and 1% Lidocaine as local anesthetic, under direct ultrasound visualization, a 14 gauge spring-loaded device was used to perform biopsy of the irregular mass at 5 o'clock, 4 cm from nipple, using a lateral  approach. At the conclusion of the procedure ribbon shaped tissue marker clip was deployed into the biopsy cavity. Biopsy #2: Mass in the left breast at 1 o'clock. Lesion quadrant: Upper outer quadrant Using sterile technique and 1% Lidocaine as local anesthetic, under direct ultrasound visualization, a 14 gauge spring-loaded device was used to perform biopsy of the irregular mass 1 o'clock, 3 cm the nipple, using a inferolateral approach. At the conclusion of the procedure heart shaped tissue marker clip was deployed into the biopsy cavity. Biopsy #3: Stereotactic biopsy of right breast calcifications. Lesion quadrant: Upper outer, anterior depth. The patient and I discussed the procedure of stereotactic-guided biopsy including benefits and alternatives. We discussed the high likelihood of a successful procedure. We discussed the risks of the procedure including infection, bleeding, tissue injury, clip migration, and inadequate sampling. Informed written consent was given. The usual time out protocol was performed immediately prior to the procedure. Using sterile technique and 1% Lidocaine as local anesthetic, under stereotactic guidance, a 9 gauge vacuum assisted device was used to perform core needle biopsy of calcifications in the anterior upper outer quadrant of the right breast using a superior approach. Specimen radiograph was performed showing calcifications for which biopsy was performed. Specimens with calcifications are identified for pathology. Lesion quadrant: Upper outer quadrant At the conclusion  of the procedure, a coil shaped tissue marker clip was deployed into the biopsy cavity. Follow up 2 view mammogram was performed and dictated separately. IMPRESSION: Ultrasound guided biopsy of a right breast mass. Ultrasound guided biopsy of a left breast mass. Stereotactic guided biopsy of right breast calcifications. No apparent complications. Electronically Signed: By: Lajean Manes M.D. On: 05/16/2022  11:50   MM RT BREAST BX W LOC DEV 1ST LESION IMAGE BX SPEC STEREO GUIDE  Addendum Date: 05/21/2022   ADDENDUM REPORT: 05/21/2022 07:39 ADDENDUM: PATHOLOGY revealed: Site A. BREAST MASS, RIGHT 5:00 4 CM FN (RIBBON); ULTRASOUND-GUIDED BIOPSY: - INVASIVE MAMMARY CARCINOMA, NO SPECIAL TYPE. 11 mm in this sample. Histologic grade of invasive carcinoma: Grade 3. Ductal carcinoma in situ: Present, intermediate grade. Lymphovascular invasion: Not identified. Pathology results are CONCORDANT with imaging findings, per Dr. Lajean Manes. PATHOLOGY revealed: Site B. BREAST MASS, LEFT 1:00 3 CM FN (HEART); ULTRASOUND-GUIDED BIOPSY: - INVASIVE MAMMARY CARCINOMA, NO SPECIAL TYPE. 5 mm in this sample: Grade 2. Ductal carcinoma in situ: Present, intermediate grade. Lymphovascular invasion: Not identified. Pathology results are CONCORDANT with imaging findings, per Dr. Lajean Manes. PATHOLOGY revealed: Site C. 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 : Grade 1. Lymphovascular invasion: Not identified. Pathology results are CONCORDANT with imaging findings, per Dr. Lajean Manes. Pathology results and recommendations below were discussed with patient by telephone on 05/17/2022. Patient reported biopsy site within normal limits with slight tenderness at the site. Post biopsy care instructions were reviewed, questions were answered and my direct phone number was provided to patient. Patient was instructed to call [facility] if any concerns or questions arise related to the biopsy. RECOMMENDATIONS: Surgical consultation. Request for surgical consultation relayed to Casper Harrison RN at [facility] by Electa Sniff RN on 05/17/2022. Pathology results reported by Electa Sniff RN on 05/18/2022. Electronically Signed    By: Lajean Manes M.D.   On: 05/21/2022 07:39   Result Date: 05/21/2022 CLINICAL DATA:  Patient presents for ultrasound-guided core needle biopsy a right breast mass, a left breast mass and stereotactic core needle biopsy of right breast calcifications. EXAM: ULTRASOUND GUIDED RIGHT BREAST CORE NEEDLE BIOPSY ULTRASOUND GUIDED LEFT BREAST CORE NEEDLE BIOPSY STEREOTACTIC GUIDED RIGHT BREAST CORE NEEDLE BIOPSY COMPARISON:  Previous exam(s). PROCEDURE: I met with the patient and we discussed the procedure of ultrasound-guided biopsy, including benefits and alternatives. We discussed the high likelihood of a successful procedure. We discussed the risks of the procedure, including infection, bleeding, tissue injury, clip migration, and inadequate sampling. Informed written consent was given. The usual time-out protocol was performed immediately prior to the procedure. Biopsy #1: Mass in the right breast at 5 o'clock. Lesion quadrant: Lower inner quadrant Using sterile technique and 1% Lidocaine as local anesthetic, under direct ultrasound visualization, a 14 gauge spring-loaded device was used to perform biopsy of the irregular mass at 5 o'clock, 4 cm from nipple, using a lateral approach. At the conclusion of the procedure ribbon shaped tissue marker clip was deployed into the biopsy cavity. Biopsy #2: Mass in the left breast at 1 o'clock. Lesion quadrant: Upper outer quadrant Using sterile technique and 1% Lidocaine as local anesthetic, under direct ultrasound visualization, a 14 gauge spring-loaded device was used to perform biopsy of the irregular mass 1 o'clock, 3 cm the nipple, using a inferolateral approach. At  the conclusion of the procedure heart shaped tissue marker clip was deployed into the biopsy cavity. Biopsy #3: Stereotactic biopsy of right breast calcifications. Lesion quadrant: Upper outer, anterior depth. The patient and I discussed the procedure of stereotactic-guided biopsy including benefits and  alternatives. We discussed the high likelihood of a successful procedure. We discussed the risks of the procedure including infection, bleeding, tissue injury, clip migration, and inadequate sampling. Informed written consent was given. The usual time out protocol was performed immediately prior to the procedure. Using sterile technique and 1% Lidocaine as local anesthetic, under stereotactic guidance, a 9 gauge vacuum assisted device was used to perform core needle biopsy of calcifications in the anterior upper outer quadrant of the right breast using a superior approach. Specimen radiograph was performed showing calcifications for which biopsy was performed. Specimens with calcifications are identified for pathology. Lesion quadrant: Upper outer quadrant At the conclusion of the procedure, a coil shaped tissue marker clip was deployed into the biopsy cavity. Follow up 2 view mammogram was performed and dictated separately. IMPRESSION: Ultrasound guided biopsy of a right breast mass. Ultrasound guided biopsy of a left breast mass. Stereotactic guided biopsy of right breast calcifications. No apparent complications. Electronically Signed: By: Lajean Manes M.D. On: 05/16/2022 11:50   MM CLIP PLACEMENT LEFT  Result Date: 05/16/2022 CLINICAL DATA:  Evaluate post biopsy marker clip placement following ultrasound-guided core needle biopsy of a right breast mass, ultrasound-guided core needle biopsy of left breast mass and stereotactic core needle biopsy right breast calcifications. EXAM: 3D DIAGNOSTIC RIGHT MAMMOGRAM POST ULTRASOUND BIOPSY 3D DIAGNOSTIC LEFT MAMMOGRAM POST ULTRASOUND BIOPSY 3D DIAGNOSTIC RIGHT MAMMOGRAM POST STEREOTACTIC BIOPSY COMPARISON:  Previous exam(s). FINDINGS: 3D bilateral mammographic images were obtained following ultrasound guided biopsy of right and left breast masses and stereotactic guided biopsy of right breast calcifications. The ribbon shaped biopsy clip lies within the mass lower  right breast, the coil shaped biopsy clip lies in the expected location of right breast calcifications, no residual calcifications visualized. The heart shaped biopsy clip lies within the mass the upper outer left breast. IMPRESSION: Appropriate positioning of the ribbon, coil and heart shaped biopsy marking clips at the site of biopsy as detailed above. Final Assessment: Post Procedure Mammograms for Marker Placement Electronically Signed   By: Lajean Manes M.D.   On: 05/16/2022 11:53  MM CLIP PLACEMENT RIGHT  Result Date: 05/16/2022 CLINICAL DATA:  Evaluate post biopsy marker clip placement following ultrasound-guided core needle biopsy of a right breast mass, ultrasound-guided core needle biopsy of left breast mass and stereotactic core needle biopsy right breast calcifications. EXAM: 3D DIAGNOSTIC RIGHT MAMMOGRAM POST ULTRASOUND BIOPSY 3D DIAGNOSTIC LEFT MAMMOGRAM POST ULTRASOUND BIOPSY 3D DIAGNOSTIC RIGHT MAMMOGRAM POST STEREOTACTIC BIOPSY COMPARISON:  Previous exam(s). FINDINGS: 3D bilateral mammographic images were obtained following ultrasound guided biopsy of right and left breast masses and stereotactic guided biopsy of right breast calcifications. The ribbon shaped biopsy clip lies within the mass lower right breast, the coil shaped biopsy clip lies in the expected location of right breast calcifications, no residual calcifications visualized. The heart shaped biopsy clip lies within the mass the upper outer left breast. IMPRESSION: Appropriate positioning of the ribbon, coil and heart shaped biopsy marking clips at the site of biopsy as detailed above. Final Assessment: Post Procedure Mammograms for Marker Placement Electronically Signed   By: Lajean Manes M.D.   On: 05/16/2022 11:53  MM DIAG BREAST W/IMPLANT TOMO BILATERAL  Result Date: 05/04/2022 CLINICAL DATA:  Patient presents for  bilateral diagnostic examination due to a palpable abnormality over the lower right breast. EXAM: DIGITAL  DIAGNOSTIC BILATERAL MAMMOGRAM WITH IMPLANTS, TOMOSYNTHESIS; ULTRASOUND LEFT BREAST LIMITED; ULTRASOUND RIGHT BREAST LIMITED TECHNIQUE: Bilateral digital diagnostic mammography and breast tomosynthesis was performed. Standard and/or implant displaced views were performed.; Targeted ultrasound examination of the left breast was performed.; Targeted ultrasound examination of the right breast was performed COMPARISON:  Previous exam(s). ACR Breast Density Category b: There are scattered areas of fibroglandular density. FINDINGS: Bilateral retropectoral silicone implants are intact. There is a spiculated mass with associated microcalcifications over the slightly medial lower right breast corresponding to patient's palpable abnormality. There is a 4 mm group of indeterminate microcalcifications over the outer midportion of the right retroareolar region. There is a small irregular mass with associated microcalcifications over the upper outer left periareolar region. Targeted ultrasound is performed, showing a hypoechoic mass with irregular shape and borders and internal microcalcifications over the 5 o'clock position of the right breast 4 cm from the nipple accounting for the mammographic and palpable abnormality. This measures proximally 1.3 x 1.6 x 1.6 cm. Ultrasound the left axilla is normal. Ultrasound over the 1 o'clock position of the left breast 3 cm from the nipple demonstrates a hypoechoic mass with irregular shape and borders corresponding to the mammographic finding and measuring 5 x 6 x 7 mm. Ultrasound of the left axilla is normal. IMPRESSION: 1. Suspicious 1.6 cm mass over the 5 o'clock position of the right breast corresponding to patient's palpable abnormality. No abnormal right axillary lymph nodes. 2. 4 mm group of indeterminate microcalcifications over the outer midportion of the right retroareolar region. 3. 7 mm suspicious mass over the 1 o'clock position of the left breast. No abnormal left axillary  lymph nodes. RECOMMENDATION: 1. Recommend ultrasound-guided core needle biopsy of the suspicious 1.6 cm mass at the 5 o'clock position of the right breast. 2. Recommend ultrasound-guided core needle biopsy of the suspicious 7 mm mass over the 1 o'clock position of the left breast. 3. Recommend attempted stereotactic core needle biopsy of the 4 mm group of indeterminate microcalcifications over the outer midportion of the right retroareolar region. If stereotactic biopsy not feasible, excisional biopsy may be warranted. I have discussed the findings and recommendations with the patient. If applicable, a reminder letter will be sent to the patient regarding the next appointment. BI-RADS CATEGORY  5: Highly suggestive of malignancy. Biopsies will be scheduled here at the [facility]. Electronically Signed   By: Marin Olp M.D.   On: 05/04/2022 16:34  US BREAST LTD UNI RIGHT INC AXILLA  Result Date: 05/04/2022 CLINICAL DATA:  Patient presents for bilateral diagnostic examination due to a palpable abnormality over the lower right breast. EXAM: DIGITAL DIAGNOSTIC BILATERAL MAMMOGRAM WITH IMPLANTS, TOMOSYNTHESIS; ULTRASOUND LEFT BREAST LIMITED; ULTRASOUND RIGHT BREAST LIMITED TECHNIQUE: Bilateral digital diagnostic mammography and breast tomosynthesis was performed. Standard and/or implant displaced views were performed.; Targeted ultrasound examination of the left breast was performed.; Targeted ultrasound examination of the right breast was performed COMPARISON:  Previous exam(s). ACR Breast Density Category b: There are scattered areas of fibroglandular density. FINDINGS: Bilateral retropectoral silicone implants are intact. There is a spiculated mass with associated microcalcifications over the slightly medial lower right breast corresponding to patient's palpable abnormality. There is a 4 mm group of indeterminate microcalcifications over the outer midportion of the right retroareolar region. There  is a small irregular mass with associated microcalcifications over the upper outer left periareolar region. Targeted ultrasound is performed, showing  a hypoechoic mass with irregular shape and borders and internal microcalcifications over the 5 o'clock position of the right breast 4 cm from the nipple accounting for the mammographic and palpable abnormality. This measures proximally 1.3 x 1.6 x 1.6 cm. Ultrasound the left axilla is normal. Ultrasound over the 1 o'clock position of the left breast 3 cm from the nipple demonstrates a hypoechoic mass with irregular shape and borders corresponding to the mammographic finding and measuring 5 x 6 x 7 mm. Ultrasound of the left axilla is normal. IMPRESSION: 1. Suspicious 1.6 cm mass over the 5 o'clock position of the right breast corresponding to patient's palpable abnormality. No abnormal right axillary lymph nodes. 2. 4 mm group of indeterminate microcalcifications over the outer midportion of the right retroareolar region. 3. 7 mm suspicious mass over the 1 o'clock position of the left breast. No abnormal left axillary lymph nodes. RECOMMENDATION: 1. Recommend ultrasound-guided core needle biopsy of the suspicious 1.6 cm mass at the 5 o'clock position of the right breast. 2. Recommend ultrasound-guided core needle biopsy of the suspicious 7 mm mass over the 1 o'clock position of the left breast. 3. Recommend attempted stereotactic core needle biopsy of the 4 mm group of indeterminate microcalcifications over the outer midportion of the right retroareolar region. If stereotactic biopsy not feasible, excisional biopsy may be warranted. I have discussed the findings and recommendations with the patient. If applicable, a reminder letter will be sent to the patient regarding the next appointment. BI-RADS CATEGORY  5: Highly suggestive of malignancy. Biopsies will be scheduled here at the [facility]. Electronically Signed   By: Marin Olp M.D.   On: 05/04/2022  16:34  US BREAST LTD UNI LEFT INC AXILLA  Result Date: 05/04/2022 CLINICAL DATA:  Patient presents for bilateral diagnostic examination due to a palpable abnormality over the lower right breast. EXAM: DIGITAL DIAGNOSTIC BILATERAL MAMMOGRAM WITH IMPLANTS, TOMOSYNTHESIS; ULTRASOUND LEFT BREAST LIMITED; ULTRASOUND RIGHT BREAST LIMITED TECHNIQUE: Bilateral digital diagnostic mammography and breast tomosynthesis was performed. Standard and/or implant displaced views were performed.; Targeted ultrasound examination of the left breast was performed.; Targeted ultrasound examination of the right breast was performed COMPARISON:  Previous exam(s). ACR Breast Density Category b: There are scattered areas of fibroglandular density. FINDINGS: Bilateral retropectoral silicone implants are intact. There is a spiculated mass with associated microcalcifications over the slightly medial lower right breast corresponding to patient's palpable abnormality. There is a 4 mm group of indeterminate microcalcifications over the outer midportion of the right retroareolar region. There is a small irregular mass with associated microcalcifications over the upper outer left periareolar region. Targeted ultrasound is performed, showing a hypoechoic mass with irregular shape and borders and internal microcalcifications over the 5 o'clock position of the right breast 4 cm from the nipple accounting for the mammographic and palpable abnormality. This measures proximally 1.3 x 1.6 x 1.6 cm. Ultrasound the left axilla is normal. Ultrasound over the 1 o'clock position of the left breast 3 cm from the nipple demonstrates a hypoechoic mass with irregular shape and borders corresponding to the mammographic finding and measuring 5 x 6 x 7 mm. Ultrasound of the left axilla is normal. IMPRESSION: 1. Suspicious 1.6 cm mass over the 5 o'clock position of the right breast corresponding to patient's palpable abnormality. No abnormal right axillary lymph  nodes. 2. 4 mm group of indeterminate microcalcifications over the outer midportion of the right retroareolar region. 3. 7 mm suspicious mass over the 1 o'clock position of the left breast.  No abnormal left axillary lymph nodes. RECOMMENDATION: 1. Recommend ultrasound-guided core needle biopsy of the suspicious 1.6 cm mass at the 5 o'clock position of the right breast. 2. Recommend ultrasound-guided core needle biopsy of the suspicious 7 mm mass over the 1 o'clock position of the left breast. 3. Recommend attempted stereotactic core needle biopsy of the 4 mm group of indeterminate microcalcifications over the outer midportion of the right retroareolar region. If stereotactic biopsy not feasible, excisional biopsy may be warranted. I have discussed the findings and recommendations with the patient. If applicable, a reminder letter will be sent to the patient regarding the next appointment. BI-RADS CATEGORY  5: Highly suggestive of malignancy. Biopsies will be scheduled here at the [facility]. Electronically Signed   By: Marin Olp M.D.   On: 05/04/2022 16:34   ASSESSMENT: ER/PR positive, HER-2 equivocal multifocal right breast cancer, stage Ia ER/PR positive, HER2 equivocal left breast cancer.  PLAN:    ER/PR positive, HER-2 equivocal multifocal right breast cancer, stage Ia ER/PR positive, HER2 equivocal left breast cancer: Mammogram and ultrasound results reviewed independently.  Case also discussed with pathology who feels that these are 2 separate primaries.  Suspect patient will require removal of her implants which were placed greater than 30 years ago followed by bilateral mastectomy.  She has an appointment with surgery on May 30, 2022 to further discuss surgical options.  Awaiting HER2 results to determine if chemotherapy is necessary.  If HER2 is negative, will send Oncotype.  At the conclusions of all patient's treatments, she will require an aromatase inhibitor for a minimum of 5  years.  Finally, have also recommended genetic testing given the bilateral nature of her disease.  No follow-up has been scheduled at this time, will await surgical input to determine patient's next clinic visit.  I spent a total of 60 minutes reviewing chart data, face-to-face evaluation with the patient, counseling and coordination of care as detailed above.   Patient expressed understanding and was in agreement with this plan. She also understands that She can call clinic at any time with any questions, concerns, or complaints.    Cancer Staging  No matching staging information was found for the patient.  Lloyd Huger, MD   05/25/2022 9:23 AM

## 2022-05-22 ENCOUNTER — Inpatient Hospital Stay: Payer: BC Managed Care – PPO | Attending: Oncology | Admitting: Oncology

## 2022-05-22 ENCOUNTER — Encounter: Payer: Self-pay | Admitting: Oncology

## 2022-05-22 ENCOUNTER — Inpatient Hospital Stay: Payer: BC Managed Care – PPO

## 2022-05-22 ENCOUNTER — Encounter: Payer: Self-pay | Admitting: *Deleted

## 2022-05-22 DIAGNOSIS — C50811 Malignant neoplasm of overlapping sites of right female breast: Secondary | ICD-10-CM

## 2022-05-22 DIAGNOSIS — Z170 Estrogen receptor positive status [ER+]: Secondary | ICD-10-CM

## 2022-05-22 DIAGNOSIS — C50412 Malignant neoplasm of upper-outer quadrant of left female breast: Secondary | ICD-10-CM | POA: Diagnosis not present

## 2022-05-22 DIAGNOSIS — C50912 Malignant neoplasm of unspecified site of left female breast: Secondary | ICD-10-CM | POA: Diagnosis not present

## 2022-05-22 DIAGNOSIS — C50311 Malignant neoplasm of lower-inner quadrant of right female breast: Secondary | ICD-10-CM | POA: Diagnosis not present

## 2022-05-22 NOTE — Progress Notes (Signed)
Met with patient and sister at initial med onc appointment. Accompanied patient and family to initial medical oncology appointment.   Reviewed Breast Cancer treatment handbook.   Care plan summary given to patient.

## 2022-05-24 ENCOUNTER — Encounter: Payer: Self-pay | Admitting: Diagnostic Radiology

## 2022-05-24 LAB — SURGICAL PATHOLOGY

## 2022-05-30 ENCOUNTER — Encounter: Payer: Self-pay | Admitting: Surgery

## 2022-05-30 ENCOUNTER — Ambulatory Visit: Payer: BC Managed Care – PPO | Admitting: Surgery

## 2022-05-30 VITALS — BP 154/88 | HR 67 | Temp 98.0°F | Ht 67.0 in | Wt 142.0 lb

## 2022-05-30 DIAGNOSIS — Z170 Estrogen receptor positive status [ER+]: Secondary | ICD-10-CM | POA: Diagnosis not present

## 2022-05-30 DIAGNOSIS — C50811 Malignant neoplasm of overlapping sites of right female breast: Secondary | ICD-10-CM | POA: Diagnosis not present

## 2022-05-30 DIAGNOSIS — C50412 Malignant neoplasm of upper-outer quadrant of left female breast: Secondary | ICD-10-CM | POA: Diagnosis not present

## 2022-05-30 MED ORDER — LIDOCAINE-PRILOCAINE 2.5-2.5 % EX CREA: TOPICAL_CREAM | CUTANEOUS | 0 refills | Status: DC

## 2022-05-30 NOTE — Patient Instructions (Addendum)
We will send a referral to Plastic Surgery so you may have a discussion with them to be further informed about what you may like to do during or after surgery and what your options are. They will call you to schedule this appointment.   We have spoken today about breast surgery. Your Mastectomy will be scheduled at [facility] with Dr. Hampton Abbot.  You may spend at least 1 night in the hospital following surgery and go home with 2-4 drains for approximately 5-7 days following your surgery. Please keep an accurate record of your drain amount in ml's or cc's. If your drain suddenly stops draining or has drainage around the tube at the skin, call our office and speak with a nurse immediately.  Please see the (blue) pre-care surgery sheet that you have been given today for more information regarding your surgery. Our surgery scheduler will call you once you have seen Plastic Surgery to look at surgery dates and to go over information about your surgery.    Sentinel Lymph Node Biopsy in Breast Cancer Treatment Sentinel lymph node biopsy is a procedure to identify, remove, and examine one or more lymph nodes for cancer. Lymph is fluid from the tissues in the body. It is removed through the lymphatic system. This system is part of the body's defense system (immune system) and includes lymph nodes and lymph vessels. Cancer can spread to nearby lymph nodes. It usually spreads to one lymph node first, and then to others. The first lymph node that the cancer could spread to is called the sentinel lymph node. In some cases, there may be more than one sentinel lymph node. If you have breast cancer, you may have this procedure to determine whether your cancer has spread. For breast cancer, a sentinel lymph node is usually in the armpit because that is where breast cancer tends to spread first. If no cancer is found in a sentinel lymph node, it is very unlikely that the cancer has spread to any of the other lymph nodes. If cancer  is found in a sentinel lymph node, your surgeon may remove additional lymph nodes for examination. What are the risks? Generally, this is a safe procedure. However, problems may occur, including: Infection. Allergic reactions to medicines or dyes. Staining of the skin where the dye is injected. Damaged lymph vessels, causing a buildup of fluid (lymphedema). Damage to nearby structures or organs. Pain, bleeding, or bruising where a lymph node was removed (biopsy site). A false-negative biopsy. This is when cancer cells are not found in the sentinel node, but the cancer has spread to other nodes in the area. What happens before the procedure? General instructions Do not use any products that contain nicotine or tobacco for at least 4 weeks before the procedure. These products include cigarettes, chewing tobacco, and vaping devices, such as e-cigarettes. If you need help quitting, ask your health care provider. You may have blood tests to make sure your blood clots normally. You may have screening tests or exams to get baseline measurements of your arm. These can be compared to measurements done after surgery to monitor for swelling (lymphedema) that can develop after having lymph nodes removed. If you will be going home right after the procedure, plan to have a responsible adult: Take you home from the hospital or clinic. You will not be allowed to drive. Care for you for the time you are told. Ask your health care provider: How your surgery site will be marked. What steps will  be taken to help prevent infection. These steps may include: Washing skin with a germ-killing soap. Taking antibiotic medicine. What happens during the procedure?  An IV will be inserted into one of your veins. You will be given one or more of the following: A medicine to help you relax (sedative). A medicine to numb the area (local anesthetic). A medicine to make you fall asleep (general anesthetic). Blue dye, a  radioactive substance, or both will be injected around the tumor in your breast. Both the dye and the radioactive substance will follow the same path that a spreading cancer would be likely to follow. The blue dye will reach your lymph node quickly. It may be given just before surgery. The radioactive substance will take longer to reach your lymph nodes, so it may be given before you go into the operating area. A scanner will show where the substance has spread. This will help identify the sentinel lymph node. The surgeon will make a small incision. If blue dye was injected, your surgeon will look for any lymph nodes that have picked up the dye. Sentinel lymph nodes will be removed and sent to a lab for examination. If no cancer is found, no other lymph nodes will be removed. This means it is unlikely that the cancer has spread to other lymph nodes. If cancer is found, the surgeon will remove other lymph nodes in the armpit for examination. This may happen during the same procedure or at a later time. The incision will be closed with stitches (sutures) or metal clips. Small adhesive strip bandages may be used to keep the skin edges close together. A small dressing may be taped over the incision area. The procedure may vary among health care providers and hospitals.   Skin Sparing Mastectomy A mastectomy is a surgery to remove a breast. Skin sparing mastectomy (SSM) is a technique that can be used during a mastectomy. When this technique is used, all of the skin over the breast is left intact, while all the underlying breast tissue, the nipple, and sometimes the areola are removed. The technique allows the breast to be reconstructed during the surgery. Most women with breast cancer can have this type of surgery unless their cancer is close to or involves the skin over the breast. Tell a health care provider about: Any allergies you have. All medicines you are taking, including vitamins, herbs,  supplements, eye drops, creams, and over-the-counter medicines. Any problems you or family members have had with anesthetic medicines. Any bleeding problems you have. Any surgeries you have had. Any medical conditions you have. Whether you are pregnant or may be pregnant. What are the risks? Generally, this is a safe procedure. However, problems may occur, including: Infection. Bleeding. Allergic reactions to medicines or dyes. The formation of a pocket of clear fluid (seroma). A skin infection caused by bacteria (cellulitis). Damage to nearby structures or organs. A collapsed lung (pneumothorax). This is rare. Other risks include: Death of tissue (necrosis). People who use tobacco products are at increased risk for this problem. Blood clots. Nerve damage due to poor positioning of the arm during surgery. What happens before the procedure?  If you do not follow your health care provider's instructions, your procedure may be delayed or canceled. Medicines Ask your health care provider about: Changing or stopping your regular medicines. These include any diabetes medicines or blood thinners you take. Taking medicines such as aspirin and ibuprofen. These medicines can thin your blood. Do not take these  medicines unless your health care provider tells you to take them. Taking over-the-counter medicines, vitamins, herbs, and supplements. Surgery safety Ask your health care provider: How your surgery site will be marked. What steps will be taken to help prevent infection. These may include: Washing skin with a germ-killing soap. Taking antibiotic medicine. General instructions You will most often be checked for abnormal lymph nodes in the area around the affected breast during the surgery. Do not use any products that contain nicotine or tobacco for at least 4 weeks before the procedure. These products include cigarettes, chewing tobacco, and vaping devices, such as e-cigarettes. If you  need help quitting, ask your health care provider. If you will be going home right after the procedure, plan to have a responsible adult: Take you home from the hospital or clinic. You will not be allowed to drive. Care for you for the time you are told. What happens during the procedure?  An IV will be inserted into one of your veins. You will be given one or more of the following: A medicine to help you relax (sedative). A medicine to numb the area (local anesthetic). A medicine to make you fall asleep (general anesthetic). An incision will be made. The underlying breast tissue, the nipple, and possibly the areola will be removed. Tissue from the area may be sent to be examined under a microscope to look for cancer cells (biopsy). Lymph nodes in the area may be checked for cancer and removed if necessary. Skin around the breast will be preserved to form a pocket with flaps that will be developed to insert a breast implant. An implant or tissue from another part of your body may be inserted into the pocket made by the flaps. A tube will be placed to drain blood and fluids that collect in the surgical area. The incision will be closed with sutures (stitches), skin glue, or skin adhesive strips. A gauze bandage (dressing) will be put over the incision. The procedure may vary among health care providers and hospitals. What happens after the procedure? Your blood pressure, heart rate, breathing rate, and blood oxygen level will be monitored until you leave the hospital or clinic. You will be given pain medicine as needed. You may stay at the hospital at least overnight so that your health care providers can make sure. You are able to tolerate pain-relieving medicines and other medicines. You are able to eat. You are hydrated. You will be told how to care for your drain and incisions at home. Do not drive until your health care provider says that it is safe. Summary Skin sparing mastectomy  is a technique that allows the breast to be reconstructed during a surgery that is done to remove a breast. For the procedure, you will be given a medicine to make you fall asleep. You may need to stay in the hospital at least overnight after the procedure. This information is not intended to replace advice given to you by your health care provider. Make sure you discuss any questions you have with your health care provider. Document Revised: 10/16/2021 Document Reviewed: 10/16/2021 Elsevier Patient Education  [place].

## 2022-05-30 NOTE — Progress Notes (Signed)
05/30/2022  Reason for Visit:  Bilateral breast cancer  Requesting Provider:  Otilio Miu, MD  History of Present Illness: Marie Nguyen is a 66 y.o. female presenting for evaluation of bilateral breast cancer.  She has a history of bilateral breast implants about 33 years ago.  The patient saw her PCP reporting she could feel a mass on her right breast.  She had a mammogram on 05/04/22 which showed a spiculated mass in the right breast about 5 o'clock position, 4 cm from nipple, in addition to a group of calcifications in the right breast as well in the upper outer quadrant near retroareolar location.  On the left breast, it also found a mass with irregular shape and borders at 1 o'clock position, 3 cm from nipple.  All three areas were biopsied and all three were positive for invasive breast cancer.  Both axilla were normal in appearance on ultrasound without any abnormal lymph nodes seen.  She reports bruising over bilateral breast biopsy sites, particularly in the right upper outer quadrant area.  She saw Dr. Grayland Ormond on 05/22/22.  They discussed the need for implant removal and patient has opted for bilateral mastectomies.  She would also need bilateral sentinel lymph node biopsies.  She would need endocrine therapy after surgery.  There is history of breast cancer in her mother.  She had a mammogram in June 2022 which did not show any abnormalities.  Past Medical History: Past Medical History:  Diagnosis Date   Allergy    Eczema    GERD (gastroesophageal reflux disease)    Heart murmur      Past Surgical History: Past Surgical History:  Procedure Laterality Date   ABDOMINAL HYSTERECTOMY     AUGMENTATION MAMMAPLASTY     BREAST BIOPSY Right    neg   BREAST BIOPSY Right 05/16/2022   x 2 areas/ [place] bx & Stereo bx coil clip/path pending   BREAST BIOPSY Left 05/16/2022   [place] bx/ path pending   CESAREAN SECTION     COLONOSCOPY WITH PROPOFOL N/A 11/24/2018   Procedure: COLONOSCOPY  WITH PROPOFOL;  Surgeon: Jonathon Bellows, MD;  Location: [facility] ENDOSCOPY;  Service: Gastroenterology;  Laterality: N/A;   COLONOSCOPY WITH PROPOFOL N/A 07/03/2021   Procedure: COLONOSCOPY WITH PROPOFOL;  Surgeon: Jonathon Bellows, MD;  Location: [facility] ENDOSCOPY;  Service: Gastroenterology;  Laterality: N/A;   TOTAL ABDOMINAL HYSTERECTOMY      Home Medications: Prior to Admission medications   Medication Sig Start Date End Date Taking? Authorizing Provider  azelastine (ASTELIN) 0.1 % nasal spray Place 1 spray into both nostrils daily. Use in each nostril as directed   Yes [provider]  fluticasone (FLONASE) 50 MCG/ACT nasal spray Place 1 spray into both nostrils daily as needed for allergies or rhinitis.   Yes [provider]  lidocaine-prilocaine (EMLA) cream Apply to the areola of both breasts and cover with plastic wrap one hour prior to leaving for surgery. 05/30/22  Yes Marvel Sapp, Jacqulyn Bath, MD  lisinopril-hydrochlorothiazide (ZESTORETIC) 10-12.5 MG tablet Take 1 tablet by mouth daily. 10/27/21  Yes Juline Patch, MD  omeprazole (PRILOSEC) 20 MG capsule Take 20 mg by mouth daily.   Yes [provider]  triamcinolone cream (KENALOG) 0.1 % 1 Application as needed. 04/10/17  Yes [provider]    Allergies: Allergies  Allergen Reactions   Latex Rash    Social History:  reports that she has never smoked. She has never been exposed to tobacco smoke. She has never used  smokeless tobacco. She reports current alcohol use. She reports that she does not use drugs.   Family History: Family History  Problem Relation Age of Onset   Breast cancer Mother    Cancer Mother    Hypertension Mother    Diabetes Father    Hypertension Father    Stroke Father    Hypertension Sister    Cancer Maternal Grandfather    Colon cancer Maternal Grandfather     Review of Systems: Review of Systems  Constitutional:  Negative for chills, fever and weight loss.  HENT:  Negative for  hearing loss.   Respiratory:  Negative for shortness of breath.   Cardiovascular:  Negative for chest pain.  Gastrointestinal:  Negative for abdominal pain, nausea and vomiting.  Genitourinary:  Negative for dysuria.  Musculoskeletal:  Negative for myalgias.  Skin:  Negative for rash.       Bruising on bilateral breasts at biopsy sites.  Neurological:  Negative for dizziness.  Psychiatric/Behavioral:  Negative for depression.     Physical Exam BP (!) 154/88   Pulse 67   Temp 98 F (36.7 C)   Ht $R'5\' 7"'zY$  (1.702 m)   Wt 142 lb (64.4 kg)   SpO2 100%   BMI 22.24 kg/m  CONSTITUTIONAL: No acute distress, well nourished. HEENT:  Normocephalic, atraumatic, extraocular motion intact. NECK: Trachea is midline, and there is no jugular venous distension.  RESPIRATORY:  Lungs are clear, and breath sounds are equal bilaterally. Normal respiratory effort without pathologic use of accessory muscles. CARDIOVASCULAR: Heart is regular without murmurs, gallops, or rubs. BREAST:  Right breast with area of ecchymosis over the upper portion of the breast as well as small area inferiorly, all these over biopsy sites.  No other skin changes, and can somewhat palpate the 5 o'clock mass.  Patient's skin in tight due to implant and complication from it.  No nipple changes.  No right axillary lymphadenopathy.  Left breast with mild ecchymosis at biopsy site, but otherwise no tightness compared to right side, and no palpable masses, skin changes, or nipple changes.  No left axillary lymphadenopathy. MUSCULOSKELETAL:  Normal muscle strength and tone in all four extremities.  No peripheral edema or cyanosis. SKIN: Skin turgor is normal. There are no pathologic skin lesions.  NEUROLOGIC:  Motor and sensation is grossly normal.  Cranial nerves are grossly intact. PSYCH:  Alert and oriented to person, place and time. Affect is normal.  Laboratory Analysis: Pathology results on 05/16/22: DIAGNOSIS:  A.  BREAST MASS,  RIGHT 5:00 4 CM FN (RIBBON); ULTRASOUND-GUIDED BIOPSY:  - INVASIVE MAMMARY CARCINOMA, NO SPECIAL TYPE.  Size of invasive carcinoma: 11 mm in this sample  Histologic grade of invasive carcinoma: Grade 3                       Glandular/tubular differentiation score: 3                       Nuclear pleomorphism score: 3                       Mitotic rate score: 2                       Total score: 8  Ductal carcinoma in situ: Present, intermediate grade  Lymphovascular invasion: Not identified   B.  BREAST MASS, LEFT 1:00 3 CM FN (HEART); ULTRASOUND-GUIDED BIOPSY:  - INVASIVE MAMMARY  CARCINOMA, NO SPECIAL TYPE.  Size of invasive carcinoma: 5 mm in this sample  Histologic grade of invasive carcinoma: Grade 2                       Glandular/tubular differentiation score: 3                       Nuclear pleomorphism score: 2                       Mitotic rate score: 1                       Total score: 6  Ductal carcinoma in situ: Present, intermediate grade  Lymphovascular invasion: Not identified   C.  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  Histologic grade of invasive carcinoma: Grade 1                       Glandular/tubular differentiation score: 2                       Nuclear pleomorphism score: 2                       Mitotic rate score: 1                       Total score: 5  Lymphovascular invasion: Not identified   CASE SUMMARY: BREAST BIOMARKER TESTS - Part A: Right 5:00 4 CM FN (Ribbon)  Estrogen Receptor (ER) Status: POSITIVE          Percentage of cells with nuclear positivity: Greater than 90%          Average intensity of staining: Strong  Progesterone Receptor (PgR) Status: POSITIVE          Percentage of cells with nuclear positivity: Greater than 90%           Average intensity of staining: Strong  HER2 (by immunohistochemistry): EQUIVOCAL (Score 2+)          Percentage of cells with uniform intense complete membrane staining: 0%  HER2 FISH will be performed and reported in an addendum.  Ki-67: Not performed   CASE SUMMARY: BREAST BIOMARKER TESTS - Part B: Left 1:00 3 CM FN (Heart)  Estrogen Receptor (ER) Status: POSITIVE          Percentage of cells with nuclear positivity: Greater than 90%          Average intensity of staining: Strong  Progesterone Receptor (PgR) Status: POSITIVE          Percentage of cells with nuclear positivity: 51 - 90%          Average intensity of staining: Moderate  HER2 (by immunohistochemistry): EQUIVOCAL (Score 2+)          Percentage of cells with uniform intense complete membrane staining: 0%  HER2 FISH will be performed and reported in an addendum.  Ki-67: Not performed   CASE SUMMARY: BREAST BIOMARKER TESTS - Part C: Right anterior upper outer (Coil)  Estrogen Receptor (ER) Status: POSITIVE          Percentage of cells with nuclear positivity: Greater than 90%  Average intensity of staining: Strong  Progesterone Receptor (PgR) Status: POSITIVE          Percentage of cells with nuclear positivity: 1 - 10%          Average intensity of staining: Moderate  HER2 (by immunohistochemistry): EQUIVOCAL (Score 2+)          Percentage of cells with uniform intense complete membrane staining: 0%  HER2 FISH will be performed and reported in an addendum.  Ki-67: Not performed   Imaging: Bilateral mammogram and ultrasound on 05/04/22: FINDINGS: Bilateral retropectoral silicone implants are intact. There is a spiculated mass with associated microcalcifications over the slightly medial lower right breast corresponding to patient's palpable abnormality. There is a 4 mm group of indeterminate microcalcifications over the outer midportion of the right retroareolar region.   There is a small irregular mass with  associated microcalcifications over the upper outer left periareolar region.   Targeted ultrasound is performed, showing a hypoechoic mass with irregular shape and borders and internal microcalcifications over the 5 o'clock position of the right breast 4 cm from the nipple accounting for the mammographic and palpable abnormality. This measures approximally 1.3 x 1.6 x 1.6 cm.   Ultrasound the left axilla is normal.   Ultrasound over the 1 o'clock position of the left breast 3 cm from the nipple demonstrates a hypoechoic mass with irregular shape and borders corresponding to the mammographic finding and measuring 5 x 6 x 7 mm.   Ultrasound of the left axilla is normal.   IMPRESSION: 1. Suspicious 1.6 cm mass over the 5 o'clock position of the right breast corresponding to patient's palpable abnormality. No abnormal right axillary lymph nodes.   2. 4 mm group of indeterminate microcalcifications over the outer midportion of the right retroareolar region.   3. 7 mm suspicious mass over the 1 o'clock position of the left breast. No abnormal left axillary lymph nodes.   RECOMMENDATION: 1. Recommend ultrasound-guided core needle biopsy of the suspicious 1.6 cm mass at the 5 o'clock position of the right breast.   2. Recommend ultrasound-guided core needle biopsy of the suspicious 7 mm mass over the 1 o'clock position of the left breast.   3. Recommend attempted stereotactic core needle biopsy of the 4 mm group of indeterminate microcalcifications over the outer midportion of the right retroareolar region. If stereotactic biopsy not feasible, excisional biopsy may be warranted.  BI-RADS CATEGORY  5: Highly suggestive of malignancy.    Assessment and Plan: This is a 66 y.o. female with bilateral breast cancer.  --Discussed with the patient about the mammogram and biopsy results and that there are two sites of cancer on the right breast and one on the left breast.  Given the different  locations on the right, agree that a right mastectomy would be needed, with sentinel lymph node biopsy.  Discussed with patient option for lumpectomy vs mastectomy and lymph node biopsy on the left and the patient has opted for bilateral simple mastectomies and sentinel lymph node biopsies.  Discussed with her about the need to remove the breast implants, and will send a referral to Dr. Marla Roe to evaluate her for this.  The patient is also interested in doing immediate reconstruction.  I'm not sure if that's feasible given the implant removal, but will defer to Dr. Marla Roe.  Overall would aim for a combined case approach on the same day to remove her implants, do her mastectomies and sentinel lymph node biopsies, and reconstructions based on her  discussion and plan with Dr. Marla Roe.  The patient is in agreement. --Discussed with my portion of the surgery at length including the risks of bleeding, infection, injury to surrounding structures, risk of lymphedema, drains, needing an overnight stay, post-op activity restrictions, pain control, and she's willing to proceed. --Will send referral for Dr. Marla Roe and our offices will coordinate for surgery together.  Once date is set will contact patient to update. --All questions have been answered.  I spent 55 minutes dedicated to the care of this patient on the date of this encounter to include pre-visit review of records, face-to-face time with the patient discussing diagnosis and management, and any post-visit coordination of care.   Melvyn Neth, [facility]

## 2022-06-01 ENCOUNTER — Ambulatory Visit: Payer: BC Managed Care – PPO | Admitting: Family Medicine

## 2022-06-01 ENCOUNTER — Encounter: Payer: Self-pay | Admitting: Family Medicine

## 2022-06-01 VITALS — BP 110/82 | HR 64 | Ht 67.0 in | Wt 142.0 lb

## 2022-06-01 DIAGNOSIS — Z23 Encounter for immunization: Secondary | ICD-10-CM | POA: Diagnosis not present

## 2022-06-01 DIAGNOSIS — I10 Essential (primary) hypertension: Secondary | ICD-10-CM

## 2022-06-01 MED ORDER — LISINOPRIL-HYDROCHLOROTHIAZIDE 10-12.5 MG PO TABS: 1.0000 | ORAL_TABLET | Freq: Every day | ORAL | 1 refills | Status: DC

## 2022-06-01 NOTE — Progress Notes (Signed)
Date:  06/01/2022   Name:  Marie Nguyen   DOB:  [birth year]   MRN:  [id number]   Chief Complaint: Hypertension  Hypertension This is a chronic problem. The current episode started more than 1 year ago. The problem has been gradually improving since onset. The problem is controlled. Pertinent negatives include no anxiety, blurred vision, chest pain, headaches, malaise/fatigue, neck pain, orthopnea, palpitations, peripheral edema, PND, shortness of breath or sweats. There are no associated agents to hypertension. There are no known risk factors for coronary artery disease. Past treatments include ACE inhibitors and diuretics. The current treatment provides moderate improvement. There are no compliance problems.  There is no history of angina, kidney disease, CAD/MI, CVA, heart failure, left ventricular hypertrophy, PVD or retinopathy. There is no history of chronic renal disease, a hypertension causing med or renovascular disease.    Lab Results  Component Value Date   NA 144 12/22/2021   K 4.2 12/22/2021   CO2 23 12/22/2021   GLUCOSE 98 12/22/2021   BUN 15 12/22/2021   CREATININE 0.69 12/22/2021   CALCIUM 10.1 12/22/2021   EGFR 97 12/22/2021   GFRNONAA 88 11/19/2018   Lab Results  Component Value Date   CHOL 201 (H) 12/22/2021   HDL 85 12/22/2021   LDLCALC 99 12/22/2021   TRIG 97 12/22/2021   Lab Results  Component Value Date   TSH 2.810 11/19/2018   No results found for: "HGBA1C" Lab Results  Component Value Date   WBC 7.1 03/13/2022   HGB 14.3 03/13/2022   HCT 39.4 03/13/2022   MCV 93 03/13/2022   PLT 231 03/13/2022   Lab Results  Component Value Date   ALT 16 03/10/2021   AST 18 03/10/2021   ALKPHOS 58 03/10/2021   BILITOT 1.3 (H) 03/10/2021   No results found for: "25OHVITD2", "25OHVITD3", "VD25OH"   Review of Systems  Constitutional:  Negative for chills, fever and malaise/fatigue.  HENT:  Negative for drooling, ear discharge, ear pain and sore throat.    Eyes:  Negative for blurred vision.  Respiratory:  Negative for cough, shortness of breath and wheezing.   Cardiovascular:  Negative for chest pain, palpitations, orthopnea, leg swelling and PND.  Gastrointestinal:  Negative for abdominal pain, blood in stool, constipation, diarrhea and nausea.  Endocrine: Negative for polydipsia.  Genitourinary:  Negative for dysuria, frequency, hematuria and urgency.  Musculoskeletal:  Negative for back pain, myalgias and neck pain.  Skin:  Negative for rash.  Allergic/Immunologic: Negative for environmental allergies.  Neurological:  Negative for dizziness and headaches.  Hematological:  Does not bruise/bleed easily.  Psychiatric/Behavioral:  Negative for suicidal ideas. The patient is not nervous/anxious.     Patient Active Problem List   Diagnosis Date Noted   Cancer of overlapping sites of right breast (HCC) 05/21/2022   H/O total hysterectomy 12/22/2021   Lumbar spondylosis 09/20/2021   Right hip pain 09/20/2021   Skin nodule 06/30/2013    Allergies  Allergen Reactions   Latex Rash    Past Surgical History:  Procedure Laterality Date   ABDOMINAL HYSTERECTOMY     AUGMENTATION MAMMAPLASTY     BREAST BIOPSY Right    neg   BREAST BIOPSY Right 05/16/2022   x 2 areas/ [place] bx & Stereo bx coil clip/path pending   BREAST BIOPSY Left 05/16/2022   [place] bx/ path pending   CESAREAN SECTION     COLONOSCOPY WITH PROPOFOL N/A 11/24/2018   Procedure: COLONOSCOPY WITH PROPOFOL;  Surgeon: Tobi Bastos,  Bailey Mech, MD;  Location: [facility];  Service: Gastroenterology;  Laterality: N/A;   COLONOSCOPY WITH PROPOFOL N/A 07/03/2021   Procedure: COLONOSCOPY WITH PROPOFOL;  Surgeon: Jonathon Bellows, MD;  Location: [facility] ENDOSCOPY;  Service: Gastroenterology;  Laterality: N/A;   TOTAL ABDOMINAL HYSTERECTOMY      Social History   Tobacco Use   Smoking status: Never    Passive exposure: Never   Smokeless tobacco: Never  Vaping Use   Vaping Use: Never used   Substance Use Topics   Alcohol use: Yes   Drug use: Never     Medication list has been reviewed and updated.  Current Meds  Medication Sig   azelastine (ASTELIN) 0.1 % nasal spray Place 1 spray into both nostrils daily. Use in each nostril as directed   fluticasone (FLONASE) 50 MCG/ACT nasal spray Place 1 spray into both nostrils daily as needed for allergies or rhinitis.   lidocaine-prilocaine (EMLA) cream Apply to the areola of both breasts and cover with plastic wrap one hour prior to leaving for surgery.   lisinopril-hydrochlorothiazide (ZESTORETIC) 10-12.5 MG tablet Take 1 tablet by mouth daily.   omeprazole (PRILOSEC) 20 MG capsule Take 20 mg by mouth daily.   triamcinolone cream (KENALOG) 0.1 % 1 Application as needed.       06/01/2022    9:04 AM 03/13/2022    8:50 AM 12/22/2021    8:15 AM 10/27/2021    2:04 PM  GAD 7 : Generalized Anxiety Score  Nervous, Anxious, on Edge 0 0 0 0  Control/stop worrying 0 0 0 0  Worry too much - different things 0 0 0 0  Trouble relaxing 0 0 0 0  Restless 0 0 0 0  Easily annoyed or irritable 0 0 0 0  Afraid - awful might happen 0 0 0 0  Total GAD 7 Score 0 0 0 0  Anxiety Difficulty Not difficult at all Not difficult at all Not difficult at all Not difficult at all       06/01/2022    9:04 AM 03/13/2022    8:50 AM 12/22/2021    8:14 AM  Depression screen PHQ 2/9  Decreased Interest 0 0 0  Down, Depressed, Hopeless 0 0 0  PHQ - 2 Score 0 0 0  Altered sleeping 0 0 0  Tired, decreased energy 0 0 0  Change in appetite 0 0 0  Feeling bad or failure about yourself  0 0 0  Trouble concentrating 0 0 0  Moving slowly or fidgety/restless 0 0 0  Suicidal thoughts 0 0 0  PHQ-9 Score 0 0 0  Difficult doing work/chores Not difficult at all Not difficult at all Not difficult at all    BP Readings from Last 3 Encounters:  06/01/22 110/82  05/30/22 (!) 154/88  05/22/22 (!) 160/97    Physical Exam Vitals and nursing note reviewed. Exam  conducted with a chaperone present.  Constitutional:      General: She is not in acute distress.    Appearance: She is not diaphoretic.  HENT:     Head: Normocephalic and atraumatic.     Right Ear: External ear normal.     Left Ear: External ear normal.     Nose: Nose normal.     Mouth/Throat:     Mouth: Mucous membranes are moist.  Eyes:     General:        Right eye: No discharge.        Left eye: No discharge.  Conjunctiva/sclera: Conjunctivae normal.     Pupils: Pupils are equal, round, and reactive to light.  Neck:     Thyroid: No thyromegaly.     Vascular: No JVD.  Cardiovascular:     Rate and Rhythm: Normal rate and regular rhythm.     Heart sounds: Normal heart sounds. No murmur heard.    No friction rub. No gallop.  Pulmonary:     Effort: Pulmonary effort is normal.     Breath sounds: Normal breath sounds. No wheezing, rhonchi or rales.  Abdominal:     General: Bowel sounds are normal.     Palpations: Abdomen is soft. There is no mass.     Tenderness: There is no abdominal tenderness. There is no guarding.  Musculoskeletal:     Cervical back: Neck supple.  Lymphadenopathy:     Cervical: No cervical adenopathy.  Skin:    General: Skin is warm.  Neurological:     Mental Status: She is alert.     Deep Tendon Reflexes: Reflexes are normal and symmetric.     Wt Readings from Last 3 Encounters:  06/01/22 142 lb (64.4 kg)  05/30/22 142 lb (64.4 kg)  05/22/22 145 lb 11.2 oz (66.1 kg)    BP 110/82   Pulse 64   Ht $R'5\' 7"'su$  (1.702 m)   Wt 142 lb (64.4 kg)   SpO2 95%   BMI 22.24 kg/m   Assessment and Plan:  Day  1. Primary hypertension Chronic.  Controlled.  Stable.  Blood pressure.  Tolerating medication very well.  Lisinopril 10-12.5 mg.  Will check CMP for electrolytes and GFR. - lisinopril-hydrochlorothiazide (ZESTORETIC) 10-12.5 MG tablet; Take 1 tablet by mouth daily.  Dispense: 90 tablet; Refill: 1 - Comprehensive Metabolic Panel (CMET)    Otilio Miu, MD

## 2022-06-12 ENCOUNTER — Ambulatory Visit (INDEPENDENT_AMBULATORY_CARE_PROVIDER_SITE_OTHER): Payer: BC Managed Care – PPO | Admitting: Plastic Surgery

## 2022-06-12 ENCOUNTER — Encounter: Payer: Self-pay | Admitting: Plastic Surgery

## 2022-06-12 VITALS — BP 154/95 | HR 73 | Ht 67.0 in | Wt 142.8 lb

## 2022-06-12 DIAGNOSIS — C50811 Malignant neoplasm of overlapping sites of right female breast: Secondary | ICD-10-CM

## 2022-06-12 DIAGNOSIS — C50919 Malignant neoplasm of unspecified site of unspecified female breast: Secondary | ICD-10-CM | POA: Insufficient documentation

## 2022-06-12 DIAGNOSIS — C50911 Malignant neoplasm of unspecified site of right female breast: Secondary | ICD-10-CM

## 2022-06-12 NOTE — Progress Notes (Addendum)
Patient ID: Marie Nguyen, female    DOB: 1956-07-06, 66 y.o.   MRN: [id number]   Chief Complaint  Patient presents with   Consult    The patient is a 66 year old female here for evaluation for breast reconstruction.  She is seeing Dr. Hampton Abbot.  She noticed a lump in the right breast and went for a work-up.  The biopsy showed estrogen and progesterone positive multifocal right breast cancer and left breast cancer.  She is 5 feet 7 inches tall and weighs 142 pounds.  Her preoperative bra size is a D cup.  She had 32 years ago in [place].  She thinks that they are gel implants and under the muscle.  She is not sure she is on undergoing bilateral mastectomies.  She is not sure if she will need radiation or not until after surgery.  Her past surgical history includes hysterectomy, augmentation mammoplasty, and C-section.  The patient would like to be around the same size but understands she could be smaller.  Mastectomies are planned without preservation of the nipple areolar complex.  The right breast is significantly larger than the left.  I am not clear whether or not this is seroma or hematoma.     Review of Systems  Constitutional: Negative.   HENT: Negative.    Eyes: Negative.   Respiratory: Negative.  Negative for chest tightness and shortness of breath.   Cardiovascular: Negative.   Gastrointestinal: Negative.   Endocrine: Negative.   Genitourinary: Negative.   Musculoskeletal: Negative.     Past Medical History:  Diagnosis Date   Allergy    Eczema    GERD (gastroesophageal reflux disease)    Heart murmur     Past Surgical History:  Procedure Laterality Date   ABDOMINAL HYSTERECTOMY     AUGMENTATION MAMMAPLASTY     BREAST BIOPSY Right    neg   BREAST BIOPSY Right 05/16/2022   x 2 areas/ [place] bx & Stereo bx coil clip/path pending   BREAST BIOPSY Left 05/16/2022   [place] bx/ path pending   CESAREAN SECTION     COLONOSCOPY WITH PROPOFOL N/A 11/24/2018   Procedure:  COLONOSCOPY WITH PROPOFOL;  Surgeon: Jonathon Bellows, MD;  Location: [facility] ENDOSCOPY;  Service: Gastroenterology;  Laterality: N/A;   COLONOSCOPY WITH PROPOFOL N/A 07/03/2021   Procedure: COLONOSCOPY WITH PROPOFOL;  Surgeon: Jonathon Bellows, MD;  Location: [facility] ENDOSCOPY;  Service: Gastroenterology;  Laterality: N/A;   TOTAL ABDOMINAL HYSTERECTOMY        Current Outpatient Medications:    azelastine (ASTELIN) 0.1 % nasal spray, Place 1 spray into both nostrils daily. Use in each nostril as directed, Disp: , Rfl:    fluticasone (FLONASE) 50 MCG/ACT nasal spray, Place 1 spray into both nostrils daily as needed for allergies or rhinitis., Disp: , Rfl:    lidocaine-prilocaine (EMLA) cream, Apply to the areola of both breasts and cover with plastic wrap one hour prior to leaving for surgery., Disp: 5 g, Rfl: 0   lisinopril-hydrochlorothiazide (ZESTORETIC) 10-12.5 MG tablet, Take 1 tablet by mouth daily., Disp: 90 tablet, Rfl: 1   omeprazole (PRILOSEC) 20 MG capsule, Take 20 mg by mouth daily., Disp: , Rfl:    triamcinolone cream (KENALOG) 0.1 %, 1 Application as needed., Disp: , Rfl:    Objective:   Vitals:   06/12/22 1110  BP: (!) 154/95  Pulse: 73  SpO2: 99%    Physical Exam Vitals and nursing note reviewed.  Constitutional:  Appearance: Normal appearance.  HENT:     Head: Normocephalic and atraumatic.  Cardiovascular:     Rate and Rhythm: Normal rate.     Pulses: Normal pulses.  Pulmonary:     Effort: Pulmonary effort is normal. No respiratory distress.  Abdominal:     General: There is no distension.     Palpations: Abdomen is soft.  Musculoskeletal:        General: Swelling and deformity present.  Skin:    General: Skin is warm.     Capillary Refill: Capillary refill takes less than 2 seconds.     Coloration: Skin is not jaundiced.     Findings: No bruising.  Neurological:     Mental Status: She is alert and oriented to person, place, and time.  Psychiatric:        Mood and  Affect: Mood normal.        Behavior: Behavior normal.        Thought Content: Thought content normal.        Judgment: Judgment normal.     Assessment & Plan:  Cancer of overlapping sites of right breast ([place])  Malignant neoplasm of right female breast, unspecified estrogen receptor status, unspecified site of breast [facility])  The options for reconstruction we explained to the patient / family for breast reconstruction.  There are two general categories of reconstruction.  We can reconstruction a breast with implants or use the patient's own tissue.  These were further discussed as listed.  Breast reconstruction is an optional procedure and eligibility depends on the full spectrum of the health of the patient and any co-morbidities.  More than one surgery is often needed to complete the reconstruction process.  The process can take three to twelve months to complete.  The breasts will not be identical due to many factors such as rib differences, shoulder asymmetry and treatments such as radiation.  The goal is to get the breasts to look normal and symmetrical in clothes.  Scars are a part of surgery and may fade some in time but will always be present under clothes.  Surgery may be an option on the non-cancer breast to achieve more symmetry.  No matter which procedure is chosen there is always the risk of complications and even failure of the body to heal.  This could result in no breast.    The options for reconstruction include:  1. Placement of a tissue expander with Acellular dermal matrix. When the expander is the desired size surgery is performed to remove the expander and place an implant.  In some cases the implant can be placed without an expander.  2. Autologous reconstruction can include using a muscle or tissue from another area of the body to create a breast.  3. Combined procedures (ie. latissismus dorsi flap) can be done with an expander / implant placed under the muscle.   The  risks, benefits, scars and recovery time were discussed for each of the above. Risks include bleeding, infection, hematoma, seroma, scarring, pain, wound healing complications, flap loss, fat necrosis, capsular contracture, need for implant removal, donor site complications, bulge, hernia, umbilical necrosis, need for urgent reoperation, and need for dressing changes.   The procedure the patient selected / that was best for the patient, was then discussed in further detail.  Total time: 45 minutes. This includes time spent with the patient during the visit as well as time spent before and after the visit reviewing the chart, documenting the encounter, making  phone calls and reviewing studies.   With the need for two breasts to be reconstructed the patient does not have enough tissue of her abdomen.  She also is adamant she would like to have implant reconstruction.  She will likely need expanders but if the implants are truly under the muscle then it is possible to go straight to implant reconstruction.  I will plan on being ready for it either.  We will not plan on salvaging the nipple areola complex.  She would have a lot of ptosis if we did try and salvage them.  Pictures were obtained of the patient and placed in the chart with the patient's or guardian's permission.   [place], DO

## 2022-06-13 ENCOUNTER — Inpatient Hospital Stay: Payer: BC Managed Care – PPO | Attending: Oncology | Admitting: *Deleted

## 2022-06-13 DIAGNOSIS — C50811 Malignant neoplasm of overlapping sites of right female breast: Secondary | ICD-10-CM

## 2022-06-13 NOTE — Progress Notes (Signed)
Multidisciplinary Oncology Council Documentation  Marie Nguyen was presented by our [facility] on 06/13/2022, which included representatives from:  Palliative Care Dietitian  Physical/Occupational Therapist Nurse Navigator Genetics Speech Therapist Social work Survivorship RN Financial Navigator Research RN   Marie Nguyen currently presents with history of breast cancer  We reviewed previous medical and familial history, history of present illness, and recent lab results along with all available histopathologic and imaging studies. The [place] considered available treatment options and made the following recommendations/referrals:  - social work, Soil scientist, rehab  The [facility] is a meeting of clinicians from various specialty areas who evaluate and discuss patients for whom a multidisciplinary approach is being considered. Final determinations in the plan of care are those of the provider(s).   Today's extended care, comprehensive team conference, Marie Nguyen was not present for the discussion and was not examined.

## 2022-06-14 ENCOUNTER — Encounter: Payer: Self-pay | Admitting: *Deleted

## 2022-06-14 ENCOUNTER — Other Ambulatory Visit: Payer: Self-pay | Admitting: Surgery

## 2022-06-14 ENCOUNTER — Telehealth: Payer: Self-pay | Admitting: Surgery

## 2022-06-14 ENCOUNTER — Telehealth: Payer: Self-pay | Admitting: *Deleted

## 2022-06-14 ENCOUNTER — Other Ambulatory Visit: Payer: Self-pay

## 2022-06-14 DIAGNOSIS — Z170 Estrogen receptor positive status [ER+]: Secondary | ICD-10-CM

## 2022-06-14 DIAGNOSIS — C50811 Malignant neoplasm of overlapping sites of right female breast: Secondary | ICD-10-CM

## 2022-06-14 NOTE — Telephone Encounter (Signed)
Ins Auth information for joint surgery case on 11/2 with Dr. Hampton Abbot / Dr. Marla Roe  How Josem Kaufmann / requirements were verified: Phone  Ins Plan: [employer] [place] [employer] Rep: [id number]  Is auth required? No Reference: [id number]

## 2022-06-14 NOTE — Telephone Encounter (Signed)
Patient has been advised of Pre-Admission date/time, and Surgery date at [facility].  Surgery Date: 07/26/22, patient to arrive @ 7:30 am at [facility] will be having SLN bx injection first prior to her surgery with Dr.Piscoya.    Preadmission Testing Date: 07/16/22 (phone 8a-1p)  Patient is informed that this will be a combined case both with Dr. Hampton Abbot and Dr. Marla Roe. All questions are answered, patient wished well and to call should she have any questions or concerns.

## 2022-06-15 ENCOUNTER — Inpatient Hospital Stay: Payer: BC Managed Care – PPO | Admitting: Licensed Clinical Social Worker

## 2022-06-15 ENCOUNTER — Encounter: Payer: Self-pay | Admitting: *Deleted

## 2022-06-15 DIAGNOSIS — C50011 Malignant neoplasm of nipple and areola, right female breast: Secondary | ICD-10-CM

## 2022-06-15 NOTE — Progress Notes (Signed)
Ms. Veno surgery has been scheduled for 07/26/22.  Follow up with Dr. Grayland Ormond has been scheduled for 08/14/22.  Appt. Details given to her.

## 2022-06-15 NOTE — Progress Notes (Signed)
[place] Clinical Social Work  Initial Assessment   Marie Nguyen is a 66 y.o. year old female contacted by phone. Clinical Social Work was referred by medical provider for assessment of psychosocial needs.   SDOH (Social Determinants of Health) assessments performed: Yes SDOH Interventions    Flowsheet Row Clinical Support from 06/15/2022 in [place] at [place] Interventions   Food Insecurity Interventions Intervention Not Indicated  Housing Interventions Intervention Not Indicated  Transportation Interventions Intervention Not Indicated, Patient Resources (Friends/Family)  Utilities Interventions Intervention Not Indicated  Alcohol Usage Interventions Intervention Not Indicated (Score <7)  Financial Strain Interventions Intervention Not Indicated  Physical Activity Interventions Intervention Not Indicated  Stress Interventions Intervention Not Indicated  Social Connections Interventions Intervention Not Indicated       SDOH Screenings   Food Insecurity: No Food Insecurity (06/15/2022)  Housing: Low Risk  (06/15/2022)  Transportation Needs: No Transportation Needs (06/15/2022)  Utilities: Not At Risk (06/15/2022)  Alcohol Screen: Low Risk  (06/15/2022)  Depression (PHQ2-9): Low Risk  (06/01/2022)  Financial Resource Strain: Low Risk  (06/15/2022)  Physical Activity: Sufficiently Active (06/15/2022)  Social Connections: Moderately Integrated (06/15/2022)  Stress: No Stress Concern Present (06/15/2022)  Tobacco Use: Low Risk  (06/12/2022)     Distress Screen completed: No    05/22/2022    2:14 PM  ONCBCN DISTRESS SCREENING  Screening Type Initial Screening  Distress experienced in past week (1-10) 0      Family/Social Information:  Housing Arrangement: patient lives alone, main caregiver/contact Purcell,Terri [phone] Family members/support persons in your life? Family, Friends, Medical laboratory scientific officer, and Geophysical data processor concerns: no   Employment: Working full time , plans to continue working from home during treatment, employer [employer], [place] with [employer] and short-term disability Income source: Education officer, community concerns: No Type of concern: None Food access concerns: no Religious or spiritual practice: Not known Services Currently in place:  Short-disability and FMLA, BCBS PPO  Coping/ Adjustment to diagnosis: Patient understands treatment plan and what happens next? yes Concerns about diagnosis and/or treatment: I'm not especially worried about anything Patient reported stressors:  Patient denied stressors Hopes and/or priorities: N/A Patient enjoys  N/A Current coping skills/ strengths: Ability for insight , Active sense of humor , Average or above average intelligence , Capable of independent living , Communication skills , Scientist, research (life sciences) , General fund of knowledge , Motivation for treatment/growth , Physical Health , Supportive family/friends , and Work skills     SUMMARY: Current SDOH Barriers:  No SDOH barrier identified  Clinical Social Work Clinical Goal(s):  No clinical social work goals at this time  Interventions: Discussed common feeling and emotions when being diagnosed with cancer, and the importance of support during treatment Informed patient of the support team roles and support services at [facility] Provided [facility] contact information and encouraged patient to call with any questions or concerns Referred patient to Designer, jewellery and Provided patient with information about CSW role in patient care, available resources,  information on calendar events and groups.  CSW will send information to [email or web address]   Follow Up Plan: Patient will contact CSW with any support or resource needs Patient verbalizes understanding of plan: Yes    Milfred Krammes, LCSW

## 2022-07-04 ENCOUNTER — Telehealth: Payer: Self-pay | Admitting: *Deleted

## 2022-07-04 NOTE — Telephone Encounter (Signed)
Left message to let patient know that her FMLA is ready to be picked up

## 2022-07-05 ENCOUNTER — Encounter: Payer: Self-pay | Admitting: *Deleted

## 2022-07-12 ENCOUNTER — Encounter: Payer: Self-pay | Admitting: Surgical

## 2022-07-12 NOTE — H&P (View-Only) (Signed)
Patient ID: Marie Nguyen, female    DOB: [birth year], 66 y.o.   MRN: [id number]  Chief Complaint  Patient presents with   Pre-op Exam      ICD-10-CM   1. Cancer of overlapping sites of right breast ([place])  C50.811     2. Malignant neoplasm of right female breast, unspecified estrogen receptor status, unspecified site of breast ([place])  C50.911       History of Present Illness: Marie Nguyen is a very pleasant 66 y.o.  female  with a history of breast cancer.  She presents for preoperative evaluation for upcoming procedure, Immediate bilateral breast reconstruction with placement of tissue expander and alloderm, scheduled for 07/26/22 with Dr.  Marla Roe. She presents today with her sister.  The patient has not had problems with anesthesia. No history of DVT/PE.  No family history of DVT/PE.  No family or personal history of bleeding or clotting disorders.  Patient is not currently taking any blood thinners.  No history of CVA/MI.   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  Her preoperative bra size is a D.  Patient with current implants in place, placed 32 years ago in [place]?  She thinks they are gel implants and under the muscle.  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  She has significant asymmetry, wears a pad on left side for improved symmetry.  Feels as if the right side is a D cup and the left is a C cup.  She would like to be a B cup postoperatively.  She reports that she would prefer an implant to be immediately placed if possible.  She is hopeful to be about a B cup.  She reports she discussed this with Dr. Marla Roe as well.  She is aware that the nipple areolar complex will not be preserved.  She believes her current implants are approximately 350  cc.  She was very adamant at today's appointment that she would prefer to have the implants placed if able.  She only would like to have expanders placed if absolutely necessary.  She reports she is feeling well, no recent changes to her health.   Past Medical History: Allergies: Allergies  Allergen Reactions   Latex Rash    Current Medications:  Current Outpatient Medications:    azelastine (ASTELIN) 0.1 % nasal spray, Place 1 spray into both nostrils daily. Use in each nostril as directed, Disp: , Rfl:    cephALEXin (KEFLEX) 500 MG capsule, Take 1 capsule (500 mg total) by mouth 4 (four) times daily for 5 days., Disp: 20 capsule, Rfl: 0   diazepam (VALIUM) 2 MG tablet, Take 1 tablet (2 mg total) by mouth every 12 (twelve) hours as needed for muscle spasms., Disp: 20 tablet, Rfl: 0   fluticasone (FLONASE) 50 MCG/ACT nasal spray, Place 1 spray into both nostrils daily as needed for allergies or rhinitis., Disp: , Rfl:    lidocaine-prilocaine (EMLA) cream, Apply to the areola of both breasts and cover with plastic wrap one hour prior to leaving for surgery., Disp: 5 g, Rfl: 0   lisinopril-hydrochlorothiazide (ZESTORETIC) 10-12.5 MG tablet, Take 1 tablet by mouth daily., Disp: 90 tablet, Rfl: 1   omeprazole (PRILOSEC) 20 MG capsule, Take 20 mg by mouth daily., Disp: , Rfl:    ondansetron (ZOFRAN)  4 MG tablet, Take 1 tablet (4 mg total) by mouth every 8 (eight) hours as needed for nausea or vomiting., Disp: 20 tablet, Rfl: 0   oxyCODONE (OXY IR/ROXICODONE) 5 MG immediate release tablet, Take 1 tablet (5 mg total) by mouth every 6 (six) hours as needed for up to 5 days for severe pain., Disp: 20 tablet, Rfl: 0   triamcinolone cream (KENALOG) 0.1 %, 1 Application as needed., Disp: , Rfl:   Past Medical Problems: Past Medical History:  Diagnosis Date   Allergy    Eczema    GERD (gastroesophageal reflux disease)    Heart murmur     Past Surgical History: Past Surgical History:   Procedure Laterality Date   ABDOMINAL HYSTERECTOMY     AUGMENTATION MAMMAPLASTY     BREAST BIOPSY Right    neg   BREAST BIOPSY Right 05/16/2022   x 2 areas/ [place] bx & Stereo bx coil clip/path pending   BREAST BIOPSY Left 05/16/2022   [place] bx/ path pending   CESAREAN SECTION     COLONOSCOPY WITH PROPOFOL N/A 11/24/2018   Procedure: COLONOSCOPY WITH PROPOFOL;  Surgeon: Jonathon Bellows, MD;  Location: [facility] ENDOSCOPY;  Service: Gastroenterology;  Laterality: N/A;   COLONOSCOPY WITH PROPOFOL N/A 07/03/2021   Procedure: COLONOSCOPY WITH PROPOFOL;  Surgeon: Jonathon Bellows, MD;  Location: [facility] ENDOSCOPY;  Service: Gastroenterology;  Laterality: N/A;   TOTAL ABDOMINAL HYSTERECTOMY      Social History: Social History   Socioeconomic History   Marital status: Divorced    Spouse name: Not on file   Number of children: 2   Years of education: Not on file   Highest education level: Not on file  Occupational History   Not on file  Tobacco Use   Smoking status: Never    Passive exposure: Never   Smokeless tobacco: Never  Vaping Use   Vaping Use: Never used  Substance and Sexual Activity   Alcohol use: Yes   Drug use: Never   Sexual activity: Yes    Partners: Male  Other Topics Concern   Not on file  Social History Narrative   Not on file   Social Determinants of Health   Financial Resource Strain: Low Risk  (06/15/2022)   Overall Financial Resource Strain (CARDIA)    Difficulty of Paying Living Expenses: Not very hard  Food Insecurity: No Food Insecurity (06/15/2022)   Hunger Vital Sign    Worried About Running Out of Food in the Last Year: Never true    Ran Out of Food in the Last Year: Never true  Transportation Needs: No Transportation Needs (06/15/2022)   PRAPARE - Hydrologist (Medical): No    Lack of Transportation (Non-Medical): No  Physical Activity: Sufficiently Active (06/15/2022)   Exercise Vital Sign    Days of Exercise per Week: 5 days     Minutes of Exercise per Session: 30 min  Stress: No Stress Concern Present (06/15/2022)   [place]    Feeling of Stress : Only a little  Social Connections: Moderately Integrated (06/15/2022)   Social Connection and Isolation Panel [NHANES]    Frequency of Communication with Friends and Family: More than three times a week    Frequency of Social Gatherings with Friends and Family: More than three times a week    Attends Religious Services: 1 to 4 times per year    Active Member of [employer] or Organizations: Yes  Attends Archivist Meetings: 1 to 4 times per year    Marital Status: Divorced  Intimate Partner Violence: Not At Risk (06/15/2022)   Humiliation, Afraid, Rape, and Kick questionnaire    Fear of Current or Ex-Partner: No    Emotionally Abused: No    Physically Abused: No    Sexually Abused: No    Family History: Family History  Problem Relation Age of Onset   Breast cancer Mother    Cancer Mother    Hypertension Mother    Diabetes Father    Hypertension Father    Stroke Father    Hypertension Sister    Cancer Maternal Grandfather    Colon cancer Maternal Grandfather     Review of Systems: Review of Systems  Constitutional: Negative.   Respiratory: Negative.    Cardiovascular: Negative.   Gastrointestinal: Negative.   Musculoskeletal: Negative.   Neurological: Negative.     Physical Exam: Vital Signs BP 139/88 (BP Location: Left Arm, Patient Position: Sitting, Cuff Size: Normal)   Pulse 88   Ht '5\' 7"'$  (1.702 m)   Wt 142 lb 1.6 oz (64.5 kg)   SpO2 96%   BMI 22.26 kg/m   Physical Exam  Constitutional:      General: Not in acute distress.    Appearance: Normal appearance. Not ill-appearing.  HENT:     Head: Normocephalic and atraumatic.  Eyes:     Pupils: Pupils are equal, round Neck:     Musculoskeletal: Normal range of motion.  Cardiovascular:     Rate and Rhythm: Normal  rate    Pulses: Normal pulses.  Pulmonary:     Effort: Pulmonary effort is normal. No respiratory distress.  Musculoskeletal: Normal range of motion.  Skin:    General: Skin is warm and dry.     Findings: No erythema or rash.  Neurological:     General: No focal deficit present.     Mental Status: Alert and oriented to person, place, and time. Mental status is at baseline.     Motor: No weakness.  Psychiatric:        Mood and Affect: Mood normal.        Behavior: Behavior normal.    Assessment/Plan: The patient is scheduled for bilateral breast reconstruction placement of tissue expanders, possible placement of implants and Flex HD with Dr. Marla Roe.  Risks, benefits, and alternatives of procedure discussed, questions answered and consent obtained.    Smoking Status: Non-smoker; Counseling Given?  N/A  Caprini Score: 6; Risk Factors include: age, right breast cancer, and length of planned surgery. Recommendation for mechanical prophylaxis. Encourage early ambulation.   Pictures obtained: '@consult'$   Post-op Rx sent to pharmacy: Oxycodone, Zofran, Keflex, Valium  Patient was provided with the breast reconstruction and General Surgical Risk consent document and Pain Medication Agreement prior to their appointment.  They had adequate time to read through the risk consent documents and Pain Medication Agreement. We also discussed them in person together during this preop appointment. All of their questions were answered to their satisfaction.  Recommended calling if they have any further questions.  Risk consent form and Pain Medication Agreement to be scanned into patient's chart.  The risks that can be encountered with and after placement of a breast expander placement were discussed and include the following but not limited to these: bleeding, infection, delayed healing, anesthesia risks, skin sensation changes, injury to structures including nerves, blood vessels, and muscles which may  be temporary or permanent, allergies to  tape, suture materials and glues, blood products, topical preparations or injected agents, skin contour irregularities, skin discoloration and swelling, deep vein thrombosis, cardiac and pulmonary complications, pain, which may persist, fluid accumulation, wrinkling of the skin over the expander, changes in nipple or breast sensation, expander leakage or rupture, faulty position of the expander, persistent pain, formation of tight scar tissue around the expander (capsular contracture), possible need for revisional surgery or staged procedures.  The risks that can be encountered with and after placement of a breast implant were discussed and include the following but not limited to these: bleeding, infection, delayed healing, anesthesia risks, skin sensation changes, injury to structures including nerves, blood vessels, and muscles which may be temporary or permanent, allergies to tape, suture materials and glues, blood products, topical preparations or injected agents, skin contour irregularities, skin discoloration and swelling, deep vein thrombosis, cardiac and pulmonary complications, pain, which may persist, fluid accumulation, wrinkling of the skin over the implanmt, changes in nipple or breast sensation, implant leakage or rupture, faulty position of the implant, persistent pain, formation of tight scar tissue around the implant (capsular contracture).   Electronically signed by: Carola Rhine Unity Luepke, PA-C 07/13/2022 11:33 AM

## 2022-07-12 NOTE — Progress Notes (Signed)
Patient ID: Marie Nguyen, female    DOB: [birth year], 66 y.o.   MRN: [id number]  Chief Complaint  Patient presents with  . Pre-op Exam      ICD-10-CM   1. Cancer of overlapping sites of right breast ([facility])  C50.811     2. Malignant neoplasm of right female breast, unspecified estrogen receptor status, unspecified site of breast ([facility])  C50.911       History of Present Illness: Marie Nguyen is a very pleasant 66 y.o.  female  with a history of breast cancer.  She presents for preoperative evaluation for upcoming procedure, Immediate bilateral breast reconstruction with placement of tissue expander and alloderm, scheduled for 07/26/22 with Dr.  Ulice Bold. She presents today with her sister.  The patient has not had problems with anesthesia. No history of DVT/PE.  No family history of DVT/PE.  No family or personal history of bleeding or clotting disorders.  Patient is not currently taking any blood thinners.  No history of CVA/MI.   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  Her preoperative bra size is a D.  Patient with current implants in place, placed 32 years ago in [place]?  She thinks they are gel implants and under the muscle.  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  She has significant asymmetry, wears a pad on left side for improved symmetry.  Feels as if the right side is a D cup and the left is a C cup.  She would like to be a B cup postoperatively.  She reports that she would prefer an implant to be immediately placed if possible.  She is hopeful to be about a B cup.  She reports she discussed this with Dr. Ulice Bold as well.  She is aware that the nipple areolar complex will not be preserved.  She believes her current implants are approximately 350  cc.  She was very adamant at today's appointment that she would prefer to have the implants placed if able.  She only would like to have expanders placed if absolutely necessary.  She reports she is feeling well, no recent changes to her health.   Past Medical History: Allergies: Allergies  Allergen Reactions  . Latex Rash    Current Medications:  Current Outpatient Medications:  .  azelastine (ASTELIN) 0.1 % nasal spray, Place 1 spray into both nostrils daily. Use in each nostril as directed, Disp: , Rfl:  .  cephALEXin (KEFLEX) 500 MG capsule, Take 1 capsule (500 mg total) by mouth 4 (four) times daily for 5 days., Disp: 20 capsule, Rfl: 0 .  diazepam (VALIUM) 2 MG tablet, Take 1 tablet (2 mg total) by mouth every 12 (twelve) hours as needed for muscle spasms., Disp: 20 tablet, Rfl: 0 .  fluticasone (FLONASE) 50 MCG/ACT nasal spray, Place 1 spray into both nostrils daily as needed for allergies or rhinitis., Disp: , Rfl:  .  lidocaine-prilocaine (EMLA) cream, Apply to the areola of both breasts and cover with plastic wrap one hour prior to leaving for surgery., Disp: 5 g, Rfl: 0 .  lisinopril-hydrochlorothiazide (ZESTORETIC) 10-12.5 MG tablet, Take 1 tablet by mouth daily., Disp: 90 tablet, Rfl: 1 .  omeprazole (PRILOSEC) 20 MG capsule, Take 20 mg by mouth daily., Disp: , Rfl:  .  ondansetron (ZOFRAN)  4 MG tablet, Take 1 tablet (4 mg total) by mouth every 8 (eight) hours as needed for nausea or vomiting., Disp: 20 tablet, Rfl: 0 .  oxyCODONE (OXY IR/ROXICODONE) 5 MG immediate release tablet, Take 1 tablet (5 mg total) by mouth every 6 (six) hours as needed for up to 5 days for severe pain., Disp: 20 tablet, Rfl: 0 .  triamcinolone cream (KENALOG) 0.1 %, 1 Application as needed., Disp: , Rfl:   Past Medical Problems: Past Medical History:  Diagnosis Date  . Allergy   . Eczema   . GERD (gastroesophageal reflux disease)   . Heart murmur     Past Surgical History: Past Surgical  History:  Procedure Laterality Date  . ABDOMINAL HYSTERECTOMY    . AUGMENTATION MAMMAPLASTY    . BREAST BIOPSY Right    neg  . BREAST BIOPSY Right 05/16/2022   x 2 areas/ [place] bx & Stereo bx coil clip/path pending  . BREAST BIOPSY Left 05/16/2022   [place] bx/ path pending  . CESAREAN SECTION    . COLONOSCOPY WITH PROPOFOL N/A 11/24/2018   Procedure: COLONOSCOPY WITH PROPOFOL;  Surgeon: Wyline Mood, MD;  Location: [facility] ENDOSCOPY;  Service: Gastroenterology;  Laterality: N/A;  . COLONOSCOPY WITH PROPOFOL N/A 07/03/2021   Procedure: COLONOSCOPY WITH PROPOFOL;  Surgeon: Wyline Mood, MD;  Location: [facility] ENDOSCOPY;  Service: Gastroenterology;  Laterality: N/A;  . TOTAL ABDOMINAL HYSTERECTOMY      Social History: Social History   Socioeconomic History  . Marital status: Divorced    Spouse name: Not on file  . Number of children: 2  . Years of education: Not on file  . Highest education level: Not on file  Occupational History  . Not on file  Tobacco Use  . Smoking status: Never    Passive exposure: Never  . Smokeless tobacco: Never  Vaping Use  . Vaping Use: Never used  Substance and Sexual Activity  . Alcohol use: Yes  . Drug use: Never  . Sexual activity: Yes    Partners: Male  Other Topics Concern  . Not on file  Social History Narrative  . Not on file   Social Determinants of Health   Financial Resource Strain: Low Risk  (06/15/2022)   Overall Financial Resource Strain (CARDIA)   . Difficulty of Paying Living Expenses: Not very hard  Food Insecurity: No Food Insecurity (06/15/2022)   Hunger Vital Sign   . Worried About Programme researcher, broadcasting/film/video in the Last Year: Never true   . Ran Out of Food in the Last Year: Never true  Transportation Needs: No Transportation Needs (06/15/2022)   PRAPARE - Transportation   . Lack of Transportation (Medical): No   . Lack of Transportation (Non-Medical): No  Physical Activity: Sufficiently Active (06/15/2022)   Exercise Vital Sign   . Days of  Exercise per Week: 5 days   . Minutes of Exercise per Session: 30 min  Stress: No Stress Concern Present (06/15/2022)   [employer] of Occupational Health - Occupational Stress Questionnaire   . Feeling of Stress : Only a little  Social Connections: Moderately Integrated (06/15/2022)   Social Connection and Isolation Panel [NHANES]   . Frequency of Communication with Friends and Family: More than three times a week   . Frequency of Social Gatherings with Friends and Family: More than three times a week   . Attends Religious Services: 1 to 4 times per year   . Active Member of Clubs or Organizations: Yes   .  Attends Banker Meetings: 1 to 4 times per year   . Marital Status: Divorced  Catering manager Violence: Not At Risk (06/15/2022)   Humiliation, Afraid, Rape, and Kick questionnaire   . Fear of Current or Ex-Partner: No   . Emotionally Abused: No   . Physically Abused: No   . Sexually Abused: No    Family History: Family History  Problem Relation Age of Onset  . Breast cancer Mother   . Cancer Mother   . Hypertension Mother   . Diabetes Father   . Hypertension Father   . Stroke Father   . Hypertension Sister   . Cancer Maternal Grandfather   . Colon cancer Maternal Grandfather     Review of Systems: Review of Systems  Constitutional: Negative.   Respiratory: Negative.    Cardiovascular: Negative.   Gastrointestinal: Negative.   Musculoskeletal: Negative.   Neurological: Negative.     Physical Exam: Vital Signs BP 139/88 (BP Location: Left Arm, Patient Position: Sitting, Cuff Size: Normal)   Pulse 88   Ht 5\' 7"  (1.702 m)   Wt 142 lb 1.6 oz (64.5 kg)   SpO2 96%   BMI 22.26 kg/m   Physical Exam  Constitutional:      General: Not in acute distress.    Appearance: Normal appearance. Not ill-appearing.  HENT:     Head: Normocephalic and atraumatic.  Eyes:     Pupils: Pupils are equal, round Neck:     Musculoskeletal: Normal range of motion.   Cardiovascular:     Rate and Rhythm: Normal rate    Pulses: Normal pulses.  Pulmonary:     Effort: Pulmonary effort is normal. No respiratory distress.  Musculoskeletal: Normal range of motion.  Skin:    General: Skin is warm and dry.     Findings: No erythema or rash.  Neurological:     General: No focal deficit present.     Mental Status: Alert and oriented to person, place, and time. Mental status is at baseline.     Motor: No weakness.  Psychiatric:        Mood and Affect: Mood normal.        Behavior: Behavior normal.    Assessment/Plan: The patient is scheduled for bilateral breast reconstruction placement of tissue expanders, possible placement of implants and Flex HD with Dr. Ulice Bold.  Risks, benefits, and alternatives of procedure discussed, questions answered and consent obtained.    Smoking Status: Non-smoker; Counseling Given?  N/A  Caprini Score: ***; Risk Factors include: ***, BMI *** 25, and length of planned surgery. Recommendation for mechanical *** pharmacological prophylaxis. Encourage early ambulation.   Pictures obtained: @consult   Post-op Rx sent to pharmacy: Oxycodone, Zofran, Keflex, Valium  Patient was provided with the breast reconstruction and General Surgical Risk consent document and Pain Medication Agreement prior to their appointment.  They had adequate time to read through the risk consent documents and Pain Medication Agreement. We also discussed them in person together during this preop appointment. All of their questions were answered to their satisfaction.  Recommended calling if they have any further questions.  Risk consent form and Pain Medication Agreement to be scanned into patient's chart.  The risks that can be encountered with and after placement of a breast expander placement were discussed and include the following but not limited to these: bleeding, infection, delayed healing, anesthesia risks, skin sensation changes, injury to  structures including nerves, blood vessels, and muscles which may be temporary or permanent,  allergies to tape, suture materials and glues, blood products, topical preparations or injected agents, skin contour irregularities, skin discoloration and swelling, deep vein thrombosis, cardiac and pulmonary complications, pain, which may persist, fluid accumulation, wrinkling of the skin over the expander, changes in nipple or breast sensation, expander leakage or rupture, faulty position of the expander, persistent pain, formation of tight scar tissue around the expander (capsular contracture), possible need for revisional surgery or staged procedures.  The risks that can be encountered with and after placement of a breast implant were discussed and include the following but not limited to these: bleeding, infection, delayed healing, anesthesia risks, skin sensation changes, injury to structures including nerves, blood vessels, and muscles which may be temporary or permanent, allergies to tape, suture materials and glues, blood products, topical preparations or injected agents, skin contour irregularities, skin discoloration and swelling, deep vein thrombosis, cardiac and pulmonary complications, pain, which may persist, fluid accumulation, wrinkling of the skin over the implanmt, changes in nipple or breast sensation, implant leakage or rupture, faulty position of the implant, persistent pain, formation of tight scar tissue around the implant (capsular contracture).   Electronically signed by: Kermit Balo Ysabel Stankovich, PA-C 07/13/2022 11:33 AM

## 2022-07-13 ENCOUNTER — Ambulatory Visit (INDEPENDENT_AMBULATORY_CARE_PROVIDER_SITE_OTHER): Payer: BC Managed Care – PPO | Admitting: Surgical

## 2022-07-13 ENCOUNTER — Encounter: Payer: Self-pay | Admitting: Surgical

## 2022-07-13 VITALS — BP 139/88 | HR 88 | Ht 67.0 in | Wt 142.1 lb

## 2022-07-13 DIAGNOSIS — C50911 Malignant neoplasm of unspecified site of right female breast: Secondary | ICD-10-CM

## 2022-07-13 DIAGNOSIS — C50811 Malignant neoplasm of overlapping sites of right female breast: Secondary | ICD-10-CM

## 2022-07-13 MED ORDER — ONDANSETRON HCL 4 MG PO TABS: 4.0000 mg | ORAL_TABLET | Freq: Three times a day (TID) | ORAL | 0 refills | Status: DC | PRN

## 2022-07-13 MED ORDER — CEPHALEXIN 500 MG PO CAPS: 500.0000 mg | ORAL_CAPSULE | Freq: Four times a day (QID) | ORAL | 0 refills | Status: AC

## 2022-07-13 MED ORDER — DIAZEPAM 2 MG PO TABS: 2.0000 mg | ORAL_TABLET | Freq: Two times a day (BID) | ORAL | 0 refills | Status: DC | PRN

## 2022-07-13 MED ORDER — OXYCODONE HCL 5 MG PO TABS: 5.0000 mg | ORAL_TABLET | Freq: Four times a day (QID) | ORAL | 0 refills | Status: AC | PRN

## 2022-07-16 ENCOUNTER — Other Ambulatory Visit: Payer: Self-pay

## 2022-07-16 ENCOUNTER — Encounter
Admission: RE | Admit: 2022-07-16 | Discharge: 2022-07-16 | Disposition: A | Discharge status: Home / Self Care | Payer: BC Managed Care – PPO | Source: Ambulatory Visit | Attending: Surgery | Admitting: Surgery

## 2022-07-16 DIAGNOSIS — Z01812 Encounter for preprocedural laboratory examination: Secondary | ICD-10-CM

## 2022-07-16 HISTORY — DX: Unspecified osteoarthritis, unspecified site: M19.90

## 2022-07-16 HISTORY — DX: Essential (primary) hypertension: I10

## 2022-07-16 NOTE — Patient Instructions (Addendum)
Your procedure is scheduled on: 07/26/22 - Thursday Report to the Registration Desk on the 1st floor of the [facility]. To find out your arrival time, please call [phone] between 1PM - 3PM on: 07/25/22 - Wednesday If your arrival time is 6:00 am, do not arrive prior to that time as the [facility] entrance doors do not open until 6:00 am.  REMEMBER: Instructions that are not followed completely may result in serious medical risk, up to and including death; or upon the discretion of your surgeon and anesthesiologist your surgery may need to be rescheduled.  Do not eat food after midnight the night before surgery.  No gum chewing, lozengers or hard candies.  You may however, drink CLEAR liquids up to 2 hours before you are scheduled to arrive for your surgery. Do not drink anything within 2 hours of your scheduled arrival time.  Clear liquids include: - water  - apple juice without pulp - gatorade (not RED colors) - black coffee or tea (Do NOT add milk or creamers to the coffee or tea) Do NOT drink anything that is not on this list.  TAKE THESE MEDICATIONS THE MORNING OF SURGERY WITH A SIP OF WATER:  - omeprazole (PRILOSEC)   One week prior to surgery: Stop Anti-inflammatories (NSAIDS) such as Advil, Aleve, Ibuprofen, Motrin, Naproxen, Naprosyn and Aspirin based products such as Excedrin, Goodys Powder, BC Powder.   Stop ANY OVER THE COUNTER supplements until after surgery.   You may however, continue to take Tylenol if needed for pain up until the day of surgery.  No Alcohol for 24 hours before or after surgery.  No Smoking including e-cigarettes for 24 hours prior to surgery.  No chewable tobacco products for at least 6 hours prior to surgery.  No nicotine patches on the day of surgery.  Do not use any "recreational" drugs for at least a week prior to your surgery.  Please be advised that the combination of cocaine and anesthesia may have negative outcomes, up to and  including death. If you test positive for cocaine, your surgery will be cancelled.  On the morning of surgery brush your teeth with toothpaste and water, you may rinse your mouth with mouthwash if you wish. Do not swallow any toothpaste or mouthwash.  Use CHG Soap or wipes as directed on instruction sheet.  Do not wear jewelry, make-up, hairpins, clips or nail polish.  Do not wear lotions, powders, or perfumes.   Do not shave body from the neck down 48 hours prior to surgery just in case you cut yourself which could leave a site for infection.  Also, freshly shaved skin may become irritated if using the CHG soap.  Contact lenses, hearing aids and dentures may not be worn into surgery.  Do not bring valuables to the hospital. [facility] is not responsible for any missing/lost belongings or valuables.   Notify your doctor if there is any change in your medical condition (cold, fever, infection).  Wear comfortable clothing (specific to your surgery type) to the hospital.  After surgery, you can help prevent lung complications by doing breathing exercises.  Take deep breaths and cough every 1-2 hours. Your doctor may order a device called an Incentive Spirometer to help you take deep breaths. When coughing or sneezing, hold a pillow firmly against your incision with both hands. This is called "splinting." Doing this helps protect your incision. It also decreases belly discomfort.  If you are being admitted to the hospital overnight,  leave your suitcase in the car. After surgery it may be brought to your room.  If you are being discharged the day of surgery, you will not be allowed to drive home. You will need a responsible adult (18 years or older) to drive you home and stay with you that night.   If you are taking public transportation, you will need to have a responsible adult (18 years or older) with you. Please confirm with your physician that it is acceptable to use public  transportation.   Please call the [facility] at [phone] if you have any questions about these instructions.  Surgery Visitation Policy:  Patients undergoing a surgery or procedure may have two family members or support persons with them as long as the person is not COVID-19 positive or experiencing its symptoms.   Inpatient Visitation:    Visiting hours are 7 a.m. to 8 p.m. Up to four visitors are allowed at one time in a patient room, including children. The visitors may rotate out with other people during the day. One designated support person (adult) may remain overnight.

## 2022-07-18 ENCOUNTER — Encounter: Payer: Self-pay | Admitting: Urgent Care

## 2022-07-18 ENCOUNTER — Telehealth: Payer: Self-pay | Admitting: *Deleted

## 2022-07-18 ENCOUNTER — Encounter
Admission: RE | Admit: 2022-07-18 | Discharge: 2022-07-18 | Disposition: A | Discharge status: Home / Self Care | Payer: BC Managed Care – PPO | Source: Ambulatory Visit | Attending: Surgery | Admitting: Surgery

## 2022-07-18 DIAGNOSIS — Z01818 Encounter for other preprocedural examination: Secondary | ICD-10-CM | POA: Insufficient documentation

## 2022-07-18 DIAGNOSIS — Z01812 Encounter for preprocedural laboratory examination: Secondary | ICD-10-CM

## 2022-07-18 LAB — CBC
HCT: 42.5 % (ref 36.0–46.0)
Hemoglobin: 14 g/dL (ref 12.0–15.0)
MCH: 32 pg (ref 26.0–34.0)
MCHC: 32.9 g/dL (ref 30.0–36.0)
MCV: 97 fL (ref 80.0–100.0)
Platelets: 261 10*3/uL (ref 150–400)
RBC: 4.38 MIL/uL (ref 3.87–5.11)
RDW: 12.8 % (ref 11.5–15.5)
WBC: 5.5 10*3/uL (ref 4.0–10.5)
nRBC: 0 % (ref 0.0–0.2)

## 2022-07-18 LAB — BASIC METABOLIC PANEL
Anion gap: 8 (ref 5–15)
BUN: 19 mg/dL (ref 8–23)
CO2: 28 mmol/L (ref 22–32)
Calcium: 9.7 mg/dL (ref 8.9–10.3)
Chloride: 106 mmol/L (ref 98–111)
Creatinine, Ser: 0.74 mg/dL (ref 0.44–1.00)
GFR, Estimated: >60 mL/min (ref >60)
Glucose, Bld: 104 mg/dL — ABNORMAL HIGH (ref 70–99)
Potassium: 4.2 mmol/L (ref 3.5–5.1)
Sodium: 142 mmol/L (ref 135–145)

## 2022-07-18 NOTE — Telephone Encounter (Signed)
Created in error

## 2022-07-20 ENCOUNTER — Encounter: Payer: Self-pay | Admitting: Surgery

## 2022-07-20 ENCOUNTER — Ambulatory Visit (INDEPENDENT_AMBULATORY_CARE_PROVIDER_SITE_OTHER): Payer: BC Managed Care – PPO | Admitting: Surgery

## 2022-07-20 VITALS — BP 160/90 | HR 64 | Temp 98.0°F | Ht 67.0 in | Wt 143.0 lb

## 2022-07-20 DIAGNOSIS — Z170 Estrogen receptor positive status [ER+]: Secondary | ICD-10-CM | POA: Diagnosis not present

## 2022-07-20 DIAGNOSIS — C50412 Malignant neoplasm of upper-outer quadrant of left female breast: Secondary | ICD-10-CM | POA: Diagnosis not present

## 2022-07-20 DIAGNOSIS — C50811 Malignant neoplasm of overlapping sites of right female breast: Secondary | ICD-10-CM

## 2022-07-20 NOTE — Patient Instructions (Addendum)
You will most likely be able to leave the hospital several hours after your surgery. Rarely, a patient needs to stay over night but this is a possibility.  Plan to tenatively be off work for 1-2 weeks following the surgery and may return with approximately 4 more weeks of a lifting restriction, no greater than 15 lbs.    Total or Modified Radical Mastectomy A total mastectomy and a modified radical mastectomy are surgeries that are done as part of treatment for breast cancer. Both types involve removing a breast. In a total mastectomy (simple mastectomy), all breast tissue including the nipple will be removed. In a modified radical mastectomy, lymph nodes under the arm will be removed along with the breast and nipple. Some of the lining over the muscle tissues under the breast may also be removed. These procedures may also be used to help prevent breast cancer. A preventive (prophylactic) mastectomy may be done if you are at an increased risk of breast cancer due to harmful changes (mutations) in certain genes, such as the BRCA genes. In that case, the procedure involves removing both of your breasts. This can reduce your risk of developing breast cancer in the future. For a transgender person, a total mastectomy may be done as part of a surgical transition from female to female. Let your health care provider know about: Any allergies you have. All medicines you are taking, including vitamins, herbs, eye drops, creams, and over-the-counter medicines. Any problems you or family members have had with anesthetic medicines. Any bleeding problems you have. Any surgeries you have had. Any medical conditions you have. Whether you are pregnant or may be pregnant. What are the risks? Generally, this is a safe procedure. However, problems may occur, including: Infection. Bleeding. Allergic reactions to medicines. Scar tissue. Chest numbness, sensation of throbbing, or tingling on the side of the  surgery. Fluid buildup under the skin flaps where your breast was removed (seroma). Stress or sadness from losing your breast. If you have the lymph nodes under your arm removed, you may have arm swelling, weakness, or numbness on the same side of your body as your surgery. What happens before the procedure? When to stop eating and drinking Follow instructions from your health care provider about what you may eat and drink before your procedure. These may include: 8 hours before your procedure Stop eating most foods. Do not eat meat, fried foods, or fatty foods. Eat only light foods, such as toast or crackers. All liquids are okay except energy drinks and alcohol. 6 hours before your procedure Stop eating. Drink only clear liquids, such as water, clear fruit juice, black coffee, plain tea, and sports drinks. Do not drink energy drinks or alcohol. 2 hours before your procedure Stop drinking all liquids. You may be allowed to take medicines with small sips of water. If you do not follow your health care provider's instructions, your procedure may be delayed or canceled. Medicines Ask your health care provider about: Changing or stopping your regular medicines. This is especially important if you are taking diabetes medicines or blood thinners. Taking medicines such as aspirin and ibuprofen. These medicines can thin your blood. Do not take these medicines unless your health care provider tells you to take them. Taking over-the-counter medicines, vitamins, herbs, and supplements. General instructions You may be checked for extra fluid around your lymph nodes (lymphedema). Do not use any products that contain nicotine or tobacco before the procedure. These products include cigarettes, chewing tobacco, and vaping  devices, such as e-cigarettes. If you need help quitting, ask your health care provider. Ask your health care provider about: How your surgery site will be marked. What steps will be  taken to help prevent infection. These steps may include: Removing hair at the surgery site. Washing skin with a germ-killing soap. Taking antibiotic medicine. What happens during the procedure? An IV will be inserted into one of your veins. You will be given: A medicine to help you relax (sedative). A medicine to make you fall asleep (general anesthetic). A wide incision will be made around your nipple. The skin of the breast and the nipple inside the incision will be removed along with all breast tissue. Lymph nodes under the arm on the side of the tumor will be checked to see if the cancer has spread. If you are having a modified radical mastectomy: The lining over your chest muscles will be removed. The incision may be extended to reach the lymph nodes under your arm, or a second incision may be made. Lymph nodes will be removed. Breast tissue and lymph nodes that are removed will be sent to the lab for testing. You may have a drainage tube inserted into your incision to collect fluid that builds up after surgery. This tube will be connected to a suction bulb on the outside of your body to remove the fluid. Your incision or incisions will be closed with stitches (sutures), skin glue, or adhesive strips. A bandage (dressing) will be placed over your breast area. If lymph nodes were removed, a dressing will also be placed under your arm. The procedure may vary among health care providers and hospitals. What happens after the procedure? Your blood pressure, heart rate, breathing rate, and blood oxygen level will be monitored until you leave the hospital or clinic. You will be given pain medicine as needed. Your IV can be removed when you are able to eat and drink. You may have a drainage tube in place for 2-3 days to prevent a collection of blood (hematoma) from developing in the breast area. You will be given instructions about caring for the drain before you go home. A pressure bandage may  be applied for 1-2 days to prevent bleeding or swelling. Ask your health care provider how to care for your pressure bandage at home. Summary In a total mastectomy (simple mastectomy), all breast tissue including the nipple will be removed. In a modified radical mastectomy, lymph nodes under the arm will be removed along with the breast and nipple, and the chest wall lining. Before the procedure, follow instructions from your health care provider about eating and drinking, and ask about changing or stopping your regular medicines. You may have a drainage tube inserted into your incision to collect fluid that builds up after surgery. This tube will be connected to a suction bulb on the outside of your body to remove the fluid. This information is not intended to replace advice given to you by your health care provider. Make sure you discuss any questions you have with your health care provider. Document Revised: 06/11/2021 Document Reviewed: 06/11/2021 Elsevier Patient Education  [place].

## 2022-07-20 NOTE — H&P (View-Only) (Signed)
07/20/2022  History of Present Illness: Marie Nguyen is a 66 y.o. female presenting for H&P update in preparation for surgery on 07/26/22.  The patient has history of bilateral breast cancer, both primaries, and is scheduled for bilateral mastectomies and bilateral sentinel lymph node biopsies.  She also is having immediate breast reconstruction with Dr. Marla Roe.  She has current bilateral breast implants which have been in placed for many years, and these will be removed as well.  She has seen Dr. Marla Roe and also had an appointment with Mr. Aris Everts, PA in her team, for H&P update.    Patient denies any new breast issues or concerns.  Reports that she really does not want tissue expanders and instead to have immediate implants.  There has been a discussion that she may need tissue expanders depending on the location of her current implants.  Past Medical History: Past Medical History:  Diagnosis Date   Allergy    Arthritis    Eczema    GERD (gastroesophageal reflux disease)    Heart murmur    Hypertension      Past Surgical History: Past Surgical History:  Procedure Laterality Date   ABDOMINAL HYSTERECTOMY     AUGMENTATION MAMMAPLASTY     BREAST BIOPSY Right    neg   BREAST BIOPSY Right 05/16/2022   x 2 areas/ [place] bx & Stereo bx coil clip/path pending   BREAST BIOPSY Left 05/16/2022   [place] bx/ path pending   CESAREAN SECTION     COLONOSCOPY WITH PROPOFOL N/A 11/24/2018   Procedure: COLONOSCOPY WITH PROPOFOL;  Surgeon: Jonathon Bellows, MD;  Location: [facility] ENDOSCOPY;  Service: Gastroenterology;  Laterality: N/A;   COLONOSCOPY WITH PROPOFOL N/A 07/03/2021   Procedure: COLONOSCOPY WITH PROPOFOL;  Surgeon: Jonathon Bellows, MD;  Location: [facility] ENDOSCOPY;  Service: Gastroenterology;  Laterality: N/A;   GANGLION CYST EXCISION Right    wrist   TOTAL ABDOMINAL HYSTERECTOMY      Home Medications: Prior to Admission medications   Medication Sig Start Date End Date Taking? Authorizing  Provider  acetaminophen (TYLENOL) 500 MG tablet Take 1,000 mg by mouth every 6 (six) hours as needed for mild pain.   Yes [provider]  azelastine (ASTELIN) 0.1 % nasal spray Place 1 spray into both nostrils daily. Use in each nostril as directed   Yes [provider]  diazepam (VALIUM) 2 MG tablet Take 1 tablet (2 mg total) by mouth every 12 (twelve) hours as needed for muscle spasms. 07/13/22  Yes Scheeler, Carola Rhine, PA-C  fluticasone (FLONASE) 50 MCG/ACT nasal spray Place 1 spray into both nostrils daily as needed for allergies or rhinitis.   Yes [provider]  ibuprofen (ADVIL) 200 MG tablet Take 200 mg by mouth every 6 (six) hours as needed for mild pain. 2 - 3 tabs   Yes [provider]  lidocaine-prilocaine (EMLA) cream Apply to the areola of both breasts and cover with plastic wrap one hour prior to leaving for surgery. 05/30/22  Yes Litzy Dicker, Jacqulyn Bath, MD  lisinopril-hydrochlorothiazide (ZESTORETIC) 10-12.5 MG tablet Take 1 tablet by mouth daily. 06/01/22  Yes Juline Patch, MD  Multiple Vitamin (MULTIVITAMIN) tablet Take 1 tablet by mouth daily.   Yes [provider]  Omega-3 Fatty Acids (FISH OIL) 1000 MG CAPS Take 1 capsule by mouth as needed.   Yes [provider]  omeprazole (PRILOSEC) 20 MG capsule Take 20 mg by mouth daily.   Yes [provider]  ondansetron (ZOFRAN) 4 MG  tablet Take 1 tablet (4 mg total) by mouth every 8 (eight) hours as needed for nausea or vomiting. 07/13/22  Yes Scheeler, Carola Rhine, PA-C  triamcinolone cream (KENALOG) 0.1 % 1 Application as needed. 04/10/17  Yes [provider]    Allergies: Allergies  Allergen Reactions   Latex Rash    Review of Systems: Review of Systems  Constitutional:  Negative for chills and fever.  HENT:  Negative for hearing loss.   Respiratory:  Negative for shortness of breath.   Cardiovascular:  Negative for chest pain.  Gastrointestinal:  Negative for  abdominal pain, nausea and vomiting.  Genitourinary:  Negative for dysuria.  Musculoskeletal:  Negative for myalgias.  Skin:  Negative for rash.    Physical Exam BP (!) 160/90   Pulse 64   Temp 98 F (36.7 C)   Ht '5\' 7"'$  (1.702 m)   Wt 143 lb (64.9 kg)   SpO2 98%   BMI 22.40 kg/m  CONSTITUTIONAL: No acute distress, well nourished. HEENT:  Normocephalic, atraumatic, extraocular motion intact. NECK:  Trachea is midline, no jugular venous distention. RESPIRATORY:  Lungs are clear, and breath sounds are equal bilaterally. Normal respiratory effort without pathologic use of accessory muscles. CARDIOVASCULAR: Heart is regular without murmurs, gallops, or rubs. BREAST:  Exam deferred today. MUSCULOSKELETAL:  No peripheral edema, normal gait. NEUROLOGIC:  Motor and sensation is grossly normal.  Cranial nerves are grossly intact. PSYCH:  Alert and oriented to person, place and time. Affect is normal.  Labs/Imaging: Labs from 07/18/22: Na 142, K 4.2, Cl 106, CO2 28, BUN 19, Cr 0.74.  WBC 5.5, Hgb 14, Hct 42.5, Plt 261.  Assessment and Plan: This is a 66 y.o. female with bilateral primary breast cancer.  --Discussed with the patient the surgical plan, that this would be a combined case with Dr. Marla Roe.  Discussed with her the process to expect the day of surgery, starting with nuclear medicine for radiactive tracer injection, then going to the OR, injection of methylene blue, sentinel lymph node biopsies, followed by mastectomies and reconstruction.  Reviewed the surgery at length with her including the risks of bleeding, infection, injury to surrounding structures, lymphedema, that this would likely be an overnight stay, possible drains, pain control, activity restrictions, and she's willing to proceed. --Patient is scheduled for 07/26/22.  Advised that she express her concerns about tissue expanders to Dr. Marla Roe. --All of her questions have been answered.  I spent 30 minutes  dedicated to the care of this patient on the date of this encounter to include pre-visit review of records, face-to-face time with the patient discussing diagnosis and management, and any post-visit coordination of care.   Melvyn Neth, [facility]

## 2022-07-20 NOTE — Progress Notes (Signed)
07/20/2022  History of Present Illness: Marie Nguyen is a 66 y.o. female presenting for H&P update in preparation for surgery on 07/26/22.  The patient has history of bilateral breast cancer, both primaries, and is scheduled for bilateral mastectomies and bilateral sentinel lymph node biopsies.  She also is having immediate breast reconstruction with Dr. Marla Roe.  She has current bilateral breast implants which have been in placed for many years, and these will be removed as well.  She has seen Dr. Marla Roe and also had an appointment with Mr. Aris Everts, PA in her team, for H&P update.    Patient denies any new breast issues or concerns.  Reports that she really does not want tissue expanders and instead to have immediate implants.  There has been a discussion that she may need tissue expanders depending on the location of her current implants.  Past Medical History: Past Medical History:  Diagnosis Date   Allergy    Arthritis    Eczema    GERD (gastroesophageal reflux disease)    Heart murmur    Hypertension      Past Surgical History: Past Surgical History:  Procedure Laterality Date   ABDOMINAL HYSTERECTOMY     AUGMENTATION MAMMAPLASTY     BREAST BIOPSY Right    neg   BREAST BIOPSY Right 05/16/2022   x 2 areas/ [place] bx & Stereo bx coil clip/path pending   BREAST BIOPSY Left 05/16/2022   [place] bx/ path pending   CESAREAN SECTION     COLONOSCOPY WITH PROPOFOL N/A 11/24/2018   Procedure: COLONOSCOPY WITH PROPOFOL;  Surgeon: Jonathon Bellows, MD;  [facility] ENDOSCOPY;  Service: Gastroenterology;  Laterality: N/A;   COLONOSCOPY WITH PROPOFOL N/A 07/03/2021   Procedure: COLONOSCOPY WITH PROPOFOL;  Surgeon: Jonathon Bellows, MD;  Location: [facility] ENDOSCOPY;  Service: Gastroenterology;  Laterality: N/A;   GANGLION CYST EXCISION Right    wrist   TOTAL ABDOMINAL HYSTERECTOMY      Home Medications: Prior to Admission medications   Medication Sig Start Date End Date Taking? Authorizing  Provider  acetaminophen (TYLENOL) 500 MG tablet Take 1,000 mg by mouth every 6 (six) hours as needed for mild pain.   Yes [provider]  azelastine (ASTELIN) 0.1 % nasal spray Place 1 spray into both nostrils daily. Use in each nostril as directed   Yes [provider]  diazepam (VALIUM) 2 MG tablet Take 1 tablet (2 mg total) by mouth every 12 (twelve) hours as needed for muscle spasms. 07/13/22  Yes Scheeler, Carola Rhine, PA-C  fluticasone (FLONASE) 50 MCG/ACT nasal spray Place 1 spray into both nostrils daily as needed for allergies or rhinitis.   Yes [provider]  ibuprofen (ADVIL) 200 MG tablet Take 200 mg by mouth every 6 (six) hours as needed for mild pain. 2 - 3 tabs   Yes [provider]  lidocaine-prilocaine (EMLA) cream Apply to the areola of both breasts and cover with plastic wrap one hour prior to leaving for surgery. 05/30/22  Yes Lindel Marcell, Jacqulyn Bath, MD  lisinopril-hydrochlorothiazide (ZESTORETIC) 10-12.5 MG tablet Take 1 tablet by mouth daily. 06/01/22  Yes Juline Patch, MD  Multiple Vitamin (MULTIVITAMIN) tablet Take 1 tablet by mouth daily.   Yes [provider]  Omega-3 Fatty Acids (FISH OIL) 1000 MG CAPS Take 1 capsule by mouth as needed.   Yes [provider]  omeprazole (PRILOSEC) 20 MG capsule Take 20 mg by mouth daily.   Yes [provider]  ondansetron (ZOFRAN) 4 MG  tablet Take 1 tablet (4 mg total) by mouth every 8 (eight) hours as needed for nausea or vomiting. 07/13/22  Yes Scheeler, Carola Rhine, PA-C  triamcinolone cream (KENALOG) 0.1 % 1 Application as needed. 04/10/17  Yes [provider]    Allergies: Allergies  Allergen Reactions   Latex Rash    Review of Systems: Review of Systems  Constitutional:  Negative for chills and fever.  HENT:  Negative for hearing loss.   Respiratory:  Negative for shortness of breath.   Cardiovascular:  Negative for chest pain.  Gastrointestinal:  Negative for  abdominal pain, nausea and vomiting.  Genitourinary:  Negative for dysuria.  Musculoskeletal:  Negative for myalgias.  Skin:  Negative for rash.    Physical Exam BP (!) 160/90   Pulse 64   Temp 98 F (36.7 C)   Ht '5\' 7"'$  (1.702 m)   Wt 143 lb (64.9 kg)   SpO2 98%   BMI 22.40 kg/m  CONSTITUTIONAL: No acute distress, well nourished. HEENT:  Normocephalic, atraumatic, extraocular motion intact. NECK:  Trachea is midline, no jugular venous distention. RESPIRATORY:  Lungs are clear, and breath sounds are equal bilaterally. Normal respiratory effort without pathologic use of accessory muscles. CARDIOVASCULAR: Heart is regular without murmurs, gallops, or rubs. BREAST:  Exam deferred today. MUSCULOSKELETAL:  No peripheral edema, normal gait. NEUROLOGIC:  Motor and sensation is grossly normal.  Cranial nerves are grossly intact. PSYCH:  Alert and oriented to person, place and time. Affect is normal.  Labs/Imaging: Labs from 07/18/22: Na 142, K 4.2, Cl 106, CO2 28, BUN 19, Cr 0.74.  WBC 5.5, Hgb 14, Hct 42.5, Plt 261.  Assessment and Plan: This is a 66 y.o. female with bilateral primary breast cancer.  --Discussed with the patient the surgical plan, that this would be a combined case with Dr. Marla Roe.  Discussed with her the process to expect the day of surgery, starting with nuclear medicine for radiactive tracer injection, then going to the OR, injection of methylene blue, sentinel lymph node biopsies, followed by mastectomies and reconstruction.  Reviewed the surgery at length with her including the risks of bleeding, infection, injury to surrounding structures, lymphedema, that this would likely be an overnight stay, possible drains, pain control, activity restrictions, and she's willing to proceed. --Patient is scheduled for 07/26/22.  Advised that she express her concerns about tissue expanders to Dr. Marla Roe. --All of her questions have been answered.  I spent 30 minutes  dedicated to the care of this patient on the date of this encounter to include pre-visit review of records, face-to-face time with the patient discussing diagnosis and management, and any post-visit coordination of care.   Melvyn Neth, [facility]

## 2022-07-26 ENCOUNTER — Ambulatory Visit: Payer: BC Managed Care – PPO | Admitting: Anesthesiology

## 2022-07-26 ENCOUNTER — Inpatient Hospital Stay
Admission: RE | Admit: 2022-07-26 | Discharge: 2022-07-26 | Disposition: A | Discharge status: Home / Self Care | Payer: BC Managed Care – PPO | Source: Ambulatory Visit | Attending: Surgery | Admitting: Surgery

## 2022-07-26 ENCOUNTER — Inpatient Hospital Stay
Admission: AD | Admit: 2022-07-26 | Discharge: 2022-07-28 | DRG: 581 | Disposition: A | Discharge status: Home / Self Care | Payer: BC Managed Care – PPO | Attending: Surgery | Admitting: Surgery

## 2022-07-26 ENCOUNTER — Ambulatory Visit
Admission: RE | Admit: 2022-07-26 | Discharge: 2022-07-26 | Disposition: A | Discharge status: Home / Self Care | Payer: BC Managed Care – PPO | Source: Ambulatory Visit | Attending: Surgery | Admitting: Surgery

## 2022-07-26 ENCOUNTER — Encounter: Payer: Self-pay | Admitting: Surgery

## 2022-07-26 ENCOUNTER — Other Ambulatory Visit: Payer: Self-pay

## 2022-07-26 ENCOUNTER — Encounter
Admission: AD | Disposition: A | Discharge status: Home / Self Care | Payer: Self-pay | Source: Home / Self Care | Attending: Surgery

## 2022-07-26 DIAGNOSIS — Z8249 Family history of ischemic heart disease and other diseases of the circulatory system: Secondary | ICD-10-CM

## 2022-07-26 DIAGNOSIS — Z9882 Breast implant status: Secondary | ICD-10-CM

## 2022-07-26 DIAGNOSIS — R110 Nausea: Secondary | ICD-10-CM | POA: Diagnosis not present

## 2022-07-26 DIAGNOSIS — C50412 Malignant neoplasm of upper-outer quadrant of left female breast: Secondary | ICD-10-CM

## 2022-07-26 DIAGNOSIS — C50911 Malignant neoplasm of unspecified site of right female breast: Principal | ICD-10-CM | POA: Diagnosis present

## 2022-07-26 DIAGNOSIS — C50311 Malignant neoplasm of lower-inner quadrant of right female breast: Secondary | ICD-10-CM | POA: Diagnosis not present

## 2022-07-26 DIAGNOSIS — Z823 Family history of stroke: Secondary | ICD-10-CM | POA: Diagnosis not present

## 2022-07-26 DIAGNOSIS — Z91040 Latex allergy status: Secondary | ICD-10-CM | POA: Diagnosis not present

## 2022-07-26 DIAGNOSIS — Z170 Estrogen receptor positive status [ER+]: Secondary | ICD-10-CM

## 2022-07-26 DIAGNOSIS — Z833 Family history of diabetes mellitus: Secondary | ICD-10-CM | POA: Diagnosis not present

## 2022-07-26 DIAGNOSIS — T8541XA Breakdown (mechanical) of breast prosthesis and implant, initial encounter: Secondary | ICD-10-CM | POA: Diagnosis not present

## 2022-07-26 DIAGNOSIS — K219 Gastro-esophageal reflux disease without esophagitis: Secondary | ICD-10-CM | POA: Diagnosis present

## 2022-07-26 DIAGNOSIS — Z803 Family history of malignant neoplasm of breast: Secondary | ICD-10-CM

## 2022-07-26 DIAGNOSIS — C50912 Malignant neoplasm of unspecified site of left female breast: Secondary | ICD-10-CM | POA: Diagnosis not present

## 2022-07-26 DIAGNOSIS — C50811 Malignant neoplasm of overlapping sites of right female breast: Secondary | ICD-10-CM

## 2022-07-26 DIAGNOSIS — C50019 Malignant neoplasm of nipple and areola, unspecified female breast: Secondary | ICD-10-CM | POA: Diagnosis not present

## 2022-07-26 DIAGNOSIS — C50312 Malignant neoplasm of lower-inner quadrant of left female breast: Secondary | ICD-10-CM | POA: Diagnosis not present

## 2022-07-26 DIAGNOSIS — Z421 Encounter for breast reconstruction following mastectomy: Secondary | ICD-10-CM

## 2022-07-26 DIAGNOSIS — Z800 Family history of malignant neoplasm of digestive organs: Secondary | ICD-10-CM

## 2022-07-26 DIAGNOSIS — Z79899 Other long term (current) drug therapy: Secondary | ICD-10-CM

## 2022-07-26 HISTORY — PX: AXILLARY SENTINEL NODE BIOPSY: SHX5738

## 2022-07-26 HISTORY — PX: BREAST RECONSTRUCTION WITH PLACEMENT OF TISSUE EXPANDER AND ALLODERM: SHX6805

## 2022-07-26 HISTORY — PX: SIMPLE MASTECTOMY WITH AXILLARY SENTINEL NODE BIOPSY: SHX6098

## 2022-07-26 SURGERY — SIMPLE MASTECTOMY
Anesthesia: General | Laterality: Bilateral

## 2022-07-26 MED ORDER — ONDANSETRON HCL 4 MG/2ML IJ SOLN
4.0000 mg | Freq: Four times a day (QID) | INTRAMUSCULAR | Status: DC | PRN
  Administered 2022-07-27: 4 mg via INTRAVENOUS
  Filled 2022-07-26: qty 2

## 2022-07-26 MED ORDER — ACETAMINOPHEN 10 MG/ML IV SOLN
INTRAVENOUS | Status: AC
  Administered 2022-07-26: 1000 mg via INTRAVENOUS
  Filled 2022-07-26: qty 100

## 2022-07-26 MED ORDER — ROCURONIUM BROMIDE 100 MG/10ML IV SOLN
INTRAVENOUS | Status: DC | PRN
  Administered 2022-07-26: 20 mg via INTRAVENOUS
  Administered 2022-07-26: 10 mg via INTRAVENOUS
  Administered 2022-07-26: 40 mg via INTRAVENOUS
  Administered 2022-07-26: 50 mg via INTRAVENOUS
  Administered 2022-07-26: 30 mg via INTRAVENOUS
  Administered 2022-07-26: 20 mg via INTRAVENOUS
  Administered 2022-07-26: 30 mg via INTRAVENOUS

## 2022-07-26 MED ORDER — ONDANSETRON HCL 4 MG/2ML IJ SOLN
INTRAMUSCULAR | Status: DC | PRN
  Administered 2022-07-26: 4 mg via INTRAVENOUS

## 2022-07-26 MED ORDER — SODIUM CHLORIDE FLUSH 0.9 % IV SOLN
INTRAVENOUS | Status: AC
  Filled 2022-07-26: qty 10

## 2022-07-26 MED ORDER — ACETAMINOPHEN 500 MG PO TABS
1000.0000 mg | ORAL_TABLET | Freq: Four times a day (QID) | ORAL | Status: DC
  Administered 2022-07-26 – 2022-07-28 (×6): 1000 mg via ORAL
  Filled 2022-07-26 (×6): qty 2

## 2022-07-26 MED ORDER — SUGAMMADEX SODIUM 200 MG/2ML IV SOLN
INTRAVENOUS | Status: DC | PRN
  Administered 2022-07-26: 200 mg via INTRAVENOUS

## 2022-07-26 MED ORDER — SODIUM CHLORIDE (PF) 0.9 % IJ SOLN
INTRAVENOUS | Status: DC | PRN
  Administered 2022-07-26 (×2): 5 mL

## 2022-07-26 MED ORDER — DEXAMETHASONE SODIUM PHOSPHATE 10 MG/ML IJ SOLN
INTRAMUSCULAR | Status: AC
  Filled 2022-07-26: qty 1

## 2022-07-26 MED ORDER — CEFAZOLIN SODIUM-DEXTROSE 2-4 GM/100ML-% IV SOLN
2.0000 g | Freq: Three times a day (TID) | INTRAVENOUS | Status: DC
  Administered 2022-07-26 – 2022-07-28 (×5): 2 g via INTRAVENOUS
  Filled 2022-07-26 (×6): qty 100

## 2022-07-26 MED ORDER — PHENYLEPHRINE HCL (PRESSORS) 10 MG/ML IV SOLN
INTRAVENOUS | Status: DC | PRN
  Administered 2022-07-26: 80 ug via INTRAVENOUS

## 2022-07-26 MED ORDER — OXYCODONE HCL 5 MG/5ML PO SOLN: 5.0000 mg | Freq: Once | ORAL | Status: DC | PRN

## 2022-07-26 MED ORDER — ONDANSETRON 4 MG PO TBDP: 4.0000 mg | ORAL_TABLET | Freq: Four times a day (QID) | ORAL | Status: DC | PRN

## 2022-07-26 MED ORDER — DEXAMETHASONE SODIUM PHOSPHATE 10 MG/ML IJ SOLN
INTRAMUSCULAR | Status: DC | PRN
  Administered 2022-07-26: 10 mg via INTRAVENOUS

## 2022-07-26 MED ORDER — MIDAZOLAM HCL 2 MG/2ML IJ SOLN
INTRAMUSCULAR | Status: DC | PRN
  Administered 2022-07-26: 2 mg via INTRAVENOUS
  Administered 2022-07-26 (×2): 1 mg via INTRAVENOUS

## 2022-07-26 MED ORDER — TECHNETIUM TC 99M TILMANOCEPT KIT
0.9490 | PACK | Freq: Once | INTRAVENOUS | Status: AC | PRN
  Administered 2022-07-26: 0.949 via INTRADERMAL

## 2022-07-26 MED ORDER — HYDROMORPHONE HCL 1 MG/ML IJ SOLN: 0.5000 mg | INTRAMUSCULAR | Status: DC | PRN

## 2022-07-26 MED ORDER — METHYLENE BLUE 1 % INJ SOLN
INTRAVENOUS | Status: AC
  Filled 2022-07-26: qty 10

## 2022-07-26 MED ORDER — BUPIVACAINE LIPOSOME 1.3 % IJ SUSP: 20.0000 mL | Freq: Once | INTRAMUSCULAR | Status: DC

## 2022-07-26 MED ORDER — LIDOCAINE HCL (CARDIAC) PF 100 MG/5ML IV SOSY
PREFILLED_SYRINGE | INTRAVENOUS | Status: DC | PRN
  Administered 2022-07-26: 60 mg via INTRAVENOUS

## 2022-07-26 MED ORDER — MIDAZOLAM HCL 2 MG/2ML IJ SOLN
INTRAMUSCULAR | Status: AC
  Filled 2022-07-26: qty 2

## 2022-07-26 MED ORDER — SODIUM CHLORIDE (PF) 0.9 % IJ SOLN
INTRAMUSCULAR | Status: DC | PRN
  Administered 2022-07-26: 30 mL
  Administered 2022-07-26: 25 mL
  Administered 2022-07-26 (×2): 20 mL

## 2022-07-26 MED ORDER — CHLORHEXIDINE GLUCONATE 0.12 % MT SOLN: 15.0000 mL | Freq: Once | OROMUCOSAL | Status: AC

## 2022-07-26 MED ORDER — GABAPENTIN 300 MG PO CAPS
ORAL_CAPSULE | ORAL | Status: AC
  Administered 2022-07-26: 300 mg via ORAL
  Filled 2022-07-26: qty 1

## 2022-07-26 MED ORDER — ONDANSETRON HCL 4 MG/2ML IJ SOLN: 4.0000 mg | Freq: Once | INTRAMUSCULAR | Status: AC

## 2022-07-26 MED ORDER — ACETAMINOPHEN 500 MG PO TABS
ORAL_TABLET | ORAL | Status: AC
  Administered 2022-07-26: 1000 mg via ORAL
  Filled 2022-07-26: qty 2

## 2022-07-26 MED ORDER — CEFAZOLIN SODIUM-DEXTROSE 2-4 GM/100ML-% IV SOLN
INTRAVENOUS | Status: AC
  Filled 2022-07-26: qty 100

## 2022-07-26 MED ORDER — CEFAZOLIN SODIUM-DEXTROSE 2-4 GM/100ML-% IV SOLN
2.0000 g | INTRAVENOUS | Status: AC
  Administered 2022-07-26 (×2): 2 g via INTRAVENOUS

## 2022-07-26 MED ORDER — OXYCODONE HCL 5 MG PO TABS
5.0000 mg | ORAL_TABLET | ORAL | Status: DC | PRN
  Administered 2022-07-26 – 2022-07-27 (×2): 5 mg via ORAL
  Filled 2022-07-26: qty 2
  Filled 2022-07-26: qty 1

## 2022-07-26 MED ORDER — ORAL CARE MOUTH RINSE: 15.0000 mL | Freq: Once | OROMUCOSAL | Status: AC

## 2022-07-26 MED ORDER — GABAPENTIN 300 MG PO CAPS: 300.0000 mg | ORAL_CAPSULE | ORAL | Status: AC

## 2022-07-26 MED ORDER — LISINOPRIL 10 MG PO TABS
10.0000 mg | ORAL_TABLET | Freq: Every day | ORAL | Status: DC
  Administered 2022-07-26 – 2022-07-28 (×2): 10 mg via ORAL
  Filled 2022-07-26 (×3): qty 1

## 2022-07-26 MED ORDER — LACTATED RINGERS IV SOLN: INTRAVENOUS | Status: DC

## 2022-07-26 MED ORDER — HYDROCHLOROTHIAZIDE 12.5 MG PO TABS
12.5000 mg | ORAL_TABLET | Freq: Every day | ORAL | Status: DC
  Administered 2022-07-26 – 2022-07-28 (×2): 12.5 mg via ORAL
  Filled 2022-07-26 (×3): qty 1

## 2022-07-26 MED ORDER — ORAL CARE MOUTH RINSE: 15.0000 mL | OROMUCOSAL | Status: DC | PRN

## 2022-07-26 MED ORDER — CEFAZOLIN SODIUM-DEXTROSE 2-4 GM/100ML-% IV SOLN: 2.0000 g | INTRAVENOUS | Status: DC

## 2022-07-26 MED ORDER — LISINOPRIL-HYDROCHLOROTHIAZIDE 10-12.5 MG PO TABS: 1.0000 | ORAL_TABLET | Freq: Every day | ORAL | Status: DC

## 2022-07-26 MED ORDER — ACETAMINOPHEN 500 MG PO TABS: 1000.0000 mg | ORAL_TABLET | ORAL | Status: AC

## 2022-07-26 MED ORDER — BUPIVACAINE-EPINEPHRINE (PF) 0.5% -1:200000 IJ SOLN
INTRAMUSCULAR | Status: AC
  Filled 2022-07-26: qty 30

## 2022-07-26 MED ORDER — TECHNETIUM TC 99M TILMANOCEPT KIT
1.0400 | PACK | Freq: Once | INTRAVENOUS | Status: AC | PRN
  Administered 2022-07-26: 1.04 via INTRADERMAL

## 2022-07-26 MED ORDER — POLYETHYLENE GLYCOL 3350 17 G PO PACK: 17.0000 g | PACK | Freq: Every day | ORAL | Status: DC | PRN

## 2022-07-26 MED ORDER — ROCURONIUM BROMIDE 10 MG/ML (PF) SYRINGE
PREFILLED_SYRINGE | INTRAVENOUS | Status: AC
  Filled 2022-07-26: qty 20

## 2022-07-26 MED ORDER — PANTOPRAZOLE SODIUM 40 MG PO TBEC
40.0000 mg | DELAYED_RELEASE_TABLET | Freq: Every day | ORAL | Status: DC
  Administered 2022-07-26 – 2022-07-28 (×3): 40 mg via ORAL
  Filled 2022-07-26 (×3): qty 1

## 2022-07-26 MED ORDER — SODIUM CHLORIDE (PF) 0.9 % IJ SOLN
INTRAMUSCULAR | Status: AC
  Filled 2022-07-26: qty 50

## 2022-07-26 MED ORDER — ACETAMINOPHEN 10 MG/ML IV SOLN: 1000.0000 mg | Freq: Once | INTRAVENOUS | Status: AC

## 2022-07-26 MED ORDER — DEXMEDETOMIDINE HCL IN NACL 200 MCG/50ML IV SOLN
INTRAVENOUS | Status: DC | PRN
  Administered 2022-07-26: 8 ug via INTRAVENOUS
  Administered 2022-07-26: 4 ug via INTRAVENOUS
  Administered 2022-07-26: 8 ug via INTRAVENOUS

## 2022-07-26 MED ORDER — CHLORHEXIDINE GLUCONATE 0.12 % MT SOLN
OROMUCOSAL | Status: AC
  Administered 2022-07-26: 15 mL via OROMUCOSAL
  Filled 2022-07-26: qty 15

## 2022-07-26 MED ORDER — NEOMYCIN-POLYMYXIN B GU 40-200000 IR SOLN
Status: DC | PRN
  Administered 2022-07-26: 4 mL

## 2022-07-26 MED ORDER — PROPOFOL 10 MG/ML IV BOLUS
INTRAVENOUS | Status: DC | PRN
  Administered 2022-07-26: 130 mg via INTRAVENOUS

## 2022-07-26 MED ORDER — FENTANYL CITRATE (PF) 100 MCG/2ML IJ SOLN
INTRAMUSCULAR | Status: DC | PRN
  Administered 2022-07-26 (×4): 50 ug via INTRAVENOUS

## 2022-07-26 MED ORDER — HYDROMORPHONE HCL 1 MG/ML IJ SOLN
INTRAMUSCULAR | Status: AC
  Administered 2022-07-26: 0.5 mg via INTRAVENOUS
  Filled 2022-07-26: qty 1

## 2022-07-26 MED ORDER — FENTANYL CITRATE (PF) 100 MCG/2ML IJ SOLN
INTRAMUSCULAR | Status: AC
  Filled 2022-07-26: qty 2

## 2022-07-26 MED ORDER — HYDROMORPHONE HCL 1 MG/ML IJ SOLN
0.2500 mg | INTRAMUSCULAR | Status: DC | PRN
  Administered 2022-07-26 (×2): 0.5 mg via INTRAVENOUS

## 2022-07-26 MED ORDER — BUPIVACAINE LIPOSOME 1.3 % IJ SUSP
INTRAMUSCULAR | Status: AC
  Filled 2022-07-26: qty 20

## 2022-07-26 MED ORDER — EPHEDRINE SULFATE (PRESSORS) 50 MG/ML IJ SOLN
INTRAMUSCULAR | Status: DC | PRN
  Administered 2022-07-26 (×3): 5 mg via INTRAVENOUS

## 2022-07-26 MED ORDER — ONDANSETRON HCL 4 MG/2ML IJ SOLN
INTRAMUSCULAR | Status: AC
  Administered 2022-07-26: 4 mg via INTRAVENOUS
  Filled 2022-07-26: qty 2

## 2022-07-26 MED ORDER — PHENYLEPHRINE HCL-NACL 20-0.9 MG/250ML-% IV SOLN
INTRAVENOUS | Status: DC | PRN
  Administered 2022-07-26: 50 ug/min via INTRAVENOUS

## 2022-07-26 MED ORDER — ONDANSETRON HCL 4 MG/2ML IJ SOLN
INTRAMUSCULAR | Status: AC
  Filled 2022-07-26: qty 2

## 2022-07-26 MED ORDER — CHLORHEXIDINE GLUCONATE CLOTH 2 % EX PADS
6.0000 | MEDICATED_PAD | Freq: Once | CUTANEOUS | Status: AC
  Administered 2022-07-26: 6 via TOPICAL

## 2022-07-26 MED ORDER — CEFAZOLIN SODIUM 1 G IJ SOLR
INTRAMUSCULAR | Status: AC
  Filled 2022-07-26: qty 20

## 2022-07-26 MED ORDER — OXYCODONE HCL 5 MG PO TABS: 5.0000 mg | ORAL_TABLET | Freq: Once | ORAL | Status: DC | PRN

## 2022-07-26 MED ORDER — HEMOSTATIC AGENTS (NO CHARGE) OPTIME
TOPICAL | Status: DC | PRN
  Administered 2022-07-26 (×2): 1 via TOPICAL

## 2022-07-26 MED ORDER — OXYCODONE HCL 5 MG PO TABS
ORAL_TABLET | ORAL | Status: AC
  Administered 2022-07-26: 10 mg via ORAL
  Filled 2022-07-26: qty 2

## 2022-07-26 MED ORDER — BUPIVACAINE-EPINEPHRINE (PF) 0.25% -1:200000 IJ SOLN
INTRAMUSCULAR | Status: AC
  Filled 2022-07-26: qty 30

## 2022-07-26 MED ORDER — NEOMYCIN-POLYMYXIN B GU 40-200000 IR SOLN
Status: AC
  Filled 2022-07-26: qty 20

## 2022-07-26 MED ORDER — STERILE WATER FOR IRRIGATION IR SOLN
Status: DC | PRN
  Administered 2022-07-26: 120 mL

## 2022-07-26 SURGICAL SUPPLY — 93 items
APPLIER CLIP 11 MED OPEN (CLIP)
APPLIER CLIP 9.375 SM OPEN (CLIP) ×3
BAG DECANTER FOR FLEXI CONT (MISCELLANEOUS) ×1 IMPLANT
BINDER BREAST XLRG (GAUZE/BANDAGES/DRESSINGS) IMPLANT
BIOPATCH WHT 1IN DISK W/4.0 H (GAUZE/BANDAGES/DRESSINGS) ×2 IMPLANT
BLADE BOVIE TIP EXT 4 (BLADE) ×1 IMPLANT
BLADE PHOTON ILLUMINATED (MISCELLANEOUS) IMPLANT
BLADE SURG 15 STRL LF DISP TIS (BLADE) ×2 IMPLANT
BLADE SURG 15 STRL SS (BLADE) ×2
BNDG GAUZE DERMACEA FLUFF 4 (GAUZE/BANDAGES/DRESSINGS) ×2 IMPLANT
BULB RESERV EVAC DRAIN JP 100C (MISCELLANEOUS) ×2 IMPLANT
CHLORAPREP W/TINT 26 (MISCELLANEOUS) ×1 IMPLANT
CLIP APPLIE 11 MED OPEN (CLIP) IMPLANT
CLIP APPLIE 9.375 SM OPEN (CLIP) IMPLANT
CNTNR SPEC 2.5X3XGRAD LEK (MISCELLANEOUS)
CONT SPEC 4OZ STER OR WHT (MISCELLANEOUS)
CONTAINER SPEC 2.5X3XGRAD LEK (MISCELLANEOUS) ×4 IMPLANT
COVER PROBE GAMMA FINDER SLV (MISCELLANEOUS) ×1 IMPLANT
DERMABOND ADVANCED .7 DNX12 (GAUZE/BANDAGES/DRESSINGS) ×3 IMPLANT
DRAIN CHANNEL JP 15F RND 16 (MISCELLANEOUS) IMPLANT
DRAIN CHANNEL JP 19F (MISCELLANEOUS) ×2 IMPLANT
DRAPE LAPAROTOMY TRNSV 106X77 (MISCELLANEOUS) ×1 IMPLANT
DRSG OPSITE POSTOP 4X8 (GAUZE/BANDAGES/DRESSINGS) IMPLANT
DRSG TEGADERM 2-3/8X2-3/4 SM (GAUZE/BANDAGES/DRESSINGS) IMPLANT
ELECT BLADE 6.5 EXT (BLADE) IMPLANT
ELECT CAUTERY BLADE 6.4 (BLADE) ×2 IMPLANT
ELECT CAUTERY BLADE TIP 2.5 (TIP) ×2
ELECT REM PT RETURN 9FT ADLT (ELECTROSURGICAL) ×1
ELECTRODE CAUTERY BLDE TIP 2.5 (TIP) ×2 IMPLANT
ELECTRODE REM PT RTRN 9FT ADLT (ELECTROSURGICAL) ×1 IMPLANT
GAUZE 4X4 16PLY ~~LOC~~+RFID DBL (SPONGE) ×2 IMPLANT
GLOVE BIO SURGEON STRL SZ 6.5 (GLOVE) ×4 IMPLANT
GLOVE SURG SYN 7.0 (GLOVE) ×2 IMPLANT
GLOVE SURG SYN 7.0 PF PI (GLOVE) ×1 IMPLANT
GLOVE SURG SYN 7.5  E (GLOVE) ×2
GLOVE SURG SYN 7.5 E (GLOVE) ×2 IMPLANT
GLOVE SURG SYN 7.5 PF PI (GLOVE) ×1 IMPLANT
GLOVE SURG UNDER POLY LF SZ7.5 (GLOVE) ×1 IMPLANT
GOWN STRL REUS W/ TWL LRG LVL3 (GOWN DISPOSABLE) ×6 IMPLANT
GOWN STRL REUS W/TWL LRG LVL3 (GOWN DISPOSABLE) ×6
HEMOSTAT ARISTA ABSORB 3G PWDR (HEMOSTASIS) IMPLANT
IMPL EXPANDER BREAST 455CC (Breast) IMPLANT
IMPLANT EXPANDER BREAST 455CC (Breast) ×2 IMPLANT
IV NS 1000ML (IV SOLUTION) ×1
IV NS 1000ML BAXH (IV SOLUTION) IMPLANT
IV NS 500ML (IV SOLUTION)
IV NS 500ML BAXH (IV SOLUTION) IMPLANT
KIT FILL ASEPTIC TRANSFER (MISCELLANEOUS) ×1 IMPLANT
KIT MARKER MARGIN INK (KITS) IMPLANT
KIT TURNOVER KIT A (KITS) ×1 IMPLANT
LABEL OR SOLS (LABEL) ×1 IMPLANT
MANIFOLD NEPTUNE II (INSTRUMENTS) ×2 IMPLANT
NDL FILTER BLUNT 18X1 1/2 (NEEDLE) ×3 IMPLANT
NDL HYPO 25X1 1.5 SAFETY (NEEDLE) ×1 IMPLANT
NEEDLE FILTER BLUNT 18X1 1/2 (NEEDLE) ×3 IMPLANT
NEEDLE HYPO 22GX1.5 SAFETY (NEEDLE) ×2 IMPLANT
NEEDLE HYPO 25X1 1.5 SAFETY (NEEDLE) ×2 IMPLANT
PACK BASIN MAJOR ARMC (MISCELLANEOUS) ×2 IMPLANT
PACK BASIN MINOR ARMC (MISCELLANEOUS) ×1 IMPLANT
PACK SPY-PHI (KITS) IMPLANT
PACK UNIVERSAL (MISCELLANEOUS) IMPLANT
PAD ABD DERMACEA PRESS 5X9 (GAUZE/BANDAGES/DRESSINGS) ×4 IMPLANT
PIN SAFETY STRL (MISCELLANEOUS) ×1 IMPLANT
SIZER BREAST REUSE STRL 350CC (SIZER) ×1
SIZER BRST REUSE STRL 350CC (SIZER) IMPLANT
SOL PREP PVP 2OZ (MISCELLANEOUS)
SOLUTION PREP PVP 2OZ (MISCELLANEOUS) ×2 IMPLANT
SPIKE FLUID TRANSFER (MISCELLANEOUS) IMPLANT
SPONGE T-LAP 18X18 ~~LOC~~+RFID (SPONGE) ×7 IMPLANT
STRIP CLOSURE SKIN 1/2X4 (GAUZE/BANDAGES/DRESSINGS) IMPLANT
SUT MNCRL 3-0 UNDYED SH (SUTURE) ×2 IMPLANT
SUT MNCRL 4-0 (SUTURE) ×5
SUT MNCRL 4-0 27XMFL (SUTURE) ×5
SUT MNCRL+ 5-0 UNDYED PC-3 (SUTURE) ×2 IMPLANT
SUT MONOCRYL 3-0 UNDYED (SUTURE) ×2
SUT MONOCRYL 5-0 (SUTURE) ×2
SUT PDS AB 2-0 CT1 27 (SUTURE) IMPLANT
SUT PDS PLUS 2 (SUTURE) ×7
SUT PDS PLUS AB 2-0 CT-1 (SUTURE) ×6 IMPLANT
SUT SILK 4 0 SH (SUTURE) ×2 IMPLANT
SUT VIC AB 2-0 CT1 (SUTURE) ×2 IMPLANT
SUT VIC AB 3-0 SH 27 (SUTURE) ×1
SUT VIC AB 3-0 SH 27X BRD (SUTURE) ×1 IMPLANT
SUTURE MNCRL 4-0 27XMF (SUTURE) ×4 IMPLANT
SYR 10ML LL (SYRINGE) ×4 IMPLANT
SYR 30ML LL (SYRINGE) ×2 IMPLANT
SYR BULB IRRIG 60ML STRL (SYRINGE) ×2 IMPLANT
TAPE TRANSPORE STRL 2 31045 (GAUZE/BANDAGES/DRESSINGS) IMPLANT
TISSUE FLEXHD PERF PLIAB 8X16 (Tissue) IMPLANT
TOWEL OR 17X26 4PK STRL BLUE (TOWEL DISPOSABLE) ×1 IMPLANT
TRAP FLUID SMOKE EVACUATOR (MISCELLANEOUS) ×2 IMPLANT
WATER STERILE IRR 1000ML POUR (IV SOLUTION) ×1 IMPLANT
WATER STERILE IRR 500ML POUR (IV SOLUTION) ×2 IMPLANT

## 2022-07-26 NOTE — Interval H&P Note (Signed)
History and Physical Interval Note:  07/26/2022 10:51 AM  Marie Nguyen  has presented today for surgery, with the diagnosis of Bilateral breast cancer.  The various methods of treatment have been discussed with the patient and family. After consideration of risks, benefits and other options for treatment, the patient has consented to  Procedure(s): SIMPLE MASTECTOMY, Otho Ket, PA-C to assist (Bilateral) AXILLARY SENTINEL NODE BIOPSY (Bilateral) BREAST RECONSTRUCTION WITH PLACEMENT OF TISSUE EXPANDER AND ALLODERM (Bilateral) as a surgical intervention.  The patient's history has been reviewed, patient examined, no change in status, stable for surgery.  I have reviewed the patient's chart and labs.  Questions were answered to the patient's satisfaction.     Mazen Marcin

## 2022-07-26 NOTE — Anesthesia Postprocedure Evaluation (Signed)
Anesthesia Post Note  Patient: Marie Nguyen  Procedure(s) Performed: SIMPLE MASTECTOMY, Otho Ket, PA-C to assist (Bilateral) AXILLARY SENTINEL NODE BIOPSY (Bilateral) BREAST RECONSTRUCTION WITH PLACEMENT OF TISSUE EXPANDER AND ALLODERM (Bilateral)  Patient location during evaluation: PACU Anesthesia Type: General Level of consciousness: awake and alert Pain management: pain level controlled Vital Signs Assessment: post-procedure vital signs reviewed and stable Respiratory status: spontaneous breathing, nonlabored ventilation, respiratory function stable and patient connected to nasal cannula oxygen Cardiovascular status: blood pressure returned to baseline and stable Postop Assessment: no apparent nausea or vomiting Anesthetic complications: no  No notable events documented.   Last Vitals:  Vitals:   07/26/22 1700 07/26/22 1715  BP: (!) 144/86 113/81  Pulse: 97 88  Resp: 17 11  Temp: 36.7 C   SpO2: 98% 94%    Last Pain:  Vitals:   07/26/22 1715  TempSrc:   PainSc: 7                  [name] [name]

## 2022-07-26 NOTE — Op Note (Addendum)
Pre-operative Diagnosis: Bilateral Breast Cancer , left invasive mammary carcinoma   Post-operative Diagnosis: Same   Surgeon: Caroleen Hamman,  MD FACS  Anesthesia: GETA  Procedure:  1.Left Simple mastectomy 2. Left Axillary sentinel node biopsy  Findings: NO gross evidence of cancer involvement of axillary nodes Three sentinel nodes on left axilla Counts: 2289, 1081 and 756 Viable flaps at the end of mastectomy  Estimated Blood Loss: 10 cc my portion         Drains: per plastics         Specimens:  mastectomy with labels, Sentinel node x3        Complications: none                 Condition: Stable   Operation performed with curative intent:Yes   Tracer(s) used to identify sentinel nodes in the upfront surgery (non-neoadjuvant) setting (select all that apply):Dye and Radioactive Tracer   Tracer(s) used to identify sentinel nodes in the neoadjuvant setting (select all that apply):N/A   All nodes (colored or non-colored) present at the end of a dye-filled lymphatic channel were removed:Yes    All significantly radioactive nodes were removed:Yes   All palpable suspicious nodes were removed:Yes   Biopsy-proven positive nodes marked with clips prior to chemotherapy were identified and removed:N/A  Procedure Details  The patient was seen again in the Holding Room. The benefits, complications, treatment options, and expected outcomes were discussed with the patient. The risks of bleeding, infection, recurrence of symptoms, failure to resolve symptoms, hematoma, seroma, open wound, cosmetic deformity, and the need for further surgery were discussed.  The patient was taken to Operating Room, identified  and the procedure verified.  A Time Out was held and the above information confirmed. The pt has a complex breast Hx bilateral primary breast CA w prior breast implants. Given complexity and decrease OR times using two surgeons decision was made for each general surgeon to do a  simple mastectomy with SLNBx.  Dr. Hampton Abbot injected both breast with methylene blue and Dr. Marla Roe had marked the skin incision for optimal cosmetic outcomes with reconstruction.  Prior to the induction of general anesthesia, antibiotic prophylaxis was administered. VTE prophylaxis was in place. Appropriate anesthesia was then administered and tolerated well. The chest was prepped with Chloraprep and draped in the sterile fashion. The patient was positioned in the supine position.   Using the hand-held probe an area of high counts was identified in the left axilla, an incision was made and direction by the probe aided in dissection of a lymph node . I clipped the small lymphatic channels in the standard fashion. A total of three nodes that were both hot and blue were excised.   Once assuring that hemostasis was adequate and checked multiple times the wound was closed with 3-0 vicryl for dermis and 4-0 subcuticular Monocryl sutures for skin . Dermabond was placed  Attention was turned to the left breast elliptical incision was created incorporating the nipple.  We performed appropriate flaps maintaining appropriate vasculature of the skin.  Our margins of the dissections were the clavicle superiorly latissimus dorsi laterally mammary fold inferiorly and sternum medially.  Please note that there was significant scarring tissue and the plane was not easy to identify due to the prior breast surgery the patient had.   We were able to obtain good hemostasis with electrocautery.  Once the specimen was removed it was marked and sent for permanent pathology.  The case then was turned to Dr.  Dillingham that we will proceed with excision of submuscular implants and reconstruction.  Please see the report for further details.    Patient was taken to the recovery room in stable condition.   Caroleen Hamman , MD, FACS

## 2022-07-26 NOTE — Op Note (Signed)
Procedure Date:  07/26/2022  Pre-operative Diagnosis:  Bilateral breast cancer  Post-operative Diagnosis: Bilateral breast cancer  Procedure:   Right axillary sentinel lymph node biopsy Right simple mastectomy  Surgeon:  Melvyn Neth, MD  Co-Surgeons:  Caroleen Hamman, MD and Audelia Hives, MD.  Dr. Dahlia Byes performed the left sided mastectomy and sentinel lymph node biopsy, and Dr. Marla Roe performed the bilateral breast reconstruction.  Anesthesia:  General endotracheal  Estimated Blood Loss:  100 ml  Specimens:   Sentinel lymph node #1 -- hot and blue, count 4123 Sentinel lymph node #2 -- hot and blue, count 266 Sentinel lymph node #3 -- hot and blue, count 372 Right breast, short stitch superior, long stitch lateral Right breast new anterior margin, ink marks deep margin.  Complications:  None  Operation performed with curative intent:Yes  Tracer(s) used to identify sentinel nodes in the upfront surgery (non-neoadjuvant) setting (select all that apply):Dye and Radioactive Tracer  Tracer(s) used to identify sentinel nodes in the neoadjuvant setting (select all that apply):N/A  All nodes (colored or non-colored) present at the end of a dye-filled lymphatic channel were removed:Yes   All significantly radioactive nodes were removed:Yes  All palpable suspicious nodes were removed:Yes  Biopsy-proven positive nodes marked with clips prior to chemotherapy were identified and removed:N/A  Indications for Procedure:  This is a 66 y.o. female who presents with bilateral breast cancer.  She has a history of bilateral breast implants 30+ years ago, and she has opted for bilateral mastectomy and reconstruction.  The risks of bleeding, infection, injury to surrounding structures, hematoma, seroma, open wound, cosmetic deformity, and the need for further surgery were all discussed with the patient and was willing to proceed.  Prior to this procedure, the patient had undergone  sentinel lymphoscintigraphy.  Description of Procedure: The patient was correctly identified in the preoperative area and brought into the operating room.  The patient was placed supine with VTE prophylaxis in place.  Appropriate time-outs were performed.  Anesthesia was induced and the patient was intubated.  Appropriate antibiotics were infused.  A visual dye was injected in the bilateral periareolar region under aseptic conditions. The bilateral chest and axilla were prepped and draped in usual sterile fashion.  Dr. Caroleen Hamman performed the left sentinel lymph node biopsy and left mastectomy.  Please see his op note for further details.  I started on the right side axillary lymph node biopsy.  Using the hand-held probe an area of high counts was identified in the right axilla, and a 5 cm incision was made.  Cautery was used to dissect down the subcutaneous tissue and the hand-held probe was used to guide dissection. A hot and blue lymph node was identified and resected.  This had a count of 4123.  Additional lymph nodes were identified and resected with counts of 266 and 372.  The cavity was irrigated and hemostasis was assured with electrocautery.  Local anesthetic was infiltrated into the skin and subcutaneous tissue of the cavity.  The wound was then closed in two layers with 3-0 Vicryl and 4-0 Monocryl.  An elliptical incision was then made over the breast encompassing the nipple-areolar complex.  Using electrocautery, subcutaneous flaps were created superiorly to the clavicle, inferiorly to the inframammary fold, medially to the sternum, and laterally to the latissimus dorsi with careful attention to create flaps of adequate thickness.  There was significant tightness due to the patient's implant and it was difficult to get in the right tissue plane at times.  The breast tissue was then dissected off the pectoralis fascia as the deep margin.  2-0 silk suture was used to mark the specimen as short  superior and long lateral.  The cavity was then irrigated and hemostasis was assured with electrocautery.  There was one area anteriorly at the inferior skin flap that felt hard, in correlation to one of the areas of cancer in the right breast.  As a precaution, this area was inked with marking pen and shaved off the skin flap, and sent to pathology.  Dr. Marla Roe then continued with the bilateral breast reconstruction.  Please see her operative note for further details.  I assisted her with exposure, dissection, placement and filling of tissue expander, and closure of the right breast.  Once completed, the patient was emerged from anesthesia and extubated and brought to the recovery room for further management.  The patient tolerated the procedure well and all counts were correct at the end of the case.   Melvyn Neth, MD

## 2022-07-26 NOTE — Interval H&P Note (Signed)
History and Physical Interval Note:  07/26/2022 11:34 AM  Marie Nguyen  has presented today for surgery, with the diagnosis of Bilateral breast cancer.  The various methods of treatment have been discussed with the patient and family. After consideration of risks, benefits and other options for treatment, the patient has consented to  Procedure(s): SIMPLE MASTECTOMY, Otho Ket, PA-C to assist (Bilateral) AXILLARY SENTINEL NODE BIOPSY (Bilateral) BREAST RECONSTRUCTION WITH PLACEMENT OF TISSUE EXPANDER AND ALLODERM (Bilateral) as a surgical intervention.  The patient's history has been reviewed, patient examined, no change in status, stable for surgery.  I have reviewed the patient's chart and labs.  Questions were answered to the patient's satisfaction.     Loel Lofty Melvena Vink

## 2022-07-26 NOTE — Anesthesia Preprocedure Evaluation (Addendum)
Anesthesia Evaluation  Patient identified by MRN, date of birth, ID band Patient awake    Reviewed: Allergy & Precautions, NPO status , Patient's Chart, lab work & pertinent test results  History of Anesthesia Complications Negative for: history of anesthetic complications  Airway Mallampati: II  TM Distance: >3 FB Neck ROM: full    Dental  (+) Teeth Intact   Pulmonary neg pulmonary ROS   Pulmonary exam normal        Cardiovascular Exercise Tolerance: Good hypertension, On Medications Normal cardiovascular exam+ Valvular Problems/Murmurs      Neuro/Psych negative neurological ROS  negative psych ROS   GI/Hepatic Neg liver ROS,GERD  ,,  Endo/Other  negative endocrine ROS    Renal/GU      Musculoskeletal  (+) Arthritis ,    Abdominal   Peds  Hematology negative hematology ROS (+)   Anesthesia Other Findings Past Medical History: No date: Allergy No date: Arthritis No date: Eczema No date: GERD (gastroesophageal reflux disease) No date: Heart murmur No date: Hypertension  Past Surgical History: No date: ABDOMINAL HYSTERECTOMY No date: AUGMENTATION MAMMAPLASTY No date: BREAST BIOPSY; Right     Comment:  neg 05/16/2022: BREAST BIOPSY; Right     Comment:  x 2 areas/ [place] bx & Stereo bx coil clip/path pending 05/16/2022: BREAST BIOPSY; Left     Comment:  [place] bx/ path pending No date: CESAREAN SECTION 11/24/2018: COLONOSCOPY WITH PROPOFOL; N/A     Comment:  Procedure: COLONOSCOPY WITH PROPOFOL;  Surgeon: Jonathon Bellows, MD;  Location: [facility] ENDOSCOPY;  Service:               Gastroenterology;  Laterality: N/A; 07/03/2021: COLONOSCOPY WITH PROPOFOL; N/A     Comment:  Procedure: COLONOSCOPY WITH PROPOFOL;  Surgeon: Jonathon Bellows, MD;  Location: [facility] ENDOSCOPY;  Service:               Gastroenterology;  Laterality: N/A; No date: GANGLION CYST EXCISION; Right     Comment:  wrist No date:  TOTAL ABDOMINAL HYSTERECTOMY     Reproductive/Obstetrics negative OB ROS                             Anesthesia Physical Anesthesia Plan  ASA: 2  Anesthesia Plan: General ETT   Post-op Pain Management: Tylenol PO (pre-op)*, Gabapentin PO (pre-op)* and Toradol IV (intra-op)*   Induction: Intravenous  PONV Risk Score and Plan: 4 or greater and Ondansetron, Dexamethasone, Midazolam and Treatment may vary due to age or medical condition  Airway Management Planned: Oral ETT  Additional Equipment:   Intra-op Plan:   Post-operative Plan: Extubation in OR  Informed Consent: I have reviewed the patients History and Physical, chart, labs and discussed the procedure including the risks, benefits and alternatives for the proposed anesthesia with the patient or authorized representative who has indicated his/her understanding and acceptance.     Dental Advisory Given  Plan Discussed with: Anesthesiologist, CRNA and Surgeon  Anesthesia Plan Comments: (Patient consented for risks of anesthesia including but not limited to:  - adverse reactions to medications - damage to eyes, teeth, lips or other oral mucosa - nerve damage due to positioning  - sore throat or hoarseness - Damage to heart, brain, nerves, lungs, other parts of body or loss of life  Patient  voiced understanding.)        Anesthesia Quick Evaluation

## 2022-07-26 NOTE — Discharge Instructions (Signed)

## 2022-07-26 NOTE — Op Note (Signed)
Op report    DATE OF OPERATION:  07/26/2022  LOCATION: [facility]   SURGICAL DIVISION: Plastic Surgery  PREOPERATIVE DIAGNOSES:  Bilateral Breast cancer.    POSTOPERATIVE DIAGNOSES:  Bilateral Breast cancer.   PROCEDURE:  1. Bilateral immediate breast reconstruction with placement of Acellular Dermal Matrix and tissue expanders.  SURGEON: Yehudit Fulginiti Sanger Roddy Bellamy, DO  ANESTHESIA:  General.   COMPLICATIONS: None.   IMPLANTS: Left - Mentor 455 cc. Ref #[id number].  Serial Number [id number], 250 cc of injectable saline placed in the expander. Right - Mentor 455 cc. Ref #[id number].  Serial Number [id number], 250 cc of injectable saline placed in the expander. Acellular Dermal Matrix Flex HD 8 x 16 cm  INDICATIONS FOR PROCEDURE:  The patient, Marie Nguyen, is a 66 y.o. female born on 12-May-1956, is here for  immediate first stage breast reconstruction with placement of bilateral tissue expander and Acellular dermal matrix. MRN: [id number]  CONSENT:  Informed consent was obtained directly from the patient. Risks, benefits and alternatives were fully discussed. Specific risks including but not limited to bleeding, infection, hematoma, seroma, scarring, pain, implant infection, implant extrusion, capsular contracture, asymmetry, wound healing problems, and need for further surgery were all discussed. The patient did have an ample opportunity to have her questions answered to her satisfaction.   DESCRIPTION OF PROCEDURE:  The patient was taken to the operating room by the general surgery team. SCDs were placed and IV antibiotics were given. The patient's chest was prepped and draped in a sterile fashion. A time out was performed and the implants to be used were identified.  Bilateral mastectomies were performed.  Once the general surgery team had completed their portion of the case the patient was rendered to the plastic and reconstructive surgery team.  Right:  The  pocket under the pectoralis major muscle was opened.  The implant was removed.  It was ruptured and the pocket was full of thick brown colored material and fluid.   The capsule was very calcified and the decision was made to remove it completely due to the diagnosis and the concern that it would interfere with the reconstruction.  The pocket was irrigated with antibiotic solution and hemostasis was achieved with electrocautery.  Arista was placed in the pocket. The ADM was then prepared according to the manufacture guidelines and slits placed to help with postoperative fluid management.  The ADM was then sutured to the inferior and lateral edge of the inframammary fold with 2-0 PDS starting with an interrupted stitch and then a running stitch.  The lateral portion was sutured to with interrupted sutures after the expander was placed.  The expander was prepared according to the manufacture guidelines, the air evacuated and then it was placed under the ADM and pectoralis major muscle.  The inferior and lateral tabs were used to secure the expander to the chest wall with 2-0 PDS.  The drain was placed at the inframammary fold over the ADM and secured to the skin with 3-0 Silk. An additional biopsy was take from the middle of the lower flap at the edge and marked with ink on the deep margin.  The deep layers were closed with 3-0 PDS followed by 3-0 Monocryl.  The skin was closed with 4-0 Monocryl and then dermabond was applied.   Left: The pocket under the pectoralis major muscle was opened.  The implant was removed.  It was a Siltex 671 cc silicon implant and was ruptured.   The capsule was very calcified  and the decision was made to remove it completely due to the diagnosis and the concern that it would interfere with the reconstruction.  The pocket was irrigated with antibiotic solution and hemostasis was achieved with electrocautery.  Arista was placed in the pocket.  The ADM was then prepared according to the  manufacture guidelines and slits placed to help with postoperative fluid management.  The ADM was then sutured to the inferior and lateral edge of the inframammary fold with 2-0 PDS starting with an interrupted stitch and then a running stitch.  The lateral portion was sutured to with interrupted sutures after the expander was placed.  The expander was prepared according to the manufacture guidelines, the air evacuated and then it was placed under the ADM and pectoralis major muscle.  The inferior and lateral tabs were used to secure the expander to the chest wall with 2-0 PDS.  The drain was placed over the ADM and secured to the skin with 4-0 Silk.  The deep layers were closed with 3-0 PDS followed by 4-0 Monocryl.  Dermabond was applied.  The ABDs and breast binder were placed.  The patient tolerated the procedure well and there were no complications.  The patient was allowed to wake from anesthesia and taken to the recovery room in satisfactory condition.   Dr. Hampton Abbot assisted throughout the case.  Dr. Hampton Abbot was essential in retraction and counter traction when needed to make the case progress smoothly.  This retraction and assistance made it possible to see the tissue plans for the procedure.  The assistance was needed for blood control, tissue re-approximation and assisted with closure of the incision site.

## 2022-07-26 NOTE — Transfer of Care (Signed)
Immediate Anesthesia Transfer of Care Note  Patient: Marie Nguyen  Procedure(s) Performed: SIMPLE MASTECTOMY, Otho Ket, PA-C to assist (Bilateral) AXILLARY SENTINEL NODE BIOPSY (Bilateral) BREAST RECONSTRUCTION WITH PLACEMENT OF TISSUE EXPANDER AND ALLODERM (Bilateral)  Patient Location: PACU  Anesthesia Type:General  Level of Consciousness: awake, alert , and oriented  Airway & Oxygen Therapy: Patient Spontanous Breathing and Patient connected to face mask oxygen  Post-op Assessment: Report given to RN and Post -op Vital signs reviewed and stable  Post vital signs: Reviewed and stable  Last Vitals:  Vitals Value Taken Time  BP 144/86 07/26/22 1656  Temp    Pulse 99 07/26/22 1659  Resp 14 07/26/22 1659  SpO2 99 % 07/26/22 1659  Vitals shown include unvalidated device data.  Last Pain:  Vitals:   07/26/22 0855  TempSrc: Temporal  PainSc: 1          Complications: No notable events documented.

## 2022-07-27 ENCOUNTER — Encounter: Payer: Self-pay | Admitting: Surgery

## 2022-07-27 DIAGNOSIS — C50811 Malignant neoplasm of overlapping sites of right female breast: Secondary | ICD-10-CM | POA: Diagnosis present

## 2022-07-27 DIAGNOSIS — Z8249 Family history of ischemic heart disease and other diseases of the circulatory system: Secondary | ICD-10-CM | POA: Diagnosis not present

## 2022-07-27 DIAGNOSIS — Z803 Family history of malignant neoplasm of breast: Secondary | ICD-10-CM | POA: Diagnosis not present

## 2022-07-27 DIAGNOSIS — Z79899 Other long term (current) drug therapy: Secondary | ICD-10-CM | POA: Diagnosis not present

## 2022-07-27 DIAGNOSIS — Z170 Estrogen receptor positive status [ER+]: Secondary | ICD-10-CM | POA: Diagnosis not present

## 2022-07-27 DIAGNOSIS — Z823 Family history of stroke: Secondary | ICD-10-CM | POA: Diagnosis not present

## 2022-07-27 DIAGNOSIS — Z800 Family history of malignant neoplasm of digestive organs: Secondary | ICD-10-CM | POA: Diagnosis not present

## 2022-07-27 DIAGNOSIS — C50912 Malignant neoplasm of unspecified site of left female breast: Secondary | ICD-10-CM | POA: Diagnosis present

## 2022-07-27 DIAGNOSIS — C50911 Malignant neoplasm of unspecified site of right female breast: Secondary | ICD-10-CM | POA: Diagnosis present

## 2022-07-27 DIAGNOSIS — Z833 Family history of diabetes mellitus: Secondary | ICD-10-CM | POA: Diagnosis not present

## 2022-07-27 DIAGNOSIS — Z9882 Breast implant status: Secondary | ICD-10-CM | POA: Diagnosis not present

## 2022-07-27 DIAGNOSIS — Z91040 Latex allergy status: Secondary | ICD-10-CM | POA: Diagnosis not present

## 2022-07-27 DIAGNOSIS — R110 Nausea: Secondary | ICD-10-CM | POA: Diagnosis present

## 2022-07-27 DIAGNOSIS — K219 Gastro-esophageal reflux disease without esophagitis: Secondary | ICD-10-CM | POA: Diagnosis present

## 2022-07-27 LAB — BASIC METABOLIC PANEL
Anion gap: 7 (ref 5–15)
BUN: 10 mg/dL (ref 8–23)
CO2: 26 mmol/L (ref 22–32)
Calcium: 8.8 mg/dL — ABNORMAL LOW (ref 8.9–10.3)
Chloride: 105 mmol/L (ref 98–111)
Creatinine, Ser: 0.67 mg/dL (ref 0.44–1.00)
GFR, Estimated: >60 mL/min (ref >60)
Glucose, Bld: 109 mg/dL — ABNORMAL HIGH (ref 70–99)
Potassium: 3.9 mmol/L (ref 3.5–5.1)
Sodium: 138 mmol/L (ref 135–145)

## 2022-07-27 LAB — CBC
HCT: 34.4 % — ABNORMAL LOW (ref 36.0–46.0)
Hemoglobin: 11.3 g/dL — ABNORMAL LOW (ref 12.0–15.0)
MCH: 31.7 pg (ref 26.0–34.0)
MCHC: 32.8 g/dL (ref 30.0–36.0)
MCV: 96.4 fL (ref 80.0–100.0)
Platelets: 215 10*3/uL (ref 150–400)
RBC: 3.57 MIL/uL — ABNORMAL LOW (ref 3.87–5.11)
RDW: 12.7 % (ref 11.5–15.5)
WBC: 7.8 10*3/uL (ref 4.0–10.5)
nRBC: 0 % (ref 0.0–0.2)

## 2022-07-27 LAB — HIV ANTIBODY (ROUTINE TESTING W REFLEX): HIV Screen 4th Generation wRfx: NONREACTIVE

## 2022-07-27 LAB — MAGNESIUM: Magnesium: 1.8 mg/dL (ref 1.7–2.4)

## 2022-07-27 MED ORDER — TRAMADOL HCL 50 MG PO TABS: 50.0000 mg | ORAL_TABLET | Freq: Four times a day (QID) | ORAL | 0 refills | Status: DC | PRN

## 2022-07-27 MED ORDER — TRAMADOL HCL 50 MG PO TABS
50.0000 mg | ORAL_TABLET | Freq: Four times a day (QID) | ORAL | Status: DC | PRN
  Administered 2022-07-27 – 2022-07-28 (×4): 50 mg via ORAL
  Filled 2022-07-27 (×4): qty 1

## 2022-07-27 MED ORDER — MELATONIN 5 MG PO TABS
5.0000 mg | ORAL_TABLET | Freq: Every day | ORAL | Status: DC
  Administered 2022-07-27: 5 mg via ORAL
  Filled 2022-07-27: qty 1

## 2022-07-27 NOTE — Progress Notes (Addendum)
[facility] Day(s): 0.   Post op day(s): 1 Day Post-Op.   Interval History:  Patient seen and examined No acute events or new complaints overnight.  Patient reports she has significant soreness Some nausea this morning after taking pain medications No fever, chills, emesis She is without leukocytosis; 7.8K Hgb stable at 11.3 Renal function normal; sCr - 0.67; UO - unmeasured No electrolyte derangements  Drain outputs are as follows:  - Right drain - 310 ccs; sanguinous but appears to be thinning, no clots - Left drain - 115 ccs; serosanguinous  She is on CLD; has not advanced yet    Vital signs in last 24 hours: [min-max] current  Temp:  [97.5 F (36.4 C)-98.7 F (37.1 C)] 98 F (36.7 C) (11/03 0725) Pulse Rate:  [62-97] 62 (11/03 0725) Resp:  [11-22] 16 (11/03 0725) BP: (88-144)/(52-89) 88/52 (11/03 0725) SpO2:  [94 %-100 %] 99 % (11/03 0725)     Height: '5\' 7"'$  (170.2 cm) Weight: 63.5 kg BMI (Calculated): 21.92   Intake/Output last 2 shifts:  11/02 0701 - 11/03 0700 In: 1231.2 [I.V.:1001.2; IV Piggyback:230] Out: 525 [Drains:425; Blood:100]   Physical Exam:  Constitutional: alert, cooperative and no distress  Respiratory: breathing non-labored at rest  Cardiovascular: regular rate and sinus rhythm  Integumentary: Bilateral mastectomy incisions are CDI with dermabond, steri-strips, and honey comb dressing. There is no appreciable drainage. Sh is tender bilateral. Right > left mild swelling. Bilateral axilla incisions are CDI with dermabond. There are bilateral drains as well. The left out is serosanguinous. The right has more sanguinous fluid but appears to be thinning. The tubing was stripped without evidence of clot.   Labs:     Latest Ref Rng & Units 07/27/2022    4:22 AM 07/18/2022    9:55 AM 03/13/2022    9:34 AM  CBC  WBC 4.0 - 10.5 K/uL 7.8  5.5  7.1   Hemoglobin 12.0 - 15.0 g/dL 11.3  14.0  14.3   Hematocrit  36.0 - 46.0 % 34.4  42.5  39.4   Platelets 150 - 400 K/uL 215  261  231       Latest Ref Rng & Units 07/27/2022    4:22 AM 07/18/2022    9:55 AM 12/22/2021    9:19 AM  CMP  Glucose 70 - 99 mg/dL 109  104  98   BUN 8 - 23 mg/dL '10  19  15   '$ Creatinine 0.44 - 1.00 mg/dL 0.67  0.74  0.69   Sodium 135 - 145 mmol/L 138  142  144   Potassium 3.5 - 5.1 mmol/L 3.9  4.2  4.2   Chloride 98 - 111 mmol/L 105  106  102   CO2 22 - 32 mmol/L '26  28  23   '$ Calcium 8.9 - 10.3 mg/dL 8.8  9.7  10.1     Imaging studies: No new pertinent imaging studies   Assessment/Plan: 66 y.o. female  1 Day Post-Op s/p bilateral simple mastectomies with SLNB and immediate bilateral reconstruction with placement of Acellular Dermal Matrix and tissue expanders.     - Okay to advance diet as tolerated   - Wean from IVF as diet advances  - Continue drains x2; monitor and record output; strip as needed  - Monitor H&H; stable; no overt evidence of bleeding hematoma at this time - Continue breast binder for support    - Pain control prn; switched to tramadol given nausea  with oxycodone this AM - Antiemetics prn - Mobilize    - Discharge Planning; Will keep today to monitor, repeat CBC in AM. Anticipate discharge tomorrow (11/04) if clinically doing well. I will prep discharge information today.   All of the above findings and recommendations were discussed with the patient, patient's family at bedside, and the medical team, and all of their questions were answered to their expressed satisfaction.  -- Edison Simon, PA-C  [facility] 07/27/2022, 9:16 AM M-F: 7am - 4pm

## 2022-07-28 ENCOUNTER — Encounter: Payer: Self-pay | Admitting: Plastic Surgery

## 2022-07-28 LAB — CBC
HCT: 32.7 % — ABNORMAL LOW (ref 36.0–46.0)
Hemoglobin: 10.7 g/dL — ABNORMAL LOW (ref 12.0–15.0)
MCH: 32.3 pg (ref 26.0–34.0)
MCHC: 32.7 g/dL (ref 30.0–36.0)
MCV: 98.8 fL (ref 80.0–100.0)
Platelets: 202 10*3/uL (ref 150–400)
RBC: 3.31 MIL/uL — ABNORMAL LOW (ref 3.87–5.11)
RDW: 13.3 % (ref 11.5–15.5)
WBC: 5.4 10*3/uL (ref 4.0–10.5)
nRBC: 0 % (ref 0.0–0.2)

## 2022-07-28 NOTE — Discharge Summary (Signed)
Patient ID: Marie Nguyen MRN: [id number] DOB/AGE: May 23, 1956 66 y.o.  Admit date: 07/26/2022 Discharge date: 07/28/2022   Discharge Diagnoses:  Principal Problem:   Bilateral breast cancer [facility])   Procedures: Bilateral mastectomy with sentinel lymph node biopsy and immediate reconstruction  Hospital Course: Patient admitted for the treatment of breast cancer.  She underwent bilateral mastectomy with sentinel lymph node biopsy and immediate reconstruction.  On postoperative day #1 patient was nauseous with high output through the drain.  He was treated symptomatically and patient today feels much comfortable.  She is not nauseous.  She is tolerating diet.  The pain is controlled.  Improved output through the drain.  The wounds are dry and clean.  The flaps are healthy.  Physical Exam Vitals reviewed.  HENT:     Head: Normocephalic.  Cardiovascular:     Rate and Rhythm: Normal rate and regular rhythm.  Pulmonary:     Effort: Pulmonary effort is normal.     Breath sounds: Normal breath sounds.  Chest:  Breasts:    Right: Absent.     Left: Absent.     Comments: With healthy skin flaps.  The wounds are dry and clean.  No ischemic tissue. Abdominal:     General: Abdomen is flat.  Musculoskeletal:     Cervical back: Normal range of motion.  Skin:    General: Skin is warm.     Capillary Refill: Capillary refill takes less than 2 seconds.  Neurological:     General: No focal deficit present.     Mental Status: She is alert and oriented to person, place, and time.      Consults: None  Disposition: Discharge disposition: 01-Home or Self Care       Discharge Instructions     Call MD for:  difficulty breathing, headache or visual disturbances   Complete by: As directed    Call MD for:  persistant nausea and vomiting   Complete by: As directed    Call MD for:  redness, tenderness, or signs of infection (pain, swelling, redness, odor or green/yellow discharge around  incision site)   Complete by: As directed    Call MD for:  severe uncontrolled pain   Complete by: As directed    Call MD for:  temperature >100.4   Complete by: As directed    Diet - low sodium heart healthy   Complete by: As directed    Diet general   Complete by: As directed    Regular diet as tolerated   Discharge instructions   Complete by: As directed    1.  Patient may shower, but do not scrub wounds heavily and dab dry only. 2.  Do not submerge wounds in pool/tub until fully healed. 3.  Do not apply ointments or hydrogen peroxide to the wounds. 4.  May apply ice packs to the wounds for comfort. 5.  Please empty and record drain output twice daily and as needed to only allow bulb to get half full.  Bring drain record with you to office appointment. 6.  Please wear breast binder at all times for 2 weeks.  May remove for showers. 7.  Please take the antibiotic prescription as prescribed by Plastic Surgery team on your preop visit.   Driving Restrictions   Complete by: As directed    Do not drive while taking narcotics for pain control.  Prior to driving, make sure you are able to rotate right and left to look at blindspots without significant  pain or discomfort.   Increase activity slowly   Complete by: As directed    Increase activity slowly   Complete by: As directed    Lifting restrictions   Complete by: As directed    No heavy lifting or pushing of more than 10-15 lbs for 4 weeks.      Allergies as of 07/28/2022       Reactions   Latex Rash        Medication List     TAKE these medications    acetaminophen 500 MG tablet Commonly known as: TYLENOL Take 1,000 mg by mouth every 6 (six) hours as needed for mild pain.   azelastine 0.1 % nasal spray Commonly known as: ASTELIN Place 1 spray into both nostrils daily. Use in each nostril as directed   diazepam 2 MG tablet Commonly known as: Valium Take 1 tablet (2 mg total) by mouth every 12 (twelve) hours as  needed for muscle spasms.   Fish Oil 1000 MG Caps Take 1 capsule by mouth as needed.   fluticasone 50 MCG/ACT nasal spray Commonly known as: FLONASE Place 1 spray into both nostrils daily as needed for allergies or rhinitis.   ibuprofen 200 MG tablet Commonly known as: ADVIL Take 200 mg by mouth every 6 (six) hours as needed for mild pain. 2 - 3 tabs   lidocaine-prilocaine cream Commonly known as: EMLA Apply to the areola of both breasts and cover with plastic wrap one hour prior to leaving for surgery.   lisinopril-hydrochlorothiazide 10-12.5 MG tablet Commonly known as: ZESTORETIC Take 1 tablet by mouth daily.   multivitamin tablet Take 1 tablet by mouth daily.   omeprazole 20 MG capsule Commonly known as: PRILOSEC Take 20 mg by mouth daily.   ondansetron 4 MG tablet Commonly known as: Zofran Take 1 tablet (4 mg total) by mouth every 8 (eight) hours as needed for nausea or vomiting.   traMADol 50 MG tablet Commonly known as: Ultram Take 1 tablet (50 mg total) by mouth every 6 (six) hours as needed.   triamcinolone cream 0.1 % Commonly known as: KENALOG 1 Application as needed.        Follow-up Information     Piscoya, Jacqulyn Bath, MD. Schedule an appointment as soon as possible for a visit in 2 week(s).   Specialty: General Surgery Why: s/p bilateral mastectomy Contact information: [address] [place] [address] [phone]         Corena Herter, PA-C Follow up on 08/03/2022.   Specialties: Plastic Surgery, Emergency Medicine Why: s/p bilateral breast reconstruction, has drains. Contact information: [address] [place] [address] [phone]                This discharge encounter was for 35-minute most of the time counseling the patient and coordinating the plan of care.

## 2022-07-30 ENCOUNTER — Telehealth: Payer: Self-pay | Admitting: *Deleted

## 2022-07-30 ENCOUNTER — Telehealth: Payer: Self-pay | Admitting: Surgery

## 2022-07-30 MED ORDER — CEPHALEXIN 500 MG PO CAPS: 500.0000 mg | ORAL_CAPSULE | Freq: Four times a day (QID) | ORAL | 0 refills | Status: AC

## 2022-07-30 NOTE — Telephone Encounter (Signed)
It looks like she had Keflex prescribed at her pre-op appointment. I would let her know it should have been at the pharmacy along with her other medications

## 2022-07-30 NOTE — Telephone Encounter (Signed)
Rx for 4 days of keflex sent to pt pharmacy for her to take post-operatively.

## 2022-07-30 NOTE — Telephone Encounter (Signed)
Patient had already spoken with Plastic surgery and all questions were answered.

## 2022-07-30 NOTE — Telephone Encounter (Signed)
Pt's daughter called (on speaker phone with patient present) to check on post-operative antibiotics. Pt states she took the ones prescribed 5 days before surgery. Discussed with Verdie Shire, PA. He is sending in Rx for additional antibiotics. Pt and daughter aware.

## 2022-07-30 NOTE — Telephone Encounter (Signed)
Patient had bilateral mastectomy and reconstruction done on 07/26/22 Dr. Hampton Abbot.  Patient is now scheduled for her post op.  But does have a few concerns.  On the right drain now showing at aboutl 70 ml and still a very dark red, wants to know if this is normal.  Also on the left side lymph node area is extremely sore and wants to know if this is also normal?  Other than that she is doing well.  Just wants the above addressed so she does not worry.  Please call her. Thank you.

## 2022-08-02 LAB — SURGICAL PATHOLOGY

## 2022-08-02 NOTE — Progress Notes (Signed)
Patient is a very pleasant 66 year old female with PMH of breast cancer s/p bilateral mastectomy with immediate reconstruction using tissue expander and AlloDerm performed 07/26/2022 Dr. Marla Roe who presents to clinic for postoperative follow-up.  Reviewed operative note and 250 cc was placed into each of the 455 cc expanders bilaterally.  Today, patient is accompanied by her sister, Karna Christmas.  She tells me that she has done well from a postoperative pain standpoint.  She complains of bilateral axillary discomfort as well as drain tube insertion site pain, left greater than right.  Patient has been taking her tramadol with good effect, but believes that she will be able to transition to Tylenol and NSAIDs alone at this point.  She denies any leg swelling and states that she has been ambulating well.  Denies any difficulty eating or drinking.  No constipation issues.  Denies any fevers.  Physical exam is entirely reassuring.  Expanders in place.  No obvious seroma or hematoma.  Some old ecchymoses, resolving.  Symmetric.  No obvious hematoma or seroma.  Honeycomb dressings are removed.  Steri-Strips remain firmly intact.  JP drain insertion sites without any considerable irritation or redness.  They are intact and functional.  Normal-appearing drainage in bulbs bilaterally.  She has only had approximately 25 cc/day drainage from the left side and 15 cc/day drainage from the right side for the past couple of days.  However, she understands that they are to be left in until next week.  We will reassess at that time and likely perform expander fill at that time, as well.  She seems to be doing well postoperatively.  Picture(s) obtained of the patient and placed in the chart were with the patient's or guardian's permission.  Patient's vital signs for today's encounter are as follows: BP 125/68 (BP Location: Left Arm, Patient Position: Sitting, Cuff Size: Normal)   Pulse (!) 46   SpO2 97%

## 2022-08-03 ENCOUNTER — Other Ambulatory Visit: Payer: Self-pay | Admitting: Surgery

## 2022-08-03 ENCOUNTER — Encounter: Payer: Self-pay | Admitting: Physician Assistant

## 2022-08-03 ENCOUNTER — Encounter: Payer: Self-pay | Admitting: *Deleted

## 2022-08-03 ENCOUNTER — Ambulatory Visit (INDEPENDENT_AMBULATORY_CARE_PROVIDER_SITE_OTHER): Payer: BC Managed Care – PPO | Admitting: Physician Assistant

## 2022-08-03 ENCOUNTER — Encounter: Payer: BC Managed Care – PPO | Admitting: Physician Assistant

## 2022-08-03 VITALS — BP 125/68 | HR 46

## 2022-08-03 DIAGNOSIS — Z9889 Other specified postprocedural states: Secondary | ICD-10-CM

## 2022-08-03 NOTE — Progress Notes (Signed)
Oncotype Dx order submitted online on both breast specimens [phone], order ID [id number].

## 2022-08-07 ENCOUNTER — Telehealth: Payer: Self-pay

## 2022-08-07 MED ORDER — TRAMADOL HCL 50 MG PO TABS: 50.0000 mg | ORAL_TABLET | Freq: Four times a day (QID) | ORAL | 0 refills | Status: DC | PRN

## 2022-08-07 NOTE — Telephone Encounter (Signed)
MEDICATION: traMADol (ULTRAM) 50 MG tablet    PHARMACY: [facility] - [place] - [address]  Comments: Sister said Marie Nguyen is still in some discomfort and asked if we would refill her medication. Has two pills left.

## 2022-08-07 NOTE — Addendum Note (Signed)
Addended byPenni Bombard, Jauna Raczynski on: 08/07/2022 04:12 PM   Modules accepted: Orders

## 2022-08-07 NOTE — Telephone Encounter (Signed)
I spoke with Ms. Donahoe, she is still having some discomfort that does not resolve with Tylenol.  I do think it is reasonable to give her 1 more refill of the tramadol, she understands that we will not continue providing refills.  Otherwise she is doing well.  We will see her in the office soon.

## 2022-08-09 NOTE — Progress Notes (Signed)
Patient is a very pleasant 66 year old female with PMH of breast cancer s/p bilateral mastectomy with immediate reconstruction using tissue expander and AlloDerm performed 07/26/2022 Dr. Marla Roe who presents to clinic for postoperative follow-up.  Reviewed operative note and 250 cc was placed into each of the 455 cc expanders bilaterally.   She was last seen here in clinic on 08/03/2022.  At that time, exam was entirely reassuring.  She had not been experiencing any considerable drain output, but decision was made to leave them in place for an additional week.  She was doing well from a postoperative standpoint.  Today, patient is doing okay.  She is accompanied by her sister at bedside.  She denies any fevers or leg swelling.  She states that her breasts are tender and she experiences intermittent discomfort.  Drain output has still remained minimal from each side.  She is hopeful that they can be removed today.  On physical exam, expanders are appropriately positioned and good symmetry is noted.  Steri-Strips still remain firmly intact.  Normal-appearing dark serosanguineous drainage in bulbs bilaterally.  Intact and functional.  No overlying erythema.  Some old ecchymoses noted.  Tenderness around drain sites.    JP drains removed.  Afterwards, there was a steady amount of serosanguineous drainage for which gauze was applied repeatedly until it slowed.  Suspect that there was a small seroma bilaterally.  Did not appear to be cloudy or purulent.  No malodor.  No surrounding skin changes at drain insertion site.  Given possible small seromas, discussed with patient preference for doing slight expansion today.  We placed injectable saline in the Expander using a sterile technique: Right: 50 cc for a total of 300 / 455 cc Left: 50 cc for a total of 300 / 455 cc  Patient tells me that she does not want to be too large.  Skin was certainly amenable to expander fill.  We will plan to connect with her  next week before Thanksgiving, but assuming she is doing well we can hold off on additional visit and expander fill until the Monday following the holiday.  Picture(s) obtained of the patient and placed in the chart were with the patient's or guardian's permission.

## 2022-08-10 ENCOUNTER — Other Ambulatory Visit: Payer: Self-pay

## 2022-08-10 ENCOUNTER — Ambulatory Visit (INDEPENDENT_AMBULATORY_CARE_PROVIDER_SITE_OTHER): Payer: BC Managed Care – PPO | Admitting: Surgery

## 2022-08-10 ENCOUNTER — Encounter: Payer: Self-pay | Admitting: Surgery

## 2022-08-10 ENCOUNTER — Ambulatory Visit (INDEPENDENT_AMBULATORY_CARE_PROVIDER_SITE_OTHER): Payer: BC Managed Care – PPO | Admitting: Physician Assistant

## 2022-08-10 VITALS — BP 131/82 | HR 68 | Temp 98.2°F | Ht 67.0 in | Wt 135.0 lb

## 2022-08-10 VITALS — BP 124/77 | HR 76 | Temp 98.0°F | Resp 18

## 2022-08-10 DIAGNOSIS — Z9889 Other specified postprocedural states: Secondary | ICD-10-CM

## 2022-08-10 DIAGNOSIS — Z170 Estrogen receptor positive status [ER+]: Secondary | ICD-10-CM

## 2022-08-10 DIAGNOSIS — C50412 Malignant neoplasm of upper-outer quadrant of left female breast: Secondary | ICD-10-CM

## 2022-08-10 DIAGNOSIS — C50811 Malignant neoplasm of overlapping sites of right female breast: Secondary | ICD-10-CM

## 2022-08-10 DIAGNOSIS — Z09 Encounter for follow-up examination after completed treatment for conditions other than malignant neoplasm: Secondary | ICD-10-CM

## 2022-08-10 NOTE — Patient Instructions (Addendum)
Please call with any questions or concerns. WE will contact you April 2024 to schedule an appointment for May 2024.

## 2022-08-10 NOTE — Progress Notes (Unsigned)
08/10/2022  HPI: Marie Nguyen is a 66 y.o. female s/p bilateral mastectomy with bilateral sentinel lymph node biopsy and bilateral tissue expander placement on 07/26/22.  She presents today for follow up.  Her drains are still in place, but the drain output is decreasing.  She reports more discomfort in the right breast compared to the left.  She has an appointment with [facility] Nguyen office later today.  Vital signs: BP 131/82   Pulse 68   Temp 98.2 F (36.8 C) (Oral)   Ht '5\' 7"'$  (1.702 m)   Wt 135 lb (61.2 kg)   SpO2 96%   BMI 21.14 kg/m    Physical Exam: Constitutional: No acute distress Breast:  Bilateral incisions are healing well, without any flap ischemia on either side, only mild ecchymosis.  No evidence of infection.  Bilateral drains with serosanguinous output.    Assessment/Plan: This is a 66 y.o. female s/p bilateral mastectomy with bilateral sentinel lymph node biopsy and bilateral tissue expander placement  --Patient is healing well, without any complications at this point.  Discussed that the right breast had more tightness and inflammation during surgery, so this could contribute to it having more discomfort afterwards.  She has an appointment at Marie Nguyen office today. --Discussed the pathology results again with her.  All margins were negative bilaterally, and all lymph nodes were negative bilaterally as well.  She has an appointment with Marie Nguyen next week to discuss this further. --Follow up with me in 6 months.   Marie Nguyen, [facility]

## 2022-08-13 ENCOUNTER — Encounter: Payer: Self-pay | Admitting: *Deleted

## 2022-08-13 NOTE — Progress Notes (Signed)
PA and clinicals faxed to [employer] [place] for oncotype Dx testing.

## 2022-08-13 NOTE — Progress Notes (Signed)
Follow up scheduled with Dr. Grayland Ormond tomorrow, oncotype dx results are not back yet.   Per Dr. Grayland Ormond, appt. Moved to next week.  Details given to Ms. Whitaker.

## 2022-08-14 ENCOUNTER — Inpatient Hospital Stay: Payer: BC Managed Care – PPO | Admitting: Oncology

## 2022-08-20 ENCOUNTER — Encounter: Payer: BC Managed Care – PPO | Admitting: Physician Assistant

## 2022-08-20 ENCOUNTER — Ambulatory Visit (INDEPENDENT_AMBULATORY_CARE_PROVIDER_SITE_OTHER): Payer: BC Managed Care – PPO | Admitting: Student

## 2022-08-20 VITALS — BP 144/84 | HR 72

## 2022-08-20 DIAGNOSIS — Z9889 Other specified postprocedural states: Secondary | ICD-10-CM

## 2022-08-20 NOTE — Progress Notes (Signed)
Patient is a 66 year old female with history of breast cancer.  She is status post bilateral mastectomy with immediate reconstruction using tissue expanders and AlloDerm performed by Dr. Marla Roe on 07/26/2022.  Patient had 455 cc expanders placed bilaterally.  Patient presents to the clinic today for postoperative follow-up.  Patient was last seen in the clinic on 08/10/2022.  At this visit, she was reported she was doing well.  On exam, expanders were noted to have good symmetry.  There was dark serosanguineous drainage noted in her drains.  The JP drains were removed.  There was a steady amount of serosanguineous drainage from the drain sites after the drains were removed.  Gauze was applied repeatedly until it slowed.  Expanders were filled bilaterally with 50 cc for a total of 300 cc / 455 cc in each expander.  Patient reported at this visit that she does not want to be too large.  Today, patient reports she is doing well.  She states she has a little bit of soreness where they took the lymph nodes to her left axillary region.  She denies any other issues at this time.  She denies any fevers or chills.  Patient reports that she does not want to be much bigger and would like one last small fill today.   Chaperone present on exam.  On exam, patient is sitting upright in no acute distress.  Expanders are in place and are fairly symmetric.  There is no overlying erythema to the skin.  There appears to be some faint residual ecchymosis.  Incisions appear to be intact with Steri-Strips.  Steri-Strips were removed to all incisions.  To the right breast, the incision to the right lateral breast and to the middle of the breast appear to be intact.  There appears to be some residual Dermabond noted to the incision to the middle of the breast.  There is no drainage noted.  To the left breast, the lateral incision appears to be intact.  To the incision to the middle of the breast, there is a suture protruding from  the skin with some surrounding irritation.  There is a small pinpoint wound that appears to be superficial.  The expander is not visualized.  There is no drainage from this.  The suture was trimmed and patient tolerated well.  Vaseline was placed over the area.  Drain sites appear to be healing well.  We placed injectable saline in the Expander using a sterile technique: Right: 25 cc for a total of 325 cc/455 cc Left: 25 cc for a total of 325 cc/455 cc   I discussed with the patient that she will need to closely monitor her incisions.  I discussed with the patient that she needs to monitor the small area where the suture was protruding from the skin.  I discussed with her that if this becomes larger, if it starts draining, or becomes red, she needs to let us know immediately.  Patient expressed understanding.  I discussed with the patient that she needs to apply Vaseline and gauze over the area to her left breast incision.  I discussed with the patient that she should apply Vaseline to all of her incisions as well.  Patient expressed understanding.  Patient to follow-up on Thursday for reevaluation.  I instructed the patient to call in the meantime if she has any questions or concerns.  Pictures were obtained of the patient and placed in the chart with the patient's or guardian's permission.

## 2022-08-22 ENCOUNTER — Encounter: Payer: Self-pay | Admitting: *Deleted

## 2022-08-22 ENCOUNTER — Inpatient Hospital Stay: Payer: BC Managed Care – PPO | Attending: Oncology | Admitting: Occupational Therapy

## 2022-08-22 ENCOUNTER — Encounter: Payer: Self-pay | Admitting: Oncology

## 2022-08-22 ENCOUNTER — Other Ambulatory Visit: Payer: Self-pay | Admitting: Family Medicine

## 2022-08-22 ENCOUNTER — Inpatient Hospital Stay (HOSPITAL_BASED_OUTPATIENT_CLINIC_OR_DEPARTMENT_OTHER): Payer: BC Managed Care – PPO | Admitting: Oncology

## 2022-08-22 VITALS — BP 132/97 | HR 70 | Temp 98.5°F | Resp 16 | Ht 67.0 in | Wt 136.6 lb

## 2022-08-22 DIAGNOSIS — Z79811 Long term (current) use of aromatase inhibitors: Secondary | ICD-10-CM | POA: Diagnosis not present

## 2022-08-22 DIAGNOSIS — C50912 Malignant neoplasm of unspecified site of left female breast: Secondary | ICD-10-CM | POA: Insufficient documentation

## 2022-08-22 DIAGNOSIS — C50911 Malignant neoplasm of unspecified site of right female breast: Secondary | ICD-10-CM | POA: Diagnosis not present

## 2022-08-22 DIAGNOSIS — Z170 Estrogen receptor positive status [ER+]: Secondary | ICD-10-CM | POA: Insufficient documentation

## 2022-08-22 DIAGNOSIS — C50811 Malignant neoplasm of overlapping sites of right female breast: Secondary | ICD-10-CM

## 2022-08-22 DIAGNOSIS — C50011 Malignant neoplasm of nipple and areola, right female breast: Secondary | ICD-10-CM | POA: Diagnosis not present

## 2022-08-22 DIAGNOSIS — M25611 Stiffness of right shoulder, not elsewhere classified: Secondary | ICD-10-CM

## 2022-08-22 DIAGNOSIS — M25612 Stiffness of left shoulder, not elsewhere classified: Secondary | ICD-10-CM

## 2022-08-22 DIAGNOSIS — I10 Essential (primary) hypertension: Secondary | ICD-10-CM

## 2022-08-22 MED ORDER — LETROZOLE 2.5 MG PO TABS: 2.5000 mg | ORAL_TABLET | Freq: Every day | ORAL | 3 refills | Status: DC

## 2022-08-22 NOTE — Therapy (Signed)
[facility] at [facility] [place], [place], [place], [address] Phone: [phone]   Fax:  [phone]  Occupational Therapy Screen  Patient Details  Name: Marie Nguyen MRN: [id number] Date of Birth: [birth year] No data recorded  Encounter Date: 08/22/2022   OT End of Session - 08/22/22 1214     Visit Number 0             Past Medical History:  Diagnosis Date   Allergy    Arthritis    Eczema    GERD (gastroesophageal reflux disease)    Heart murmur    Hypertension     Past Surgical History:  Procedure Laterality Date   ABDOMINAL HYSTERECTOMY     AUGMENTATION MAMMAPLASTY     AXILLARY SENTINEL NODE BIOPSY Bilateral 07/26/2022   Procedure: AXILLARY SENTINEL NODE BIOPSY;  Surgeon: Olean Ree, MD;  Location: [facility] ORS;  Service: General;  Laterality: Bilateral;   BREAST BIOPSY Right    neg   BREAST BIOPSY Right 05/16/2022   x 2 areas/ [place] bx & Stereo bx coil clip/path pending   BREAST BIOPSY Left 05/16/2022   [place] bx/ path pending   BREAST RECONSTRUCTION WITH PLACEMENT OF TISSUE EXPANDER AND ALLODERM Bilateral 07/26/2022   Procedure: BREAST RECONSTRUCTION WITH PLACEMENT OF TISSUE EXPANDER AND ALLODERM;  Surgeon: Wallace Going, DO;  Location: [facility] ORS;  Service: Plastics;  Laterality: Bilateral;   CESAREAN SECTION     COLONOSCOPY WITH PROPOFOL N/A 11/24/2018   Procedure: COLONOSCOPY WITH PROPOFOL;  Surgeon: Jonathon Bellows, MD;  Location: [facility] ENDOSCOPY;  Service: Gastroenterology;  Laterality: N/A;   COLONOSCOPY WITH PROPOFOL N/A 07/03/2021   Procedure: COLONOSCOPY WITH PROPOFOL;  Surgeon: Jonathon Bellows, MD;  Location: [facility] ENDOSCOPY;  Service: Gastroenterology;  Laterality: N/A;   GANGLION CYST EXCISION Right    wrist   SIMPLE MASTECTOMY WITH AXILLARY SENTINEL NODE BIOPSY Bilateral 07/26/2022   Procedure: SIMPLE MASTECTOMY, Otho Ket, PA-C to assist;  Surgeon: Olean Ree, MD;  Location: [facility] ORS;  Service:  General;  Laterality: Bilateral;   TOTAL ABDOMINAL HYSTERECTOMY      There were no vitals filed for this visit.   Subjective Assessment - 08/22/22 1212     Subjective  DOing okay my R arm motion getting better but have this cord that is tight in my R armpit and then my L arm tender and sore under my arm and breast where ln removed - seeing the surgeon tomorrow again- they  not allowing me to use this L arm a lot -I do not want anymore fillings                 LYMPHEDEMA/ONCOLOGY QUESTIONNAIRE - 08/22/22 0001       Right Upper Extremity Lymphedema   10 cm Proximal to Olecranon Process 24 cm    Olecranon Process 23.5 cm    15 cm Proximal to Ulnar Styloid Process 21.5 cm    Just Proximal to Ulnar Styloid Process 24 cm      Left Upper Extremity Lymphedema   10 cm Proximal to Olecranon Process 24.8 cm    Olecranon Process 24 cm    15 cm Proximal to Ulnar Styloid Process 21.8 cm    Just Proximal to Ulnar Styloid Process 24.5 cm             NOTE FROM SURGEON 08/20/22: Patient is a 66 year old female with history of breast cancer.  She is status post bilateral mastectomy with immediate reconstruction using  tissue expanders and AlloDerm performed by Dr. Marla Roe on 07/26/2022.  Patient had 455 cc expanders placed bilaterally.  Patient presents to the clinic today for postoperative follow-up.   Patient was last seen in the clinic on 08/10/2022.  At this visit, she was reported she was doing well.  On exam, expanders were noted to have good symmetry.  There was dark serosanguineous drainage noted in her drains.  The JP drains were removed.  There was a steady amount of serosanguineous drainage from the drain sites after the drains were removed.  Gauze was applied repeatedly until it slowed.  Expanders were filled bilaterally with 50 cc for a total of 300 cc / 455 cc in each expander.  Patient reported at this visit that she does not want to be too large.   Today, patient reports  she is doing well.  She states she has a little bit of soreness where they took the lymph nodes to her left axillary region.  She denies any other issues at this time.  She denies any fevers or chills.  Patient reports that she does not want to be much bigger and would like one last small fill today.    Chaperone present on exam.  On exam, patient is sitting upright in no acute distress.  Expanders are in place and are fairly symmetric.  There is no overlying erythema to the skin.  There appears to be some faint residual ecchymosis.  Incisions appear to be intact with Steri-Strips.  Steri-Strips were removed to all incisions.  To the right breast, the incision to the right lateral breast and to the middle of the breast appear to be intact.  There appears to be some residual Dermabond noted to the incision to the middle of the breast.  There is no drainage noted.  To the left breast, the lateral incision appears to be intact.  To the incision to the middle of the breast, there is a suture protruding from the skin with some surrounding irritation.  There is a small pinpoint wound that appears to be superficial.  The expander is not visualized.  There is no drainage from this.  The suture was trimmed and patient tolerated well.  Vaseline was placed over the area.  Drain sites appear to be healing well.   We placed injectable saline in the Expander using a sterile technique: Right: 25 cc for a total of 325 cc/455 cc Left: 25 cc for a total of 325 cc/455 cc    I discussed with the patient that she will need to closely monitor her incisions.  I discussed with the patient that she needs to monitor the small area where the suture was protruding from the skin.  I discussed with her that if this becomes larger, if it starts draining, or becomes red, she needs to let us know immediately.  Patient expressed understanding.   I discussed with the patient that she needs to apply Vaseline and gauze over the area to her  left breast incision.  I discussed with the patient that she should apply Vaseline to all of her incisions as well.  Patient expressed understanding.   Patient to follow-up on Thursday for reevaluation.    OT SCREEN 08/22/22: Pt arrive OT screen about 4 wks s/p from bilateral mastectomy with expanders put in immediately. Pt with last filling 2 days ago - Pt tender at L breast and where ln removed - appt tomorrow again with surgeon. Pt not using L dominant hand  a lot - only about 80 degrees of flexion and ABD - per surgeon order.  R UE shoulder AROM WFL but end range limited - also show some lymphatic cord in axilla limiting end range - provided for pt AAROM on wall slides for R shoulder flexion and ABD -stop when feeling pull.  Pt will follow up next week - after follow up with surgeon tomorrow for possibly release and soft tissue to R axilla to release cording. And if okay will initiate ROM to L shoulder. Circumference in bilateral UE WNL - no signs and symptoms -pt is L hand dominant- work form home and mostly on computer                         Patient will benefit from skilled therapeutic intervention in order to improve the following deficits and impairments:           Visit Diagnosis: Stiffness of right shoulder, not elsewhere classified  Stiffness of left shoulder, not elsewhere classified    Problem List Patient Active Problem List   Diagnosis Date Noted   Bilateral breast cancer ([place]) 07/26/2022   Breast cancer ([place]) 06/12/2022   Cancer of overlapping sites of right breast ([place]) 05/21/2022   H/O total hysterectomy 12/22/2021   Lumbar spondylosis 09/20/2021   Right hip pain 09/20/2021   Skin nodule 06/30/2013    Rosalyn Gess, OTR/L,CLT 08/22/2022, 12:17 PM  [facility] at [facility] [address] [place], [place], [address] Phone: [phone]   Fax:  [phone]  Name: Marie Nguyen MRN:  [id number] Date of Birth: Mar 10, 1956

## 2022-08-22 NOTE — Progress Notes (Signed)
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    INTERVAL HISTORY: Patient returns to clinic today to discuss her final pathology results and treatment planning.  She underwent bilateral implant removal, mastectomy, and reconstruction on July 26, 2022.  She continues to have expanders, but otherwise feels well.  She has no neurologic complaints.  She denies any recent fevers or illnesses.  She has a good appetite and denies weight loss.  She has no chest pain, shortness of breath, cough, or home this.  She denies any nausea, vomiting, constipation, or diarrhea.  She has no urinary complaints.  Patient offers no further specific complaints today.  REVIEW OF SYSTEMS:   Review of Systems  Constitutional: Negative.  Negative for fever, malaise/fatigue and weight loss.  Respiratory: Negative.  Negative for cough, hemoptysis and shortness of breath.   Cardiovascular: Negative.  Negative for chest pain, palpitations and leg swelling.  Gastrointestinal: Negative.  Negative for abdominal pain.  Genitourinary: Negative.  Negative for dysuria.  Musculoskeletal: Negative.  Negative for back pain.  Skin: Negative.  Negative for rash.  Neurological: Negative.  Negative for dizziness, focal weakness, weakness and headaches.  Psychiatric/Behavioral: Negative.  The patient is not nervous/anxious.     As per HPI. Otherwise, a complete review of systems is negative.  PAST MEDICAL HISTORY: Past Medical History:  Diagnosis Date   Allergy    Arthritis    Eczema    GERD (gastroesophageal reflux  disease)    Heart murmur    Hypertension     PAST SURGICAL HISTORY: Past Surgical History:  Procedure Laterality Date   ABDOMINAL HYSTERECTOMY     AUGMENTATION MAMMAPLASTY     AXILLARY SENTINEL NODE BIOPSY Bilateral 07/26/2022   Procedure: AXILLARY SENTINEL NODE BIOPSY;  Surgeon: Olean Ree, MD;  Location: [facility] ORS;  Service: General;  Laterality: Bilateral;   BREAST BIOPSY Right    neg   BREAST BIOPSY Right 05/16/2022   x 2 areas/ [place] bx & Stereo bx coil clip/path pending   BREAST BIOPSY Left 05/16/2022   [place] bx/ path pending   BREAST RECONSTRUCTION WITH PLACEMENT OF TISSUE EXPANDER AND ALLODERM Bilateral 07/26/2022   Procedure: BREAST RECONSTRUCTION WITH PLACEMENT OF TISSUE EXPANDER AND ALLODERM;  Surgeon: Wallace Going, DO;  Location: [facility] ORS;  Service: Plastics;  Laterality: Bilateral;   CESAREAN SECTION     COLONOSCOPY WITH PROPOFOL N/A 11/24/2018   Procedure: COLONOSCOPY WITH PROPOFOL;  Surgeon: Jonathon Bellows, MD;  Location: [facility] ENDOSCOPY;  Service: Gastroenterology;  Laterality: N/A;   COLONOSCOPY WITH PROPOFOL N/A 07/03/2021   Procedure: COLONOSCOPY WITH PROPOFOL;  Surgeon: Jonathon Bellows, MD;  Location: [facility] ENDOSCOPY;  Service: Gastroenterology;  Laterality: N/A;   GANGLION CYST EXCISION Right    wrist   SIMPLE MASTECTOMY WITH AXILLARY SENTINEL NODE BIOPSY Bilateral 07/26/2022   Procedure: SIMPLE MASTECTOMY, Otho Ket, PA-C to assist;  Surgeon: Olean Ree, MD;  Location: [facility] ORS;  Service: General;  Laterality: Bilateral;   TOTAL ABDOMINAL HYSTERECTOMY      FAMILY HISTORY: Family History  Problem Relation Age of Onset   Breast cancer Mother    Cancer Mother    Hypertension Mother  Diabetes Father    Hypertension Father    Stroke Father    Hypertension Sister    Cancer Maternal Grandfather    Colon cancer Maternal Grandfather     ADVANCED DIRECTIVES (Y/N):  N  HEALTH MAINTENANCE: Social History   Tobacco Use   Smoking status: Never    Passive  exposure: Never   Smokeless tobacco: Never  Vaping Use   Vaping Use: Never used  Substance Use Topics   Alcohol use: Yes    Comment: rarely   Drug use: Never     Colonoscopy:  PAP:  Bone density:  Lipid panel:  Allergies  Allergen Reactions   Latex Rash    Current Outpatient Medications  Medication Sig Dispense Refill   acetaminophen (TYLENOL) 500 MG tablet Take 1,000 mg by mouth every 6 (six) hours as needed for mild pain.     azelastine (ASTELIN) 0.1 % nasal spray Place 1 spray into both nostrils daily. Use in each nostril as directed     diazepam (VALIUM) 2 MG tablet Take 1 tablet (2 mg total) by mouth every 12 (twelve) hours as needed for muscle spasms. 20 tablet 0   fluticasone (FLONASE) 50 MCG/ACT nasal spray Place 1 spray into both nostrils daily as needed for allergies or rhinitis.     ibuprofen (ADVIL) 200 MG tablet Take 200 mg by mouth every 6 (six) hours as needed for mild pain. 2 - 3 tabs     letrozole (FEMARA) 2.5 MG tablet Take 1 tablet (2.5 mg total) by mouth daily. 90 tablet 3   lidocaine-prilocaine (EMLA) cream Apply to the areola of both breasts and cover with plastic wrap one hour prior to leaving for surgery. 5 g 0   lisinopril-hydrochlorothiazide (ZESTORETIC) 10-12.5 MG tablet Take 1 tablet by mouth daily. 90 tablet 1   Multiple Vitamin (MULTIVITAMIN) tablet Take 1 tablet by mouth daily.     Omega-3 Fatty Acids (FISH OIL) 1000 MG CAPS Take 1 capsule by mouth as needed.     omeprazole (PRILOSEC) 20 MG capsule Take 20 mg by mouth daily.     ondansetron (ZOFRAN) 4 MG tablet Take 1 tablet (4 mg total) by mouth every 8 (eight) hours as needed for nausea or vomiting. 20 tablet 0   traMADol (ULTRAM) 50 MG tablet Take 1 tablet (50 mg total) by mouth every 6 (six) hours as needed. 30 tablet 0   triamcinolone cream (KENALOG) 0.1 % 1 Application as needed.     No current facility-administered medications for this visit.    OBJECTIVE: Vitals:   08/22/22 1013  BP:  (!) 132/97  Pulse: 70  Resp: 16  Temp: 98.5 F (36.9 C)  SpO2: 100%     Body mass index is 21.39 kg/m.    ECOG FS:0 - Asymptomatic  General: Well-developed, well-nourished, no acute distress. Eyes: Pink conjunctiva, anicteric sclera. HEENT: Normocephalic, moist mucous membranes. Breast: Bilateral mastectomy. Lungs: No audible wheezing or coughing. Heart: Regular rate and rhythm. Abdomen: Soft, nontender, no obvious distention. Musculoskeletal: No edema, cyanosis, or clubbing. Neuro: Alert, answering all questions appropriately. Cranial nerves grossly intact. Skin: No rashes or petechiae noted. Psych: Normal affect.    LAB RESULTS:  Lab Results  Component Value Date   NA 138 07/27/2022   K 3.9 07/27/2022   CL 105 07/27/2022   CO2 26 07/27/2022   GLUCOSE 109 (H) 07/27/2022   BUN 10 07/27/2022   CREATININE 0.67 07/27/2022   CALCIUM 8.8 (L) 07/27/2022   PROT 6.9 03/10/2021  ALBUMIN 4.7 12/22/2021   AST 18 03/10/2021   ALT 16 03/10/2021   ALKPHOS 58 03/10/2021   BILITOT 1.3 (H) 03/10/2021   GFRNONAA >60 07/27/2022   GFRAA 102 11/19/2018    Lab Results  Component Value Date   WBC 5.4 07/28/2022   NEUTROABS 4.7 03/13/2022   HGB 10.7 (L) 07/28/2022   HCT 32.7 (L) 07/28/2022   MCV 98.8 07/28/2022   PLT 202 07/28/2022     STUDIES: NM Sentinel Node Inj-No Rpt (Breast)  Result Date: 07/26/2022 Sulfur Colloid was injected by the Nuclear Medicine Technologist for sentinel lymph node localization.   NM Sentinel Node Inj-No Rpt (Breast)  Result Date: 07/26/2022 Sulfur Colloid was injected by the Nuclear Medicine Technologist for sentinel lymph node localization.    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  Oncotype DX 14.  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  Her right breast cancer was a pT1c, left breast cancer was pT1b.  Patient  had bilateral mastectomy, therefore there is no need for adjuvant XRT.  Oncotype DX score was low risk at 14, therefore despite 2 primaries patient does not require adjuvant chemotherapy.  She will benefit from letrozole for minimum of 5 years, but will likely extend treatment given her high risk disease.  Will get a baseline bone mineral density in the next 1 to 2 weeks.  Return to clinic in 3 months for routine evaluation.   Genetics: Patient has an appointment with genetic counseling in December 2023.  I spent a total of 30 minutes reviewing chart data, face-to-face evaluation with the patient, counseling and coordination of care as detailed above.    Patient expressed understanding and was in agreement with this plan. She also understands that She can call clinic at any time with any questions, concerns, or complaints.    Cancer Staging  Bilateral breast cancer [facility]) Staging form: Breast, AJCC 8th Edition - Pathologic stage from 08/22/2022: Stage IA (pT1c, pN0, cM0, G3, ER+, PR+, HER2-, Oncotype DX score: 14) - Signed by Lloyd Huger, MD on 08/22/2022 Stage prefix: Initial diagnosis Multigene prognostic tests performed: Oncotype DX Recurrence score range: Greater than or equal to 11 Histologic grading system: 3 grade system   Lloyd Huger, MD   08/22/2022 2:59 PM    Both right and left breast cancer were stage Ia.

## 2022-08-22 NOTE — Progress Notes (Signed)
Met with patient at follow up visit with Dr. Grayland Ormond.    She is doing well post surgery.  She is interested in genetic counseling, referral placed.

## 2022-08-22 NOTE — Telephone Encounter (Signed)
Last RF 06/01/22 #90 1 RF  Requested Prescriptions  Refused Prescriptions Disp Refills   lisinopril-hydrochlorothiazide (ZESTORETIC) 10-12.5 MG tablet [Pharmacy Med Name: LISINOPRIL-HCTZ 10-12.5 MG TAB] 90 tablet 0    Sig: Take 1 tablet by mouth daily.     Cardiovascular:  ACEI + Diuretic Combos Failed - 08/22/2022 12:29 PM      Failed - Last BP in normal range    BP Readings from Last 1 Encounters:  08/22/22 (!) 132/97         Passed - Na in normal range and within 180 days    Sodium  Date Value Ref Range Status  07/27/2022 138 135 - 145 mmol/L Final  12/22/2021 144 134 - 144 mmol/L Final         Passed - K in normal range and within 180 days    Potassium  Date Value Ref Range Status  07/27/2022 3.9 3.5 - 5.1 mmol/L Final         Passed - Cr in normal range and within 180 days    Creatinine, Ser  Date Value Ref Range Status  07/27/2022 0.67 0.44 - 1.00 mg/dL Final         Passed - eGFR is 30 or above and within 180 days    GFR calc Af Amer  Date Value Ref Range Status  11/19/2018 102 >59 mL/min/1.73 Final   GFR, Estimated  Date Value Ref Range Status  07/27/2022 >60 >60 mL/min Final    Comment:    (NOTE) Calculated using the CKD-EPI Creatinine Equation (2021)    eGFR  Date Value Ref Range Status  12/22/2021 97 >59 mL/min/1.73 Final         Passed - Patient is not pregnant      Passed - Valid encounter within last 6 months    Recent Outpatient Visits           2 months ago Primary hypertension   [name] [place], [place], MD   5 months ago Annual physical exam   [facility] [place], Deanna C, MD   8 months ago Primary hypertension   [facility] [place], Deanna C, MD   9 months ago Primary hypertension   [facility] [place], Deanna C, MD   11 months  ago Lumbar spondylosis   [facility], Earley Ab[place], MD       Future Appointments             In 3 months Juline Patch, MD [facility] at [facility], [facility], [place]

## 2022-08-23 ENCOUNTER — Ambulatory Visit (INDEPENDENT_AMBULATORY_CARE_PROVIDER_SITE_OTHER): Payer: BC Managed Care – PPO | Admitting: Surgical

## 2022-08-23 ENCOUNTER — Encounter: Payer: Self-pay | Admitting: Student

## 2022-08-23 VITALS — BP 128/83 | HR 77

## 2022-08-23 DIAGNOSIS — Z9889 Other specified postprocedural states: Secondary | ICD-10-CM

## 2022-08-23 DIAGNOSIS — C50811 Malignant neoplasm of overlapping sites of right female breast: Secondary | ICD-10-CM

## 2022-08-23 DIAGNOSIS — C50911 Malignant neoplasm of unspecified site of right female breast: Secondary | ICD-10-CM

## 2022-08-23 NOTE — Progress Notes (Signed)
Patient is a 66 year old female with history of breast cancer.  She is status post bilateral mastectomy with immediate reconstruction using tissue expanders and AlloDerm performed by Dr. Marla Roe on 07/26/2022.  Patient had 455 cc expanders placed bilaterally.  Patient presents to the clinic today for postoperative follow-up.     Patient was last seen in the clinic on 08/20/2022.  At this visit, patient reported she was doing well.  She reported that she wanted one last fill.  On exam, expanders were in place and fairly symmetric. To the left breast incision, there appears to be a suture protruding from the skin that was causing some irritation to the skin.  The suture was removed.  The pinpoint wound near this suture protrusion appears to be superficial.  She has been doing Vaseline to the left breast wound.  This has been going well.  She is not having any issues.  She reports today that she is happy with the size of bilateral breast, reports her goal was about a B cup.  Chaperone present on exam On exam bilateral breast incisions are intact and healing well.  She does have a superficial abrasion of the left medial breast that is approximately 1.5 x 1 mm.  No surrounding erythema or cellulitic changes.  The depth of the wound is approximately 0.5 mm.  Bilateral breasts are soft, no subcutaneous fluid collection noted palpation.  Assessment/plan:  66 year old female status post bilateral mastectomy with immediate reconstruction placement of tissue expanders and Flex HD with Dr. Marla Roe on 07/26/2022.She is doing well with expansion, she is happy with the current size.  She does not want any additional fills.  The area of concern has improved.  She does have a small superficial abrasion of the left breast still present but the depth is very minimal approximately 0.5 mm.  Recommend Vaseline and gauze to the superficial abrasion until it has completely healed.  We discussed the option of silicone versus  saline implants, discussed the pros and cons of each.  She seems to be leaning towards silicone but I encouraged her to think this over and we can discuss this further at her next appointment.  We will plan to see her back in 3 weeks.  Pictures were taken and placed in patient's chart with patient's permission.

## 2022-08-24 ENCOUNTER — Ambulatory Visit
Admission: RE | Admit: 2022-08-24 | Discharge: 2022-08-24 | Disposition: A | Discharge status: Home / Self Care | Payer: BC Managed Care – PPO | Source: Ambulatory Visit | Attending: Oncology | Admitting: Oncology

## 2022-08-24 DIAGNOSIS — C50011 Malignant neoplasm of nipple and areola, right female breast: Secondary | ICD-10-CM | POA: Insufficient documentation

## 2022-08-24 DIAGNOSIS — Z780 Asymptomatic menopausal state: Secondary | ICD-10-CM | POA: Diagnosis not present

## 2022-08-24 DIAGNOSIS — M8589 Other specified disorders of bone density and structure, multiple sites: Secondary | ICD-10-CM | POA: Diagnosis not present

## 2022-08-30 ENCOUNTER — Ambulatory Visit: Payer: BC Managed Care – PPO | Attending: Oncology | Admitting: Occupational Therapy

## 2022-08-30 ENCOUNTER — Encounter: Payer: Self-pay | Admitting: Occupational Therapy

## 2022-08-30 ENCOUNTER — Other Ambulatory Visit: Payer: Self-pay

## 2022-08-30 DIAGNOSIS — M25612 Stiffness of left shoulder, not elsewhere classified: Secondary | ICD-10-CM | POA: Insufficient documentation

## 2022-08-30 DIAGNOSIS — C50811 Malignant neoplasm of overlapping sites of right female breast: Secondary | ICD-10-CM

## 2022-08-30 DIAGNOSIS — M25611 Stiffness of right shoulder, not elsewhere classified: Secondary | ICD-10-CM | POA: Diagnosis not present

## 2022-08-30 DIAGNOSIS — Z9013 Acquired absence of bilateral breasts and nipples: Secondary | ICD-10-CM | POA: Diagnosis not present

## 2022-08-30 NOTE — Therapy (Signed)
[facility] [address], [place], [address] Phone: [phone]   Fax:  [phone]  Occupational Therapy Evaluation  Patient Details  Name: Marie Nguyen MRN: [id number] Date of Birth: 10-13-1955 No data recorded  Encounter Date: 08/30/2022   OT End of Session - 08/30/22 1848     Visit Number 1    Number of Visits 6    Date for OT Re-Evaluation 11/22/22    OT Start Time 1540    OT Stop Time 1614    OT Time Calculation (min) 34 min    Activity Tolerance Patient tolerated treatment well    Behavior During Therapy WFL for tasks assessed/performed             Past Medical History:  Diagnosis Date   Allergy    Arthritis    Eczema    GERD (gastroesophageal reflux disease)    Heart murmur    Hypertension     Past Surgical History:  Procedure Laterality Date   ABDOMINAL HYSTERECTOMY     AUGMENTATION MAMMAPLASTY     AXILLARY SENTINEL NODE BIOPSY Bilateral 07/26/2022   Procedure: AXILLARY SENTINEL NODE BIOPSY;  Surgeon: Olean Ree, MD;  Location: [facility] ORS;  Service: General;  Laterality: Bilateral;   BREAST BIOPSY Right    neg   BREAST BIOPSY Right 05/16/2022   x 2 areas/ [place] bx & Stereo bx coil clip/INC and DCIS   BREAST BIOPSY Left 05/16/2022   [place] bx/ [facility] with DCIS   BREAST RECONSTRUCTION WITH PLACEMENT OF TISSUE EXPANDER AND ALLODERM Bilateral 07/26/2022   Procedure: BREAST RECONSTRUCTION WITH PLACEMENT OF TISSUE EXPANDER AND ALLODERM;  Surgeon: Wallace Going, DO;  Location: [facility] ORS;  Service: Plastics;  Laterality: Bilateral;   CESAREAN SECTION     COLONOSCOPY WITH PROPOFOL N/A 11/24/2018   Procedure: COLONOSCOPY WITH PROPOFOL;  Surgeon: Jonathon Bellows, MD;  Location: [facility] ENDOSCOPY;  Service: Gastroenterology;  Laterality: N/A;   COLONOSCOPY WITH PROPOFOL N/A 07/03/2021   Procedure: COLONOSCOPY WITH PROPOFOL;  Surgeon: Jonathon Bellows, MD;  Location: [facility] ENDOSCOPY;  Service:  Gastroenterology;  Laterality: N/A;   GANGLION CYST EXCISION Right    wrist   MASTECTOMY Bilateral    SIMPLE MASTECTOMY WITH AXILLARY SENTINEL NODE BIOPSY Bilateral 07/26/2022   Procedure: SIMPLE MASTECTOMY, Otho Ket, PA-C to assist;  Surgeon: Olean Ree, MD;  Location: [facility] ORS;  Service: General;  Laterality: Bilateral;   TOTAL ABDOMINAL HYSTERECTOMY      There were no vitals filed for this visit.   Subjective Assessment - 08/30/22 1845     Subjective  I had last filling 2 visits ago - follow up next Friday- done your exercises you gave me last time for my L too - some improvement    Pertinent History 08/23/22 PA surgery note - Patient is a 66 year old female with history of breast cancer.  She is status post bilateral mastectomy with immediate reconstruction using tissue expanders and AlloDerm performed by Dr. Marla Roe on 07/26/2022.  Patient had 455 cc expanders placed bilaterally.  Patient presents to the clinic today for postoperative follow-up.     Patient was last seen in the clinic on 08/20/2022.  At this visit, patient reported she was doing well.  She reported that she wanted one last fill.  On exam, expanders were in place and fairly symmetric. To the left breast incision, there appears to be a suture protruding from the skin that was causing some irritation to the skin.  The suture was removed.  The pinpoint wound near this suture protrusion appears to be superficial.  She has been doing Vaseline to the left breast wound.  This has been going well.  She is not having any issues.  She reports today that she is happy with the size of bilateral breast, reports her goal was about a B cup.     Chaperone present on exam  On exam bilateral breast incisions are intact and healing well.  She does have a superficial abrasion of the left medial breast that is approximately 1.5 x 1 mm.  No surrounding erythema or cellulitic changes.  The depth of the wound is approximately 0.5 mm.  Bilateral  breasts are soft, no subcutaneous fluid collection noted palpation.     Assessment/plan:     66 year old female status post bilateral mastectomy with immediate reconstruction placement of tissue expanders and Flex HD with Dr. Marla Roe on 07/26/2022.She is doing well with expansion, she is happy with the current size.  She does not want any additional fills.  The area of concern has improved.  She does have a small superficial abrasion of the left breast still present but the depth is very minimal approximately 0.5 mm.  Recommend Vaseline and gauze to the superficial abrasion until it has completely healed.     We discussed the option of silicone versus saline implants, discussed the pros and cons of each.  She seems to be leaning towards silicone but I encouraged her to think this over and we can discuss this further at her next appointment.    Patient Stated Goals Want to get my motion and strength in both arms back and get my silicon implants    Currently in Pain? Yes    Pain Score 1     Pain Location Breast    Pain Orientation Right;Left    Pain Descriptors / Indicators Tightness;Sore;Tender    Pain Type Surgical pain    Pain Onset 1 to 4 weeks ago                     L shoulder flexion and ABD 140 , R 145  After session R 160 , L 145 degrees slight pull in axilla in L more than R and into chest  2 lymphatic cords in R axilla -attempt to release - did improve after some myofascial - pt had less pull - cords smaller  and only in ABD  Pulleys -provided for AAROM for shoulder flexion , abd 20 reps -and 2 x  day Stop when feeling pull Protraction and retraction of scapula supine 10reps  And ext rotation supine - 12 reps Slight pull  2 x day             OT Education - 08/30/22 1848     Education Details Finding of eval and HEP for ROM    Person(s) Educated Patient    Methods Explanation;Demonstration;Tactile cues;Verbal cues;Handout    Comprehension Verbal cues  required;Returned demonstration;Verbalized understanding                 OT Long Term Goals - 08/30/22 1859       OT LONG TERM GOAL #1   Title Pt bilateral shoulder AROM increase to WNL to pull shirt over head and take objects out of cabinets without increase symptoms    Baseline expanders still in - last week L shoulder flexion ABD was 80 - now this date 140 - R 145 - 2  lymphatic cords in axilla - possible implants in Febr 24    Time 12    Period Weeks    Status New    Target Date 11/22/22                   Plan - 08/30/22 1851     Clinical Impression Statement Pt  at eval today s/p from bilateral mastectomy with expanders put in immediately on 07/26/22 by DR Dillingham. Pt was seen for OT screen last week  with only 80 degrees of L shoulder AROM - but this date return for eval was seen 08/23/22 by surgeon and had no filling. She following up next Friday with surgeon again. Showed great progress since last week in AROM for bilateral shoulders. Pt do show 2 lymphatic cords in R axilla. Pt is L dominant hand. Did get v/o from PA  And if okay will initiate ROM to L shoulder.  Circumference in bilateral UE WNL - no signs and symptoms -pt is L hand dominant- work form home and mostly on computer             Patient will benefit from skilled therapeutic intervention in order to improve the following deficits and impairments:   Body Structure / Function / Physical Skills: ADL, Flexibility, ROM, UE functional use, Pain, IADL, Strength, Scar mobility       Visit Diagnosis: Stiffness of left shoulder, not elsewhere classified  Stiffness of right shoulder, not elsewhere classified  S/P mastectomy, bilateral    Problem List Patient Active Problem List   Diagnosis Date Noted   Bilateral breast cancer ([place]) 07/26/2022   H/O total hysterectomy 12/22/2021   Lumbar spondylosis 09/20/2021   Right hip pain 09/20/2021   Skin nodule 06/30/2013    Rosalyn Gess,  OTR/L,CLT 08/30/2022, 7:02 PM  [facility] [address] [place], [address] Phone: [phone]   Fax:  [phone]  Name: Marie Nguyen MRN: [id number] Date of Birth: 1956-08-20

## 2022-08-31 DIAGNOSIS — Z170 Estrogen receptor positive status [ER+]: Secondary | ICD-10-CM | POA: Diagnosis not present

## 2022-08-31 DIAGNOSIS — C50412 Malignant neoplasm of upper-outer quadrant of left female breast: Secondary | ICD-10-CM | POA: Diagnosis not present

## 2022-09-03 ENCOUNTER — Encounter: Payer: Self-pay | Admitting: Oncology

## 2022-09-07 ENCOUNTER — Ambulatory Visit (INDEPENDENT_AMBULATORY_CARE_PROVIDER_SITE_OTHER): Payer: BC Managed Care – PPO | Admitting: Surgical

## 2022-09-07 ENCOUNTER — Encounter: Payer: Self-pay | Admitting: Surgical

## 2022-09-07 VITALS — BP 165/99 | HR 85 | Wt 165.0 lb

## 2022-09-07 DIAGNOSIS — C50911 Malignant neoplasm of unspecified site of right female breast: Secondary | ICD-10-CM

## 2022-09-07 DIAGNOSIS — C50811 Malignant neoplasm of overlapping sites of right female breast: Secondary | ICD-10-CM

## 2022-09-07 DIAGNOSIS — Z9889 Other specified postprocedural states: Secondary | ICD-10-CM

## 2022-09-07 NOTE — Progress Notes (Signed)
Patient is a 66 year old here for follow-up on her breast reconstruction.  She is status post bilateral mastectomy with immediate reconstruction with placement tissue expanders and ADM with Dr. Marla Roe on 07/26/2022.  She had 455 cc expanders placed bilaterally.  She is now 6 weeks postop.  At her last appointment she discussed with provider that she did not want any additional fills and was happy with the current size of the expander.  We have previously discussed that patient will need to wait 3 months from placement of expanders to have the expanders exchanged to implants.  This would put her exchange date at around October 26, 2022  She currently has 325/455 in each breast expander.  Patient reports she has been working with occupational therapy for improvement in her range of motion and tenderness of her bilateral breast and upper back muscles.  She reports has been very helpful.  Chaperone present on exam On exam bilateral breast incisions are intact, healing well.  There is no erythema or cellulitic changes noted.  Bilateral breasts are soft, no subcutaneous fluid collections noted with palpation.  Follow-up with Dr. Marla Roe in 1 month to discuss her coplaning.  Will route to surgical's scheduling staff today to plan for exchange early to mid February.

## 2022-09-11 ENCOUNTER — Encounter: Payer: Self-pay | Admitting: Surgical

## 2022-09-12 ENCOUNTER — Inpatient Hospital Stay: Payer: BC Managed Care – PPO | Attending: Oncology | Admitting: Occupational Therapy

## 2022-09-12 ENCOUNTER — Ambulatory Visit: Payer: BC Managed Care – PPO | Admitting: Occupational Therapy

## 2022-09-12 DIAGNOSIS — M25612 Stiffness of left shoulder, not elsewhere classified: Secondary | ICD-10-CM | POA: Insufficient documentation

## 2022-09-12 DIAGNOSIS — M25611 Stiffness of right shoulder, not elsewhere classified: Secondary | ICD-10-CM | POA: Diagnosis not present

## 2022-09-12 DIAGNOSIS — Z9013 Acquired absence of bilateral breasts and nipples: Secondary | ICD-10-CM | POA: Diagnosis not present

## 2022-09-14 NOTE — Therapy (Signed)
[facility] [address] [place], [address] Phone: [phone]   Fax:  [phone]  Occupational Therapy Treatment  Patient Details  Name: Marie Nguyen MRN: [id number] Date of Birth: 09/02/1956 No data recorded  Encounter Date: 09/12/2022   OT End of Session - 09/14/22 1040     Visit Number 2    Number of Visits 6    Date for OT Re-Evaluation 11/22/22    OT Start Time 1300    OT Stop Time 1329    OT Time Calculation (min) 29 min    Activity Tolerance Patient tolerated treatment well    Behavior During Therapy WFL for tasks assessed/performed             Past Medical History:  Diagnosis Date   Allergy    Arthritis    Eczema    GERD (gastroesophageal reflux disease)    Heart murmur    Hypertension     Past Surgical History:  Procedure Laterality Date   ABDOMINAL HYSTERECTOMY     AUGMENTATION MAMMAPLASTY     AXILLARY SENTINEL NODE BIOPSY Bilateral 07/26/2022   Procedure: AXILLARY SENTINEL NODE BIOPSY;  Surgeon: Olean Ree, MD;  Location: [facility] ORS;  Service: General;  Laterality: Bilateral;   BREAST BIOPSY Right    neg   BREAST BIOPSY Right 05/16/2022   x 2 areas/ [place] bx & Stereo bx coil clip/INC and DCIS   BREAST BIOPSY Left 05/16/2022   [place] bx/ [facility] with DCIS   BREAST RECONSTRUCTION WITH PLACEMENT OF TISSUE EXPANDER AND ALLODERM Bilateral 07/26/2022   Procedure: BREAST RECONSTRUCTION WITH PLACEMENT OF TISSUE EXPANDER AND ALLODERM;  Surgeon: Wallace Going, DO;  Location: [facility] ORS;  Service: Plastics;  Laterality: Bilateral;   CESAREAN SECTION     COLONOSCOPY WITH PROPOFOL N/A 11/24/2018   Procedure: COLONOSCOPY WITH PROPOFOL;  Surgeon: Jonathon Bellows, MD;  Location: [facility] ENDOSCOPY;  Service: Gastroenterology;  Laterality: N/A;   COLONOSCOPY WITH PROPOFOL N/A 07/03/2021   Procedure: COLONOSCOPY WITH PROPOFOL;  Surgeon: Jonathon Bellows, MD;  Location: [facility] ENDOSCOPY;  Service:  Gastroenterology;  Laterality: N/A;   GANGLION CYST EXCISION Right    wrist   MASTECTOMY Bilateral    SIMPLE MASTECTOMY WITH AXILLARY SENTINEL NODE BIOPSY Bilateral 07/26/2022   Procedure: SIMPLE MASTECTOMY, Otho Ket, PA-C to assist;  Surgeon: Olean Ree, MD;  Location: [facility] ORS;  Service: General;  Laterality: Bilateral;   TOTAL ABDOMINAL HYSTERECTOMY      There were no vitals filed for this visit.   Subjective Assessment - 09/14/22 1039     Subjective  I am so ready to get these expanders out - they are so tight and hard    Pertinent History 08/23/22 PA surgery note - Patient is a 66 year old female with history of breast cancer.  She is status post bilateral mastectomy with immediate reconstruction using tissue expanders and AlloDerm performed by Dr. Marla Roe on 07/26/2022.  Patient had 455 cc expanders placed bilaterally.  Patient presents to the clinic today for postoperative follow-up.     Patient was last seen in the clinic on 08/20/2022.  At this visit, patient reported she was doing well.  She reported that she wanted one last fill.  On exam, expanders were in place and fairly symmetric. To the left breast incision, there appears to be a suture protruding from the skin that was causing some irritation to the skin.  The suture was removed.  The pinpoint wound near this suture  protrusion appears to be superficial.  She has been doing Vaseline to the left breast wound.  This has been going well.  She is not having any issues.  She reports today that she is happy with the size of bilateral breast, reports her goal was about a B cup.     Chaperone present on exam  On exam bilateral breast incisions are intact and healing well.  She does have a superficial abrasion of the left medial breast that is approximately 1.5 x 1 mm.  No surrounding erythema or cellulitic changes.  The depth of the wound is approximately 0.5 mm.  Bilateral breasts are soft, no subcutaneous fluid collection noted  palpation.     Assessment/plan:     66 year old female status post bilateral mastectomy with immediate reconstruction placement of tissue expanders and Flex HD with Dr. Marla Roe on 07/26/2022.She is doing well with expansion, she is happy with the current size.  She does not want any additional fills.  The area of concern has improved.  She does have a small superficial abrasion of the left breast still present but the depth is very minimal approximately 0.5 mm.  Recommend Vaseline and gauze to the superficial abrasion until it has completely healed.     We discussed the option of silicone versus saline implants, discussed the pros and cons of each.  She seems to be leaning towards silicone but I encouraged her to think this over and we can discuss this further at her next appointment.    Patient Stated Goals Want to get my motion and strength in both arms back and get my silicon implants    Currently in Pain? Yes    Pain Score 1     Pain Location Breast    Pain Orientation Right;Left    Pain Descriptors / Indicators Tightness    Pain Type Surgical pain                    Patient arrived again with great progress in range of motion.  With less pain.  Patient is done getting more fillings and expanders.  Patient to meet with surgeon to establish date for exchange of expanders. Patient continues to have lymphatic cord in the right axilla to upper arm. Unable to release.  But do show improvement with active assisted range of motion. Patient to continue with home program for range of motion.  Pain-free. Circumference for bilateral upper extremities within normal limits. Patient to follow-up with me in 3 weeks.              OT Education - 09/14/22 1040     Education Details progress and changes to HEP    Person(s) Educated Patient    Methods Explanation;Demonstration;Tactile cues;Verbal cues;Handout    Comprehension Verbal cues required;Returned demonstration;Verbalized  understanding                 OT Long Term Goals - 08/30/22 1859       OT LONG TERM GOAL #1   Title Pt bilateral shoulder AROM increase to WNL to pull shirt over head and take objects out of cabinets without increase symptoms    Baseline expanders still in - last week L shoulder flexion ABD was 80 - now this date 140 - R 145 - 2 lymphatic cords in axilla - possible implants in Febr 24    Time 12    Period Weeks    Status New    Target Date 11/22/22  Plan - 09/14/22 1040     Clinical Impression Statement Pt  following up s/p from bilateral mastectomy with expanders put in immediately on 07/26/22 by DR Dillingham. Pt was seen for OT screen 2wks ago  with only 80 degrees of L shoulder AROM - last week showed great progress in AROM - pt return today with great AROM over head for shoulder flexion, ABD and ext rotation with less pain - await now appt for date for exchange of expanders for implants.  Pt do show1 lymphatic cord in R axilla. Pt is L dominant hand. Pt to cont with AAROM for shoulder and follow up in 3 wks . Circumference in bilateral UE WNL - no signs and symptoms -pt is L hand dominant- work form home and mostly on computer    OT Occupational Profile and History Problem Focused Assessment - Including review of records relating to presenting problem    Occupational performance deficits (Please refer to evaluation for details): ADL's;IADL's;Rest and Sleep;Work;Play    Body Structure / Function / Physical Skills ADL;Flexibility;ROM;UE functional use;Pain;IADL;Strength;Scar mobility    Rehab Potential Good    Clinical Decision Making Limited treatment options, no task modification necessary    Comorbidities Affecting Occupational Performance: None    Modification or Assistance to Complete Evaluation  No modification of tasks or assist necessary to complete eval    OT Frequency Biweekly    OT Duration 8 weeks    OT Treatment/Interventions Self-care/ADL  training;Therapeutic exercise;Manual lymph drainage;Patient/family education;Scar mobilization;Manual Therapy    Consulted and Agree with Plan of Care Patient             Patient will benefit from skilled therapeutic intervention in order to improve the following deficits and impairments:   Body Structure / Function / Physical Skills: ADL, Flexibility, ROM, UE functional use, Pain, IADL, Strength, Scar mobility       Visit Diagnosis: Stiffness of left shoulder, not elsewhere classified  Stiffness of right shoulder, not elsewhere classified  S/P mastectomy, bilateral    Problem List Patient Active Problem List   Diagnosis Date Noted   Bilateral breast cancer ([place]) 07/26/2022   H/O total hysterectomy 12/22/2021   Lumbar spondylosis 09/20/2021   Right hip pain 09/20/2021   Skin nodule 06/30/2013    Rosalyn Gess, OTR/L,CLT 09/14/2022, 11:07 AM  [facility] [address] [place], [address] Phone: [phone]   Fax:  [phone]  Name: Marie Nguyen MRN: [id number] Date of Birth: June 05, 1956

## 2022-09-29 ENCOUNTER — Telehealth: Payer: Self-pay | Admitting: *Deleted

## 2022-09-29 NOTE — Telephone Encounter (Signed)
No auth req for [id number] - [id number]

## 2022-10-03 ENCOUNTER — Inpatient Hospital Stay: Payer: BC Managed Care – PPO | Admitting: Occupational Therapy

## 2022-10-03 ENCOUNTER — Encounter: Payer: Self-pay | Admitting: Licensed Clinical Social Worker

## 2022-10-03 ENCOUNTER — Inpatient Hospital Stay: Payer: BC Managed Care – PPO

## 2022-10-03 ENCOUNTER — Inpatient Hospital Stay: Payer: BC Managed Care – PPO | Attending: Oncology | Admitting: Licensed Clinical Social Worker

## 2022-10-03 DIAGNOSIS — Z170 Estrogen receptor positive status [ER+]: Secondary | ICD-10-CM

## 2022-10-03 DIAGNOSIS — Z800 Family history of malignant neoplasm of digestive organs: Secondary | ICD-10-CM

## 2022-10-03 DIAGNOSIS — M25612 Stiffness of left shoulder, not elsewhere classified: Secondary | ICD-10-CM

## 2022-10-03 DIAGNOSIS — C50911 Malignant neoplasm of unspecified site of right female breast: Secondary | ICD-10-CM

## 2022-10-03 DIAGNOSIS — C50912 Malignant neoplasm of unspecified site of left female breast: Secondary | ICD-10-CM | POA: Diagnosis not present

## 2022-10-03 DIAGNOSIS — M25611 Stiffness of right shoulder, not elsewhere classified: Secondary | ICD-10-CM | POA: Insufficient documentation

## 2022-10-03 DIAGNOSIS — Z9013 Acquired absence of bilateral breasts and nipples: Secondary | ICD-10-CM

## 2022-10-03 DIAGNOSIS — Z803 Family history of malignant neoplasm of breast: Secondary | ICD-10-CM

## 2022-10-03 NOTE — Progress Notes (Signed)
REFERRING PROVIDER: Lloyd Huger, MD [place] [address] [place],  [place] [address]  PRIMARY PROVIDER:  Juline Patch, MD  PRIMARY REASON FOR VISIT:  1. Bilateral malignant neoplasm of breast in female, unspecified estrogen receptor status, unspecified site of breast ([place])   2. Family history of breast cancer   3. Family history of colon cancer      HISTORY OF PRESENT ILLNESS:   Marie Nguyen, a 67 y.o. female, was seen for a [place] cancer genetics consultation at the request of Dr. Grayland Ormond due to a personal and family history of cancer.  Marie Nguyen presents to clinic today to discuss the possibility of a hereditary predisposition to cancer, genetic testing, and to further clarify her future cancer risks, as well as potential cancer risks for family members.   In 2023, at the age of 25, Marie Nguyen was diagnosed with bilateral breast cancer, ER/PR+ HER2-. The treatment plan includes bilateral mastectomy in November 2023 and adjuvant antiestrogen therapy.  CANCER HISTORY:  Oncology History  Bilateral breast cancer ([place])  07/26/2022 Initial Diagnosis   Bilateral breast cancer ([place])   08/22/2022 Cancer Staging   Staging form: Breast, AJCC 8th Edition - Pathologic stage from 08/22/2022: Stage IA (pT1c, pN0, cM0, G3, ER+, PR+, HER2-, Oncotype DX score: 14) - Signed by Lloyd Huger, MD on 08/22/2022 Stage prefix: Initial diagnosis Multigene prognostic tests performed: Oncotype DX Recurrence score range: Greater than or equal to 11 Histologic grading system: 3 grade system     Past Medical History:  Diagnosis Date   Allergy    Arthritis    Eczema    GERD (gastroesophageal reflux disease)    Heart murmur    Hypertension     Past Surgical History:  Procedure Laterality Date   ABDOMINAL HYSTERECTOMY     AUGMENTATION MAMMAPLASTY     AXILLARY SENTINEL NODE BIOPSY Bilateral 07/26/2022   Procedure: AXILLARY SENTINEL NODE BIOPSY;  Surgeon: Olean Ree, MD;   Location: [facility] ORS;  Service: General;  Laterality: Bilateral;   BREAST BIOPSY Right    neg   BREAST BIOPSY Right 05/16/2022   x 2 areas/ [place] bx & Stereo bx coil clip/INC and DCIS   BREAST BIOPSY Left 05/16/2022   [place] bx/ [facility] with DCIS   BREAST RECONSTRUCTION WITH PLACEMENT OF TISSUE EXPANDER AND ALLODERM Bilateral 07/26/2022   Procedure: BREAST RECONSTRUCTION WITH PLACEMENT OF TISSUE EXPANDER AND ALLODERM;  Surgeon: Wallace Going, DO;  Location: [facility] ORS;  Service: Plastics;  Laterality: Bilateral;   CESAREAN SECTION     COLONOSCOPY WITH PROPOFOL N/A 11/24/2018   Procedure: COLONOSCOPY WITH PROPOFOL;  Surgeon: Jonathon Bellows, MD;  Location: [facility] ENDOSCOPY;  Service: Gastroenterology;  Laterality: N/A;   COLONOSCOPY WITH PROPOFOL N/A 07/03/2021   Procedure: COLONOSCOPY WITH PROPOFOL;  Surgeon: Jonathon Bellows, MD;  Location: [facility] ENDOSCOPY;  Service: Gastroenterology;  Laterality: N/A;   GANGLION CYST EXCISION Right    wrist   MASTECTOMY Bilateral    SIMPLE MASTECTOMY WITH AXILLARY SENTINEL NODE BIOPSY Bilateral 07/26/2022   Procedure: SIMPLE MASTECTOMY, Otho Ket, PA-C to assist;  Surgeon: Olean Ree, MD;  Location: [facility] ORS;  Service: General;  Laterality: Bilateral;   TOTAL ABDOMINAL HYSTERECTOMY      FAMILY HISTORY:  We obtained a detailed, 4-generation family history.  Significant diagnoses are listed below: Family History  Problem Relation Age of Onset   Breast cancer Mother 15   Hypertension Mother    Diabetes Father    Hypertension Father  Stroke Father    Hypertension Sister    Colon cancer Maternal Grandfather 12   Marie Nguyen has 2 daughters, 25 and 33, no cancers. She has 1 sister, 48 and 1 brother, 27, no cancers.  Marie Nguyen mother had breast cancer around age 2 and is currently living at 19 with dementia. Maternal grandfather had colon cancer in his 61s and passed in his 4s.  Marie Nguyen father is living at 25, no cancers on this side of the  family.  Marie Nguyen is unaware of previous family history of genetic testing for hereditary cancer risks. There is no reported Ashkenazi Jewish ancestry. There is no known consanguinity.    GENETIC COUNSELING ASSESSMENT: Marie Nguyen is a 67 y.o. female with a personal and family history of breast cancer which is somewhat suggestive of a hereditary cancer syndrome and predisposition to cancer. We, therefore, discussed and recommended the following at today's visit.   DISCUSSION: We discussed that approximately 10% of breast cancer is hereditary. Most cases of hereditary breast cancer are associated with BRCA1/BRCA2 genes, although there are other genes associated with hereditary breast cancer as well. Cancers and risks are gene specific. We discussed that testing is beneficial for several reasons including knowing about cancer risks, identifying potential screening and risk-reduction options that may be appropriate, and to understand if other family members could be at risk for cancer and allow them to undergo genetic testing.   We reviewed the characteristics, features and inheritance patterns of hereditary cancer syndromes. We also discussed genetic testing, including the appropriate family members to test, the process of testing, insurance coverage and turn-around-time for results. We discussed the implications of a negative, positive and/or variant of uncertain significant result. We recommended Marie Nguyen pursue genetic testing for the [facility] CancerNext-Expanded+RNA gene panel.   Based on Marie Nguyen personal and family history of cancer, she meets medical criteria for genetic testing. Despite that she meets criteria, she may still have an out of pocket cost. We discussed that if her out of pocket cost for testing is over $100, the laboratory will call and confirm whether she wants to proceed with testing.  If the out of pocket cost of testing is less than $100 she will be billed by the  genetic testing laboratory.   PLAN: After considering the risks, benefits, and limitations, Marie Nguyen provided informed consent to pursue genetic testing and the blood sample was sent to [facility] [place] for analysis of the CancerNext-Expanded+RNA panel. Results should be available within approximately 2-3 weeks' time, at which point they will be disclosed by telephone to Marie Nguyen, as will any additional recommendations warranted by these results. Marie Nguyen will receive a summary of her genetic counseling visit and a copy of her results once available. This information will also be available in Epic.   Marie Nguyen questions were answered to her satisfaction today. Our contact information was provided should additional questions or concerns arise. Thank you for the referral and allowing [place] to share in the care of your patient.   Marie Rogue, MS, [facility] Genetic Counselor [email or web address] Phone: [phone]  The patient was seen for a total of 30 minutes in face-to-face genetic counseling.  Patient's sister Karna Christmas was also present. Dr. Grayland Ormond was available for discussion regarding this case.   _______________________________________________________________________ For Office Staff:  Number of people involved in session: 2 Was an Intern/ student involved with case: no

## 2022-10-03 NOTE — Therapy (Signed)
[facility] [address] [place], [address] Phone: [phone]   Fax:  [phone]  Occupational Therapy Treatment  Patient Details  Name: Marie Nguyen MRN: [id number] Date of Birth: May 09, 1956 No data recorded  Encounter Date: 10/03/2022   OT End of Session - 10/03/22 1642     Visit Number 3    Number of Visits 6    Date for OT Re-Evaluation 11/22/22    OT Start Time 1028    OT Stop Time 1046    OT Time Calculation (min) 18 min    Activity Tolerance Patient tolerated treatment well    Behavior During Therapy WFL for tasks assessed/performed             Past Medical History:  Diagnosis Date   Allergy    Arthritis    Eczema    GERD (gastroesophageal reflux disease)    Heart murmur    Hypertension     Past Surgical History:  Procedure Laterality Date   ABDOMINAL HYSTERECTOMY     AUGMENTATION MAMMAPLASTY     AXILLARY SENTINEL NODE BIOPSY Bilateral 07/26/2022   Procedure: AXILLARY SENTINEL NODE BIOPSY;  Surgeon: Olean Ree, MD;  Location: [facility] ORS;  Service: General;  Laterality: Bilateral;   BREAST BIOPSY Right    neg   BREAST BIOPSY Right 05/16/2022   x 2 areas/ [place] bx & Stereo bx coil clip/INC and DCIS   BREAST BIOPSY Left 05/16/2022   [place] bx/ [facility] with DCIS   BREAST RECONSTRUCTION WITH PLACEMENT OF TISSUE EXPANDER AND ALLODERM Bilateral 07/26/2022   Procedure: BREAST RECONSTRUCTION WITH PLACEMENT OF TISSUE EXPANDER AND ALLODERM;  Surgeon: Wallace Going, DO;  Location: [facility] ORS;  Service: Plastics;  Laterality: Bilateral;   CESAREAN SECTION     COLONOSCOPY WITH PROPOFOL N/A 11/24/2018   Procedure: COLONOSCOPY WITH PROPOFOL;  Surgeon: Jonathon Bellows, MD;  Location: [facility] ENDOSCOPY;  Service: Gastroenterology;  Laterality: N/A;   COLONOSCOPY WITH PROPOFOL N/A 07/03/2021   Procedure: COLONOSCOPY WITH PROPOFOL;  Surgeon: Jonathon Bellows, MD;  Location: [facility] ENDOSCOPY;  Service:  Gastroenterology;  Laterality: N/A;   GANGLION CYST EXCISION Right    wrist   MASTECTOMY Bilateral    SIMPLE MASTECTOMY WITH AXILLARY SENTINEL NODE BIOPSY Bilateral 07/26/2022   Procedure: SIMPLE MASTECTOMY, Otho Ket, PA-C to assist;  Surgeon: Olean Ree, MD;  Location: [facility] ORS;  Service: General;  Laterality: Bilateral;   TOTAL ABDOMINAL HYSTERECTOMY      There were no vitals filed for this visit.   Subjective Assessment - 10/03/22 1639     Pertinent History 08/23/22 PA surgery note - Patient is a 67 year old female with history of breast cancer.  She is status post bilateral mastectomy with immediate reconstruction using tissue expanders and AlloDerm performed by Dr. Marla Roe on 07/26/2022.  Patient had 455 cc expanders placed bilaterally.  Patient presents to the clinic today for postoperative follow-up.     Patient was last seen in the clinic on 08/20/2022.  At this visit, patient reported she was doing well.  She reported that she wanted one last fill.  On exam, expanders were in place and fairly symmetric. To the left breast incision, there appears to be a suture protruding from the skin that was causing some irritation to the skin.  The suture was removed.  The pinpoint wound near this suture protrusion appears to be superficial.  She has been doing Vaseline to the left breast wound.  This has been going  well.  She is not having any issues.  She reports today that she is happy with the size of bilateral breast, reports her goal was about a B cup.     Chaperone present on exam  On exam bilateral breast incisions are intact and healing well.  She does have a superficial abrasion of the left medial breast that is approximately 1.5 x 1 mm.  No surrounding erythema or cellulitic changes.  The depth of the wound is approximately 0.5 mm.  Bilateral breasts are soft, no subcutaneous fluid collection noted palpation.     Assessment/plan:     67 year old female status post bilateral mastectomy with  immediate reconstruction placement of tissue expanders and Flex HD with Dr. Marla Roe on 07/26/2022.She is doing well with expansion, she is happy with the current size.  She does not want any additional fills.  The area of concern has improved.  She does have a small superficial abrasion of the left breast still present but the depth is very minimal approximately 0.5 mm.  Recommend Vaseline and gauze to the superficial abrasion until it has completely healed.     We discussed the option of silicone versus saline implants, discussed the pros and cons of each.  She seems to be leaning towards silicone but I encouraged her to think this over and we can discuss this further at her next appointment.    Patient Stated Goals Want to get my motion and strength in both arms back and get my silicon implants    Currently in Pain? Yes    Pain Score 1    1st dorsal compartment   Pain Orientation Right    Pain Descriptors / Indicators Tender                 LYMPHEDEMA/ONCOLOGY QUESTIONNAIRE - 10/03/22 0001       Right Upper Extremity Lymphedema   15 cm Proximal to Olecranon Process 26.5 cm    10 cm Proximal to Olecranon Process 24.5 cm    Olecranon Process 23.6 cm    15 cm Proximal to Ulnar Styloid Process 22 cm    10 cm Proximal to Ulnar Styloid Process 19 cm    Just Proximal to Ulnar Styloid Process 14.5 cm    Across Hand at [employer] 18.5 cm      Left Upper Extremity Lymphedema   15 cm Proximal to Olecranon Process 26.3 cm    10 cm Proximal to Olecranon Process 25.3 cm    Olecranon Process 24 cm    15 cm Proximal to Ulnar Styloid Process 22.2 cm    10 cm Proximal to Ulnar Styloid Process 19 cm    Just Proximal to Ulnar Styloid Process 14.2 cm    Across Hand at [employer] 18.5 cm             Patient scheduled to meet with surgeon on 10/09/2022 for a possible date for removal of expanders and implants put in. Bilateral shoulder active range of motion is within normal limits  in all planes.  With no pain just some tightness. Cording in right axilla improved greatly as well as scar tissue. Patient report increased swelling in the right wrist last week and at times in the morning. Upon further assessment circumference compared to the left within normal limits except at the wrist.  Patient do have a history of a ganglion cyst removal. But also positive test for [facility] and tenderness over distal radius. 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 Patient to follow-up with me after neck surgery to assess range of motion.                OT Education - 10/03/22 1642     Education Details progress and POC    Person(s) Educated Patient    Methods Explanation;Demonstration;Tactile cues;Verbal cues;Handout    Comprehension Verbal cues required;Returned demonstration;Verbalized understanding                 OT Long Term Goals - 10/03/22 1648       OT LONG TERM GOAL #1   Title Pt bilateral shoulder AROM increase to WNL to pull shirt over head and take objects out of cabinets without increase symptoms    Baseline AROM in bilateral shoulders WNL but pt going to have expanders remove and implants put in    Time 6    Period Weeks    Status On-going    Target Date 11/22/22                   Plan - 10/03/22 1642     Clinical Impression Statement Pt  following up s/p from bilateral mastectomy with expanders put in immediately on 07/26/22 by DR Dillingham.  Pt from [facility] done great progress in bilateral shoulder AROM - over head for shoulder flexion, ABD and ext rotation WNL and no pain. Pt has appt on 10/09/22 with surgeon about plan for exchange of expanders for implants.  Pt lymphatic cord in R axilla improved greatly and scar tissue. Circumference compare to the L WNL - except  wrist -but pt this date report some discomfort the last week over radial and ulnar wrist. Upon assessment pt positive Finkelstein test and tenderness over R distal radius. Review with pt possible activities for cause - only one could be carrying or lifting pocket book on shoulder or computer bag. Pt do have hx of removal of ganglion cyst on R wrist.  Pt is L dominant hand.  Pt to cont with ROM -and will follow up with me after her next surgery.    OT Occupational Profile and History Problem Focused Assessment - Including review of records relating to presenting problem    Occupational performance deficits (Please refer to evaluation for details): ADL's;IADL's;Rest and Sleep;Work;Play    Body Structure / Function / Physical Skills ADL;Flexibility;ROM;UE functional use;Pain;IADL;Strength;Scar mobility    Rehab Potential Good    Clinical Decision Making Limited treatment options, no task modification necessary    Comorbidities Affecting Occupational Performance: None    Modification or Assistance to Complete Evaluation  No modification of tasks or assist necessary to complete eval    OT Frequency Monthly    OT Duration 6 weeks    OT Treatment/Interventions Self-care/ADL training;Therapeutic exercise;Manual lymph drainage;Patient/family education;Scar mobilization;Manual Therapy    Consulted and Agree with Plan of Care Patient             Patient will benefit from skilled therapeutic intervention in order to improve the following deficits and impairments:   Body Structure / Function / Physical Skills: ADL, Flexibility, ROM, UE functional use, Pain, IADL, Strength, Scar mobility       Visit Diagnosis: Stiffness of left shoulder, not elsewhere classified  Stiffness of right shoulder, not elsewhere classified  S/P mastectomy, bilateral  Problem List Patient Active Problem List   Diagnosis Date Noted   Bilateral breast cancer ([place]) 07/26/2022   H/O total hysterectomy 12/22/2021    Lumbar spondylosis 09/20/2021   Right hip pain 09/20/2021   Skin nodule 06/30/2013    Rosalyn Gess, OTR/L,CLT 10/03/2022, 4:50 PM  [facility] [address], [place], [address] Phone: [phone]   Fax:  [phone]  Name: Marie Nguyen MRN: [id number] Date of Birth: [birth year]

## 2022-10-09 ENCOUNTER — Ambulatory Visit (INDEPENDENT_AMBULATORY_CARE_PROVIDER_SITE_OTHER): Payer: BC Managed Care – PPO | Admitting: Plastic Surgery

## 2022-10-09 ENCOUNTER — Encounter: Payer: Self-pay | Admitting: Plastic Surgery

## 2022-10-09 VITALS — BP 157/90 | HR 66

## 2022-10-09 DIAGNOSIS — C50912 Malignant neoplasm of unspecified site of left female breast: Secondary | ICD-10-CM

## 2022-10-09 DIAGNOSIS — C50911 Malignant neoplasm of unspecified site of right female breast: Secondary | ICD-10-CM

## 2022-10-09 NOTE — Progress Notes (Signed)
   Subjective:    Patient ID: Marie Nguyen, female    DOB: [birth year], 67 y.o.   MRN: [id number]  The patient is a 67 year old female here for follow-up after undergoing bilateral mastectomies in November with reconstruction.  She has 455 cc expanders in place and currently has 325 cc in the expander.  She is pleased with the size and would like to move forward with exchange.  She is not on any chemotherapy and radiation is not planned.      Review of Systems  Constitutional: Negative.   Eyes: Negative.   Respiratory: Negative.    Cardiovascular: Negative.   Gastrointestinal: Negative.   Endocrine: Negative.   Genitourinary: Negative.   Musculoskeletal: Negative.        Objective:   Physical Exam Constitutional:      Appearance: Normal appearance.  Cardiovascular:     Rate and Rhythm: Normal rate.     Pulses: Normal pulses.  Skin:    Capillary Refill: Capillary refill takes less than 2 seconds.  Neurological:     Mental Status: She is alert and oriented to person, place, and time.  Psychiatric:        Mood and Affect: Mood normal.        Behavior: Behavior normal.        Thought Content: Thought content normal.        Assessment & Plan:     ICD-10-CM   1. Bilateral malignant neoplasm of breast in female, unspecified estrogen receptor status, unspecified site of breast ([place])  C50.911    C50.912       We will move forward with exchange.  I will put the order in for the implants.  Plan for bilateral removal of expanders and placement of silicone implants.  Pictures were obtained of the patient and placed in the chart with the patient's or guardian's permission.

## 2022-10-18 ENCOUNTER — Telehealth: Payer: Self-pay | Admitting: Licensed Clinical Social Worker

## 2022-10-23 ENCOUNTER — Encounter: Payer: Self-pay | Admitting: Licensed Clinical Social Worker

## 2022-10-23 ENCOUNTER — Ambulatory Visit: Payer: Self-pay | Admitting: Licensed Clinical Social Worker

## 2022-10-23 DIAGNOSIS — Z1379 Encounter for other screening for genetic and chromosomal anomalies: Secondary | ICD-10-CM | POA: Insufficient documentation

## 2022-10-23 NOTE — Progress Notes (Signed)
HPI:   [name] was previously seen in the [place] clinic due to a personal and family history of cancer and concerns regarding a hereditary predisposition to cancer. Please refer to our prior cancer genetics clinic note for more information regarding our discussion, assessment and recommendations, at the time. [name] recent genetic test results were disclosed to her, as were recommendations warranted by these results. These results and recommendations are discussed in more detail below.  CANCER HISTORY:  Oncology History  Bilateral breast cancer ([place])  07/26/2022 Initial Diagnosis   Bilateral breast cancer ([place])   08/22/2022 Cancer Staging   Staging form: Breast, AJCC 8th Edition - Pathologic stage from 08/22/2022: Stage IA (pT1c, pN0, cM0, G3, ER+, PR+, HER2-, Oncotype DX score: 14) - Signed by Lloyd Huger, MD on 08/22/2022 Stage prefix: Initial diagnosis Multigene prognostic tests performed: Oncotype DX Recurrence score range: Greater than or equal to 11 Histologic grading system: 3 grade system    Genetic Testing   Negative genetic testing. No pathogenic variants identified on the [facility] CancerNext-Expanded+RNA panel. VUS in CDK4 called p.Y17H, VUS in CHEK2 called EX2_3'UTRdup, and VUS in GALNT12 called p.F559L identified. The report date is 10/17/2022.  The CancerNext-Expanded + RNAinsight gene panel offered by [employer] and includes sequencing and rearrangement analysis for the following 77 genes: IP, ALK, APC*, ATM*, AXIN2, BAP1, BARD1, BLM, BMPR1A, BRCA1*, BRCA2*, BRIP1*, CDC73, CDH1*,CDK4, CDKN1B, CDKN2A, CHEK2*, CTNNA1, DICER1, FANCC, FH, FLCN, GALNT12, KIF1B, LZTR1, MAX, MEN1, MET, MLH1*, MSH2*, MSH3, MSH6*, MUTYH*, NBN, NF1*, NF2, NTHL1, PALB2*, PHOX2B, PMS2*, POT1, PRKAR1A, PTCH1, PTEN*, RAD51C*, RAD51D*,RB1, RECQL, RET, SDHA, SDHAF2, SDHB, SDHC, SDHD, SMAD4, SMARCA4, SMARCB1, SMARCE1, STK11, SUFU, TMEM127, TP53*,TSC1, TSC2, VHL and XRCC2  (sequencing and deletion/duplication); EGFR, EGLN1, HOXB13, KIT, MITF, PDGFRA, POLD1 and POLE (sequencing only); EPCAM and GREM1 (deletion/duplication only).     FAMILY HISTORY:  We obtained a detailed, 4-generation family history.  Significant diagnoses are listed below: Family History  Problem Relation Age of Onset   Breast cancer Mother 25   Hypertension Mother    Diabetes Father    Hypertension Father    Stroke Father    Hypertension Sister    Colon cancer Maternal Grandfather 4   [name] has 2 daughters, 77 and 54, no cancers. She has 1 sister, 22 and 1 brother, 65, no cancers.   [name] mother had breast cancer around age 28 and is currently living at 38 with dementia. Maternal grandfather had colon cancer in his 39s and passed in his 68s.   [name] father is living at 100, no cancers on this side of the family.   [name] is unaware of previous family history of genetic testing for hereditary cancer risks. There is no reported Ashkenazi Jewish ancestry. There is no known consanguinity.      GENETIC TEST RESULTS:  The [facility] CancerNext-Expanded+RNA Panel found no pathogenic mutations.   The CancerNext-Expanded + RNAinsight gene panel offered by [employer] and includes sequencing and rearrangement analysis for the following 77 genes: IP, ALK, APC*, ATM*, AXIN2, BAP1, BARD1, BLM, BMPR1A, BRCA1*, BRCA2*, BRIP1*, CDC73, CDH1*,CDK4, CDKN1B, CDKN2A, CHEK2*, CTNNA1, DICER1, FANCC, FH, FLCN, GALNT12, KIF1B, LZTR1, MAX, MEN1, MET, MLH1*, MSH2*, MSH3, MSH6*, MUTYH*, NBN, NF1*, NF2, NTHL1, PALB2*, PHOX2B, PMS2*, POT1, PRKAR1A, PTCH1, PTEN*, RAD51C*, RAD51D*,RB1, RECQL, RET, SDHA, SDHAF2, SDHB, SDHC, SDHD, SMAD4, SMARCA4, SMARCB1, SMARCE1, STK11, SUFU, TMEM127, TP53*,TSC1, TSC2, VHL and XRCC2 (sequencing and deletion/duplication); EGFR, EGLN1, HOXB13, KIT, MITF, PDGFRA, POLD1 and POLE (sequencing only);  EPCAM and GREM1 (deletion/duplication only).   The test report  has been scanned into EPIC and is located under the Molecular Pathology section of the Results Review tab.  A portion of the result report is included below for reference. Genetic testing reported out on 10/17/2022.      Genetic testing identified a variant of uncertain significance (VUS) in the CDK4, CHEK2 and GALNT12  genes.  At this time, it is unknown if this variant is associated with an increased risk for cancer or if it is benign, but most uncertain variants are reclassified to benign. It should not be used to make medical management decisions. With time, we suspect the laboratory will determine the significance of this variant, if any. If the laboratory reclassifies this variant, we will attempt to contact [name] to discuss it further.   Even though a pathogenic variant was not identified, possible explanations for the cancer in the family may include: There may be no hereditary risk for cancer in the family. The cancers in [name] and/or her family may be sporadic/familial or due to other genetic and environmental factors. There may be a gene mutation in one of these genes that current testing methods cannot detect but that chance is small. There could be another gene that has not yet been discovered, or that we have not yet tested, that is responsible for the cancer diagnoses in the family.  It is also possible there is a hereditary cause for the cancer in the family that [name] did not inherit. The variant of uncertain significance detected in the CDK4, CHEK2 or GALNT12 gene may be reclassified as a pathogenic variant in the future. At this time, we do not know if this variant increases the risk for cancer.  Therefore, it is important to remain in touch with cancer genetics in the future so that we can continue to offer [name] the most up to date genetic testing.   ADDITIONAL GENETIC TESTING:  We discussed with [name] that her genetic testing was fairly  extensive.  If there are additional relevant genes identified to increase cancer risk that can be analyzed in the future, we would be happy to discuss and coordinate this testing at that time.    CANCER SCREENING RECOMMENDATIONS:  [name] test result is considered negative (normal).  This means that we have not identified a hereditary cause for her personal and family history of cancer at this time.   An individual's cancer risk and medical management are not determined by genetic test results alone. Overall cancer risk assessment incorporates additional factors, including personal medical history, family history, and any available genetic information that may result in a personalized plan for cancer prevention and surveillance. Therefore, it is recommended she continue to follow the cancer management and screening guidelines provided by her oncology and primary healthcare provider.  RECOMMENDATIONS FOR FAMILY MEMBERS:   Since she did not inherit a identifiable mutation in a cancer predisposition gene included on this panel, her children could not have inherited a known mutation from her in one of these genes. Individuals in this family might be at some increased risk of developing cancer, over the general population risk, due to the family history of cancer.  Individuals in the family should notify their providers of the family history of cancer. We recommend women in this family have a yearly mammogram beginning at age 60, or 24 years younger than the earliest onset of cancer, an annual clinical breast exam, and  perform monthly breast self-exams.  Family members should have colonoscopies by at age 20, or earlier, as recommended by their providers.  We do not recommend familial testing for the CDK4, CHEK2 or BEQUH48 variant of uncertain significance (VUS).  FOLLOW-UP:  Lastly, we discussed with [name] that cancer genetics is a rapidly advancing field and it is possible that new genetic  tests will be appropriate for her and/or her family members in the future. We encouraged her to remain in contact with cancer genetics on an annual basis so we can update her personal and family histories and let her know of advances in cancer genetics that may benefit this family.   Our contact number was provided. [name] questions were answered to her satisfaction, and she knows she is welcome to call us at anytime with additional questions or concerns.    Faith Rogue, MS, [facility] Genetic Counselor [email or web address] Phone: [phone]

## 2022-10-23 NOTE — Telephone Encounter (Signed)
I contacted Ms. Denardo to discuss her genetic testing results. No pathogenic variants were identified in the 77 genes analyzed. Detailed clinic note to follow.   The test report has been scanned into EPIC and is located under the Molecular Pathology section of the Results Review tab.  A portion of the result report is included below for reference.     Faith Rogue, MS, [facility] Genetic Counselor [email or web address] Phone: [phone]

## 2022-10-29 ENCOUNTER — Telehealth: Payer: Self-pay | Admitting: Plastic Surgery

## 2022-10-29 NOTE — Telephone Encounter (Signed)
Please call pt to schedule surgery . per Dr. Marla Roe schedule before March 1 due to pt year copays and deductible start over on that date and she is also going to [place] as soon as she has a date set for the surgery .

## 2022-11-02 ENCOUNTER — Encounter: Payer: BC Managed Care – PPO | Admitting: Student

## 2022-11-05 ENCOUNTER — Ambulatory Visit (INDEPENDENT_AMBULATORY_CARE_PROVIDER_SITE_OTHER): Payer: BC Managed Care – PPO | Admitting: Surgical

## 2022-11-05 ENCOUNTER — Encounter: Payer: Self-pay | Admitting: Student

## 2022-11-05 VITALS — BP 151/96 | HR 72 | Ht 67.0 in | Wt 133.2 lb

## 2022-11-05 DIAGNOSIS — C50911 Malignant neoplasm of unspecified site of right female breast: Secondary | ICD-10-CM

## 2022-11-05 DIAGNOSIS — Z9889 Other specified postprocedural states: Secondary | ICD-10-CM

## 2022-11-05 DIAGNOSIS — C50811 Malignant neoplasm of overlapping sites of right female breast: Secondary | ICD-10-CM

## 2022-11-05 MED ORDER — CEPHALEXIN 500 MG PO CAPS: 500.0000 mg | ORAL_CAPSULE | Freq: Four times a day (QID) | ORAL | 0 refills | Status: AC

## 2022-11-05 MED ORDER — OXYCODONE HCL 5 MG PO TABS: 5.0000 mg | ORAL_TABLET | Freq: Four times a day (QID) | ORAL | 0 refills | Status: AC | PRN

## 2022-11-05 NOTE — H&P (View-Only) (Signed)
Patient ID: Marie Nguyen, female    DOB: 10/06/1955, 67 y.o.   MRN: [id number]  Chief Complaint  Patient presents with   Pre-op Exam      ICD-10-CM   1. Bilateral malignant neoplasm of breast in female, unspecified estrogen receptor status, unspecified site of breast ([place])  C50.911    C50.912     2. S/P breast reconstruction  Z98.890     3. Malignant neoplasm of right female breast, unspecified estrogen receptor status, unspecified site of breast ([place])  C50.911     4. Cancer of overlapping sites of right breast [facility])  C50.811       History of Present Illness: Marie Nguyen is a 67 y.o.  female  with a history of breast cancer.  She presents for preoperative evaluation for upcoming procedure, removal of bilateral tissue expanders and placement of implants, scheduled for 11/14/2022 with Dr. Marla Roe.  The patient has not had problems with anesthesia. No history of DVT/PE.  No family history of DVT/PE.  No family or personal history of bleeding or clotting disorders.  Patient is not currently taking any blood thinners.  No history of CVA/MI.   Summary of Previous Visit: Patient had bilateral mastectomies in November with immediate reconstruction, has 325 cc/455 cc expanders.  She has not had any chemotherapy or radiation.  Job: Works for [employer], computer-based.  Has the option to work from home.  We will plan about 10 days out of work, may be able to return sooner.  PMH Significant for: GERD, hypertension, history of bilateral mastectomy with immediate breast reconstruction.   Past Medical History: Allergies: Allergies  Allergen Reactions   Latex Rash    Current Medications:  Current Outpatient Medications:    acetaminophen (TYLENOL) 500 MG tablet, Take 1,000 mg by mouth every 6 (six) hours as needed for mild pain., Disp: , Rfl:    azelastine (ASTELIN) 0.1 % nasal spray, Place 1 spray into both nostrils daily. Use in each nostril as directed, Disp: ,  Rfl:    cephALEXin (KEFLEX) 500 MG capsule, Take 1 capsule (500 mg total) by mouth 4 (four) times daily for 5 days., Disp: 20 capsule, Rfl: 0   diazepam (VALIUM) 2 MG tablet, Take 1 tablet (2 mg total) by mouth every 12 (twelve) hours as needed for muscle spasms., Disp: 20 tablet, Rfl: 0   fluticasone (FLONASE) 50 MCG/ACT nasal spray, Place 1 spray into both nostrils daily as needed for allergies or rhinitis., Disp: , Rfl:    ibuprofen (ADVIL) 200 MG tablet, Take 200 mg by mouth every 6 (six) hours as needed for mild pain. 2 - 3 tabs, Disp: , Rfl:    letrozole (FEMARA) 2.5 MG tablet, Take 1 tablet (2.5 mg total) by mouth daily., Disp: 90 tablet, Rfl: 3   lisinopril-hydrochlorothiazide (ZESTORETIC) 10-12.5 MG tablet, Take 1 tablet by mouth daily., Disp: 90 tablet, Rfl: 1   Multiple Vitamin (MULTIVITAMIN) tablet, Take 1 tablet by mouth daily., Disp: , Rfl:    Omega-3 Fatty Acids (FISH OIL) 1000 MG CAPS, Take 1 capsule by mouth as needed., Disp: , Rfl:    omeprazole (PRILOSEC) 20 MG capsule, Take 20 mg by mouth daily., Disp: , Rfl:    oxyCODONE (OXY IR/ROXICODONE) 5 MG immediate release tablet, Take 1 tablet (5 mg total) by mouth every 6 (six) hours as needed for up to 5 days for severe pain., Disp: 20 tablet, Rfl: 0   traMADol (ULTRAM) 50 MG tablet, Take  1 tablet (50 mg total) by mouth every 6 (six) hours as needed., Disp: 30 tablet, Rfl: 0   triamcinolone cream (KENALOG) 0.1 %, 1 Application as needed., Disp: , Rfl:    lidocaine-prilocaine (EMLA) cream, Apply to the areola of both breasts and cover with plastic wrap one hour prior to leaving for surgery., Disp: 5 g, Rfl: 0   ondansetron (ZOFRAN) 4 MG tablet, Take 1 tablet (4 mg total) by mouth every 8 (eight) hours as needed for nausea or vomiting., Disp: 20 tablet, Rfl: 0  Past Medical Problems: Past Medical History:  Diagnosis Date   Allergy    Arthritis    Eczema    GERD (gastroesophageal reflux disease)    Heart murmur    Hypertension      Past Surgical History: Past Surgical History:  Procedure Laterality Date   ABDOMINAL HYSTERECTOMY     AUGMENTATION MAMMAPLASTY     AXILLARY SENTINEL NODE BIOPSY Bilateral 07/26/2022   Procedure: AXILLARY SENTINEL NODE BIOPSY;  Surgeon: Olean Ree, MD;  Location: [facility] ORS;  Service: General;  Laterality: Bilateral;   BREAST BIOPSY Right    neg   BREAST BIOPSY Right 05/16/2022   x 2 areas/ [place] bx & Stereo bx coil clip/INC and DCIS   BREAST BIOPSY Left 05/16/2022   [place] bx/ [facility] with DCIS   BREAST RECONSTRUCTION WITH PLACEMENT OF TISSUE EXPANDER AND ALLODERM Bilateral 07/26/2022   Procedure: BREAST RECONSTRUCTION WITH PLACEMENT OF TISSUE EXPANDER AND ALLODERM;  Surgeon: Wallace Going, DO;  Location: [facility] ORS;  Service: Plastics;  Laterality: Bilateral;   CESAREAN SECTION     COLONOSCOPY WITH PROPOFOL N/A 11/24/2018   Procedure: COLONOSCOPY WITH PROPOFOL;  Surgeon: Jonathon Bellows, MD;  Location: [facility] ENDOSCOPY;  Service: Gastroenterology;  Laterality: N/A;   COLONOSCOPY WITH PROPOFOL N/A 07/03/2021   Procedure: COLONOSCOPY WITH PROPOFOL;  Surgeon: Jonathon Bellows, MD;  Location: [facility] ENDOSCOPY;  Service: Gastroenterology;  Laterality: N/A;   GANGLION CYST EXCISION Right    wrist   MASTECTOMY Bilateral    SIMPLE MASTECTOMY WITH AXILLARY SENTINEL NODE BIOPSY Bilateral 07/26/2022   Procedure: SIMPLE MASTECTOMY, Otho Ket, PA-C to assist;  Surgeon: Olean Ree, MD;  Location: [facility] ORS;  Service: General;  Laterality: Bilateral;   TOTAL ABDOMINAL HYSTERECTOMY      Social History: Social History   Socioeconomic History   Marital status: Divorced    Spouse name: Not on file   Number of children: 2   Years of education: Not on file   Highest education level: Not on file  Occupational History   Not on file  Tobacco Use   Smoking status: Never    Passive exposure: Never   Smokeless tobacco: Never  Vaping Use   Vaping Use: Never used  Substance and Sexual Activity   Alcohol  use: Yes    Comment: rarely   Drug use: Never   Sexual activity: Yes    Partners: Male  Other Topics Concern   Not on file  Social History Narrative   Lives alone   Social Determinants of Health   Financial Resource Strain: Low Risk  (06/15/2022)   Overall Financial Resource Strain (CARDIA)    Difficulty of Paying Living Expenses: Not very hard  Food Insecurity: No Food Insecurity (07/26/2022)   Hunger Vital Sign    Worried About Running Out of Food in the Last Year: Never true    Ran Out of Food in the Last Year: Never true  Transportation Needs: No Transportation Needs (07/26/2022)  PRAPARE - Hydrologist (Medical): No    Lack of Transportation (Non-Medical): No  Physical Activity: Sufficiently Active (06/15/2022)   Exercise Vital Sign    Days of Exercise per Week: 5 days    Minutes of Exercise per Session: 30 min  Stress: No Stress Concern Present (06/15/2022)   [place]    Feeling of Stress : Only a little  Social Connections: Moderately Integrated (06/15/2022)   Social Connection and Isolation Panel [NHANES]    Frequency of Communication with Friends and Family: More than three times a week    Frequency of Social Gatherings with Friends and Family: More than three times a week    Attends Religious Services: 1 to 4 times per year    Active Member of [employer] or Organizations: Yes    Attends Archivist Meetings: 1 to 4 times per year    Marital Status: Divorced  Human resources officer Violence: Not At Risk (07/26/2022)   Humiliation, Afraid, Rape, and Kick questionnaire    Fear of Current or Ex-Partner: No    Emotionally Abused: No    Physically Abused: No    Sexually Abused: No    Family History: Family History  Problem Relation Age of Onset   Breast cancer Mother 72   Hypertension Mother    Diabetes Father    Hypertension Father    Stroke Father    Hypertension Sister     Colon cancer Maternal Grandfather 56    Review of Systems: ROS  Physical Exam: Vital Signs BP (!) 151/96 (BP Location: Left Arm, Patient Position: Sitting, Cuff Size: Normal)   Pulse 72   Ht 5' 7"$  (1.702 m)   Wt 133 lb 3.2 oz (60.4 kg)   SpO2 98%   BMI 20.86 kg/m   Physical Exam Constitutional:      General: Not in acute distress.    Appearance: Normal appearance. Not ill-appearing.  HENT:     Head: Normocephalic and atraumatic.  Eyes:     Pupils: Pupils are equal, round Neck:     Musculoskeletal: Normal range of motion.  Cardiovascular:     Rate and Rhythm: Normal rate    Pulses: Normal pulses.  Pulmonary:     Effort: Pulmonary effort is normal. No respiratory distress.  Abdominal:     General: Abdomen is flat. There is no distension.  Musculoskeletal: Normal range of motion.  Skin:    General: Skin is warm and dry.     Findings: No erythema or rash.  Neurological:     General: No focal deficit present.     Mental Status: Alert and oriented to person, place, and time. Mental status is at baseline.     Motor: No weakness.  Psychiatric:        Mood and Affect: Mood normal.        Behavior: Behavior normal.    Assessment/Plan: The patient is scheduled for exchange of bilateral breast tissue expanders for silicone breast implants with Dr. Marla Roe.  Risks, benefits, and alternatives of procedure discussed, questions answered and consent obtained.    Smoking Status: Non-smoker; Counseling Given?  N/A Last Mammogram: Status post mastectomy  Caprini Score: 6; Risk Factors include: Age, breast cancer, and length of planned surgery. Recommendation for mechanical prophylaxis. Encourage early ambulation.   Pictures obtained: Pictures taken a few weeks ago at a previous visit  Post-op Rx sent to pharmacy:  Oxycodone, Keflex  Patient was  provided with the General Surgical Risk consent document and Pain Medication Agreement prior to their appointment.  They had  adequate time to read through the risk consent documents and Pain Medication Agreement. We also discussed them in person together during this preop appointment. All of their questions were answered to their satisfaction.  Recommended calling if they have any further questions.  Risk consent form and Pain Medication Agreement to be scanned into patient's chart.  Patient was provided with the [employer] implant patient decision checklist and this was completed during today's preoperative evaluation. Patient had time to read through the information and any questions were answered to their content. Form will be scanned into patient's chart.  The risks that can be encountered with and after placement of a breast implant were discussed and include the following but not limited to these: bleeding, infection, delayed healing, anesthesia risks, skin sensation changes, injury to structures including nerves, blood vessels, and muscles which may be temporary or permanent, allergies to tape, suture materials and glues, blood products, topical preparations or injected agents, skin contour irregularities, skin discoloration and swelling, deep vein thrombosis, cardiac and pulmonary complications, pain, which may persist, fluid accumulation, wrinkling of the skin over the implanmt, changes in nipple or breast sensation, implant leakage or rupture, faulty position of the implant, persistent pain, formation of tight scar tissue around the implant (capsular contracture).  Recommend holding multivitamin and fish oil prior to surgery, 2 weeks for fish oil, 1 week for multivitamin.  Recommend holding ibuprofen 5 days prior to surgery.   Electronically signed by: Carola Rhine Lamark Schue, PA-C 11/05/2022 10:53 AM

## 2022-11-05 NOTE — Progress Notes (Signed)
Patient ID: Marie Nguyen, female    DOB: [birth year], 67 y.o.   MRN: [id number]  Chief Complaint  Patient presents with   Pre-op Exam      ICD-10-CM   1. Bilateral malignant neoplasm of breast in female, unspecified estrogen receptor status, unspecified site of breast ([place])  C50.911    C50.912     2. S/P breast reconstruction  Z98.890     3. Malignant neoplasm of right female breast, unspecified estrogen receptor status, unspecified site of breast ([place])  C50.911     4. Cancer of overlapping sites of right breast [facility])  C50.811       History of Present Illness: Marie Nguyen is a 67 y.o.  female  with a history of breast cancer.  She presents for preoperative evaluation for upcoming procedure, removal of bilateral tissue expanders and placement of implants, scheduled for 11/14/2022 with Dr. Marla Roe.  The patient has not had problems with anesthesia. No history of DVT/PE.  No family history of DVT/PE.  No family or personal history of bleeding or clotting disorders.  Patient is not currently taking any blood thinners.  No history of CVA/MI.   Summary of Previous Visit: Patient had bilateral mastectomies in November with immediate reconstruction, has 325 cc/455 cc expanders.  She has not had any chemotherapy or radiation.  Job: Works for [employer], computer-based.  Has the option to work from home.  We will plan about 10 days out of work, may be able to return sooner.  PMH Significant for: GERD, hypertension, history of bilateral mastectomy with immediate breast reconstruction.   Past Medical History: Allergies: Allergies  Allergen Reactions   Latex Rash    Current Medications:  Current Outpatient Medications:    acetaminophen (TYLENOL) 500 MG tablet, Take 1,000 mg by mouth every 6 (six) hours as needed for mild pain., Disp: , Rfl:    azelastine (ASTELIN) 0.1 % nasal spray, Place 1 spray into both nostrils daily. Use in each nostril as directed, Disp: ,  Rfl:    cephALEXin (KEFLEX) 500 MG capsule, Take 1 capsule (500 mg total) by mouth 4 (four) times daily for 5 days., Disp: 20 capsule, Rfl: 0   diazepam (VALIUM) 2 MG tablet, Take 1 tablet (2 mg total) by mouth every 12 (twelve) hours as needed for muscle spasms., Disp: 20 tablet, Rfl: 0   fluticasone (FLONASE) 50 MCG/ACT nasal spray, Place 1 spray into both nostrils daily as needed for allergies or rhinitis., Disp: , Rfl:    ibuprofen (ADVIL) 200 MG tablet, Take 200 mg by mouth every 6 (six) hours as needed for mild pain. 2 - 3 tabs, Disp: , Rfl:    letrozole (FEMARA) 2.5 MG tablet, Take 1 tablet (2.5 mg total) by mouth daily., Disp: 90 tablet, Rfl: 3   lisinopril-hydrochlorothiazide (ZESTORETIC) 10-12.5 MG tablet, Take 1 tablet by mouth daily., Disp: 90 tablet, Rfl: 1   Multiple Vitamin (MULTIVITAMIN) tablet, Take 1 tablet by mouth daily., Disp: , Rfl:    Omega-3 Fatty Acids (FISH OIL) 1000 MG CAPS, Take 1 capsule by mouth as needed., Disp: , Rfl:    omeprazole (PRILOSEC) 20 MG capsule, Take 20 mg by mouth daily., Disp: , Rfl:    oxyCODONE (OXY IR/ROXICODONE) 5 MG immediate release tablet, Take 1 tablet (5 mg total) by mouth every 6 (six) hours as needed for up to 5 days for severe pain., Disp: 20 tablet, Rfl: 0   traMADol (ULTRAM) 50 MG tablet, Take  1 tablet (50 mg total) by mouth every 6 (six) hours as needed., Disp: 30 tablet, Rfl: 0   triamcinolone cream (KENALOG) 0.1 %, 1 Application as needed., Disp: , Rfl:    lidocaine-prilocaine (EMLA) cream, Apply to the areola of both breasts and cover with plastic wrap one hour prior to leaving for surgery., Disp: 5 g, Rfl: 0   ondansetron (ZOFRAN) 4 MG tablet, Take 1 tablet (4 mg total) by mouth every 8 (eight) hours as needed for nausea or vomiting., Disp: 20 tablet, Rfl: 0  Past Medical Problems: Past Medical History:  Diagnosis Date   Allergy    Arthritis    Eczema    GERD (gastroesophageal reflux disease)    Heart murmur    Hypertension      Past Surgical History: Past Surgical History:  Procedure Laterality Date   ABDOMINAL HYSTERECTOMY     AUGMENTATION MAMMAPLASTY     AXILLARY SENTINEL NODE BIOPSY Bilateral 07/26/2022   Procedure: AXILLARY SENTINEL NODE BIOPSY;  Surgeon: Olean Ree, MD;  Location: [facility] ORS;  Service: General;  Laterality: Bilateral;   BREAST BIOPSY Right    neg   BREAST BIOPSY Right 05/16/2022   x 2 areas/ [place] bx & Stereo bx coil clip/INC and DCIS   BREAST BIOPSY Left 05/16/2022   [place] bx/ [facility] with DCIS   BREAST RECONSTRUCTION WITH PLACEMENT OF TISSUE EXPANDER AND ALLODERM Bilateral 07/26/2022   Procedure: BREAST RECONSTRUCTION WITH PLACEMENT OF TISSUE EXPANDER AND ALLODERM;  Surgeon: Wallace Going, DO;  Location: [facility] ORS;  Service: Plastics;  Laterality: Bilateral;   CESAREAN SECTION     COLONOSCOPY WITH PROPOFOL N/A 11/24/2018   Procedure: COLONOSCOPY WITH PROPOFOL;  Surgeon: Jonathon Bellows, MD;  Location: [facility] ENDOSCOPY;  Service: Gastroenterology;  Laterality: N/A;   COLONOSCOPY WITH PROPOFOL N/A 07/03/2021   Procedure: COLONOSCOPY WITH PROPOFOL;  Surgeon: Jonathon Bellows, MD;  Location: [facility] ENDOSCOPY;  Service: Gastroenterology;  Laterality: N/A;   GANGLION CYST EXCISION Right    wrist   MASTECTOMY Bilateral    SIMPLE MASTECTOMY WITH AXILLARY SENTINEL NODE BIOPSY Bilateral 07/26/2022   Procedure: SIMPLE MASTECTOMY, Otho Ket, PA-C to assist;  Surgeon: Olean Ree, MD;  Location: [facility] ORS;  Service: General;  Laterality: Bilateral;   TOTAL ABDOMINAL HYSTERECTOMY      Social History: Social History   Socioeconomic History   Marital status: Divorced    Spouse name: Not on file   Number of children: 2   Years of education: Not on file   Highest education level: Not on file  Occupational History   Not on file  Tobacco Use   Smoking status: Never    Passive exposure: Never   Smokeless tobacco: Never  Vaping Use   Vaping Use: Never used  Substance and Sexual Activity   Alcohol  use: Yes    Comment: rarely   Drug use: Never   Sexual activity: Yes    Partners: Male  Other Topics Concern   Not on file  Social History Narrative   Lives alone   Social Determinants of Health   Financial Resource Strain: Low Risk  (06/15/2022)   Overall Financial Resource Strain (CARDIA)    Difficulty of Paying Living Expenses: Not very hard  Food Insecurity: No Food Insecurity (07/26/2022)   Hunger Vital Sign    Worried About Running Out of Food in the Last Year: Never true    Ran Out of Food in the Last Year: Never true  Transportation Needs: No Transportation Needs (07/26/2022)  PRAPARE - Hydrologist (Medical): No    Lack of Transportation (Non-Medical): No  Physical Activity: Sufficiently Active (06/15/2022)   Exercise Vital Sign    Days of Exercise per Week: 5 days    Minutes of Exercise per Session: 30 min  Stress: No Stress Concern Present (06/15/2022)   [place]    Feeling of Stress : Only a little  Social Connections: Moderately Integrated (06/15/2022)   Social Connection and Isolation Panel [NHANES]    Frequency of Communication with Friends and Family: More than three times a week    Frequency of Social Gatherings with Friends and Family: More than three times a week    Attends Religious Services: 1 to 4 times per year    Active Member of [employer] or Organizations: Yes    Attends Archivist Meetings: 1 to 4 times per year    Marital Status: Divorced  Human resources officer Violence: Not At Risk (07/26/2022)   Humiliation, Afraid, Rape, and Kick questionnaire    Fear of Current or Ex-Partner: No    Emotionally Abused: No    Physically Abused: No    Sexually Abused: No    Family History: Family History  Problem Relation Age of Onset   Breast cancer Mother 62   Hypertension Mother    Diabetes Father    Hypertension Father    Stroke Father    Hypertension Sister     Colon cancer Maternal Grandfather 51    Review of Systems: ROS  Physical Exam: Vital Signs BP (!) 151/96 (BP Location: Left Arm, Patient Position: Sitting, Cuff Size: Normal)   Pulse 72   Ht 5' 7"$  (1.702 m)   Wt 133 lb 3.2 oz (60.4 kg)   SpO2 98%   BMI 20.86 kg/m   Physical Exam Constitutional:      General: Not in acute distress.    Appearance: Normal appearance. Not ill-appearing.  HENT:     Head: Normocephalic and atraumatic.  Eyes:     Pupils: Pupils are equal, round Neck:     Musculoskeletal: Normal range of motion.  Cardiovascular:     Rate and Rhythm: Normal rate    Pulses: Normal pulses.  Pulmonary:     Effort: Pulmonary effort is normal. No respiratory distress.  Abdominal:     General: Abdomen is flat. There is no distension.  Musculoskeletal: Normal range of motion.  Skin:    General: Skin is warm and dry.     Findings: No erythema or rash.  Neurological:     General: No focal deficit present.     Mental Status: Alert and oriented to person, place, and time. Mental status is at baseline.     Motor: No weakness.  Psychiatric:        Mood and Affect: Mood normal.        Behavior: Behavior normal.    Assessment/Plan: The patient is scheduled for exchange of bilateral breast tissue expanders for silicone breast implants with Dr. Marla Roe.  Risks, benefits, and alternatives of procedure discussed, questions answered and consent obtained.    Smoking Status: Non-smoker; Counseling Given?  N/A Last Mammogram: Status post mastectomy  Caprini Score: 6; Risk Factors include: Age, breast cancer, and length of planned surgery. Recommendation for mechanical prophylaxis. Encourage early ambulation.   Pictures obtained: Pictures taken a few weeks ago at a previous visit  Post-op Rx sent to pharmacy:  Oxycodone, Keflex  Patient was  provided with the General Surgical Risk consent document and Pain Medication Agreement prior to their appointment.  They had  adequate time to read through the risk consent documents and Pain Medication Agreement. We also discussed them in person together during this preop appointment. All of their questions were answered to their satisfaction.  Recommended calling if they have any further questions.  Risk consent form and Pain Medication Agreement to be scanned into patient's chart.  Patient was provided with the Mentor implant patient decision checklist and this was completed during today's preoperative evaluation. Patient had time to read through the information and any questions were answered to their content. Form will be scanned into patient's chart.  The risks that can be encountered with and after placement of a breast implant were discussed and include the following but not limited to these: bleeding, infection, delayed healing, anesthesia risks, skin sensation changes, injury to structures including nerves, blood vessels, and muscles which may be temporary or permanent, allergies to tape, suture materials and glues, blood products, topical preparations or injected agents, skin contour irregularities, skin discoloration and swelling, deep vein thrombosis, cardiac and pulmonary complications, pain, which may persist, fluid accumulation, wrinkling of the skin over the implanmt, changes in nipple or breast sensation, implant leakage or rupture, faulty position of the implant, persistent pain, formation of tight scar tissue around the implant (capsular contracture).  Recommend holding multivitamin and fish oil prior to surgery, 2 weeks for fish oil, 1 week for multivitamin.  Recommend holding ibuprofen 5 days prior to surgery.   Electronically signed by: Carola Rhine Jahson Emanuele, PA-C 11/05/2022 10:53 AM

## 2022-11-07 ENCOUNTER — Encounter (HOSPITAL_BASED_OUTPATIENT_CLINIC_OR_DEPARTMENT_OTHER): Payer: Self-pay | Admitting: Plastic Surgery

## 2022-11-07 ENCOUNTER — Other Ambulatory Visit: Payer: Self-pay

## 2022-11-12 ENCOUNTER — Encounter (HOSPITAL_BASED_OUTPATIENT_CLINIC_OR_DEPARTMENT_OTHER)
Admission: RE | Admit: 2022-11-12 | Discharge: 2022-11-12 | Disposition: A | Discharge status: Home / Self Care | Payer: BC Managed Care – PPO | Source: Ambulatory Visit | Attending: Plastic Surgery | Admitting: Plastic Surgery

## 2022-11-12 DIAGNOSIS — I10 Essential (primary) hypertension: Secondary | ICD-10-CM | POA: Diagnosis not present

## 2022-11-12 DIAGNOSIS — Z01812 Encounter for preprocedural laboratory examination: Secondary | ICD-10-CM | POA: Insufficient documentation

## 2022-11-12 DIAGNOSIS — K219 Gastro-esophageal reflux disease without esophagitis: Secondary | ICD-10-CM | POA: Diagnosis not present

## 2022-11-12 DIAGNOSIS — Z79899 Other long term (current) drug therapy: Secondary | ICD-10-CM | POA: Insufficient documentation

## 2022-11-12 DIAGNOSIS — Z853 Personal history of malignant neoplasm of breast: Secondary | ICD-10-CM | POA: Diagnosis not present

## 2022-11-12 DIAGNOSIS — Z9013 Acquired absence of bilateral breasts and nipples: Secondary | ICD-10-CM | POA: Diagnosis not present

## 2022-11-12 DIAGNOSIS — Z421 Encounter for breast reconstruction following mastectomy: Secondary | ICD-10-CM | POA: Diagnosis not present

## 2022-11-12 LAB — BASIC METABOLIC PANEL
Anion gap: 11 (ref 5–15)
BUN: 11 mg/dL (ref 8–23)
CO2: 25 mmol/L (ref 22–32)
Calcium: 9.4 mg/dL (ref 8.9–10.3)
Chloride: 103 mmol/L (ref 98–111)
Creatinine, Ser: 0.72 mg/dL (ref 0.44–1.00)
GFR, Estimated: >60 mL/min (ref >60)
Glucose, Bld: 92 mg/dL (ref 70–99)
Potassium: 4.4 mmol/L (ref 3.5–5.1)
Sodium: 139 mmol/L (ref 135–145)

## 2022-11-14 ENCOUNTER — Ambulatory Visit (HOSPITAL_BASED_OUTPATIENT_CLINIC_OR_DEPARTMENT_OTHER): Payer: BC Managed Care – PPO | Admitting: Anesthesiology

## 2022-11-14 ENCOUNTER — Encounter (HOSPITAL_BASED_OUTPATIENT_CLINIC_OR_DEPARTMENT_OTHER): Payer: Self-pay | Admitting: Plastic Surgery

## 2022-11-14 ENCOUNTER — Encounter (HOSPITAL_BASED_OUTPATIENT_CLINIC_OR_DEPARTMENT_OTHER)
Admission: RE | Disposition: A | Discharge status: Home / Self Care | Payer: Self-pay | Source: Ambulatory Visit | Attending: Plastic Surgery

## 2022-11-14 ENCOUNTER — Other Ambulatory Visit: Payer: Self-pay

## 2022-11-14 ENCOUNTER — Ambulatory Visit (HOSPITAL_BASED_OUTPATIENT_CLINIC_OR_DEPARTMENT_OTHER)
Admission: RE | Admit: 2022-11-14 | Discharge: 2022-11-14 | Disposition: A | Discharge status: Home / Self Care | Payer: BC Managed Care – PPO | Source: Ambulatory Visit | Attending: Plastic Surgery | Admitting: Plastic Surgery

## 2022-11-14 DIAGNOSIS — I10 Essential (primary) hypertension: Secondary | ICD-10-CM | POA: Insufficient documentation

## 2022-11-14 DIAGNOSIS — C50912 Malignant neoplasm of unspecified site of left female breast: Secondary | ICD-10-CM | POA: Diagnosis not present

## 2022-11-14 DIAGNOSIS — K219 Gastro-esophageal reflux disease without esophagitis: Secondary | ICD-10-CM | POA: Insufficient documentation

## 2022-11-14 DIAGNOSIS — Z7982 Long term (current) use of aspirin: Secondary | ICD-10-CM

## 2022-11-14 DIAGNOSIS — Z853 Personal history of malignant neoplasm of breast: Secondary | ICD-10-CM | POA: Insufficient documentation

## 2022-11-14 DIAGNOSIS — Z421 Encounter for breast reconstruction following mastectomy: Secondary | ICD-10-CM

## 2022-11-14 DIAGNOSIS — Z9013 Acquired absence of bilateral breasts and nipples: Secondary | ICD-10-CM | POA: Insufficient documentation

## 2022-11-14 DIAGNOSIS — Z79899 Other long term (current) drug therapy: Secondary | ICD-10-CM

## 2022-11-14 DIAGNOSIS — C50911 Malignant neoplasm of unspecified site of right female breast: Secondary | ICD-10-CM | POA: Diagnosis not present

## 2022-11-14 HISTORY — PX: REMOVAL OF TISSUE EXPANDER AND PLACEMENT OF IMPLANT: SHX6457

## 2022-11-14 SURGERY — REMOVAL, TISSUE EXPANDER, BREAST, WITH IMPLANT INSERTION
Anesthesia: General | Site: Breast | Laterality: Bilateral

## 2022-11-14 MED ORDER — PROPOFOL 10 MG/ML IV BOLUS
INTRAVENOUS | Status: AC
  Filled 2022-11-14: qty 20

## 2022-11-14 MED ORDER — SODIUM CHLORIDE 0.9% FLUSH: 3.0000 mL | Freq: Two times a day (BID) | INTRAVENOUS | Status: DC

## 2022-11-14 MED ORDER — LIDOCAINE 2% (20 MG/ML) 5 ML SYRINGE
INTRAMUSCULAR | Status: DC | PRN
  Administered 2022-11-14: 60 mg via INTRAVENOUS

## 2022-11-14 MED ORDER — FENTANYL CITRATE (PF) 100 MCG/2ML IJ SOLN
INTRAMUSCULAR | Status: AC
  Filled 2022-11-14: qty 2

## 2022-11-14 MED ORDER — LIDOCAINE-EPINEPHRINE 1 %-1:100000 IJ SOLN
INTRAMUSCULAR | Status: AC
  Filled 2022-11-14: qty 1

## 2022-11-14 MED ORDER — ACETAMINOPHEN 10 MG/ML IV SOLN: 1000.0000 mg | Freq: Once | INTRAVENOUS | Status: DC | PRN

## 2022-11-14 MED ORDER — ARTIFICIAL TEARS OPHTHALMIC OINT
TOPICAL_OINTMENT | OPHTHALMIC | Status: AC
  Filled 2022-11-14: qty 3.5

## 2022-11-14 MED ORDER — ONDANSETRON HCL 4 MG/2ML IJ SOLN
4.0000 mg | Freq: Once | INTRAMUSCULAR | Status: AC | PRN
  Administered 2022-11-14: 4 mg via INTRAVENOUS

## 2022-11-14 MED ORDER — FENTANYL CITRATE (PF) 100 MCG/2ML IJ SOLN: 25.0000 ug | INTRAMUSCULAR | Status: DC | PRN

## 2022-11-14 MED ORDER — BUPIVACAINE HCL (PF) 0.25 % IJ SOLN
INTRAMUSCULAR | Status: AC
  Filled 2022-11-14: qty 30

## 2022-11-14 MED ORDER — LACTATED RINGERS IV SOLN: INTRAVENOUS | Status: DC

## 2022-11-14 MED ORDER — OXYCODONE HCL 5 MG PO TABS
5.0000 mg | ORAL_TABLET | ORAL | Status: DC | PRN
  Administered 2022-11-14: 10 mg via ORAL

## 2022-11-14 MED ORDER — ACETAMINOPHEN 325 MG RE SUPP: 650.0000 mg | RECTAL | Status: DC | PRN

## 2022-11-14 MED ORDER — FENTANYL CITRATE (PF) 250 MCG/5ML IJ SOLN
INTRAMUSCULAR | Status: DC | PRN
  Administered 2022-11-14 (×2): 25 ug via INTRAVENOUS
  Administered 2022-11-14: 50 ug via INTRAVENOUS

## 2022-11-14 MED ORDER — PROPOFOL 10 MG/ML IV BOLUS
INTRAVENOUS | Status: DC | PRN
  Administered 2022-11-14: 200 mg via INTRAVENOUS

## 2022-11-14 MED ORDER — CHLORHEXIDINE GLUCONATE CLOTH 2 % EX PADS: 6.0000 | MEDICATED_PAD | Freq: Once | CUTANEOUS | Status: DC

## 2022-11-14 MED ORDER — ONDANSETRON HCL 4 MG/2ML IJ SOLN
INTRAMUSCULAR | Status: AC
  Filled 2022-11-14: qty 2

## 2022-11-14 MED ORDER — MIDAZOLAM HCL 2 MG/2ML IJ SOLN
INTRAMUSCULAR | Status: AC
  Filled 2022-11-14: qty 2

## 2022-11-14 MED ORDER — ACETAMINOPHEN 500 MG PO TABS: 1000.0000 mg | ORAL_TABLET | Freq: Once | ORAL | Status: DC

## 2022-11-14 MED ORDER — ACETAMINOPHEN 325 MG PO TABS: 650.0000 mg | ORAL_TABLET | ORAL | Status: DC | PRN

## 2022-11-14 MED ORDER — OXYMETAZOLINE HCL 0.05 % NA SOLN
NASAL | Status: AC
  Filled 2022-11-14: qty 30

## 2022-11-14 MED ORDER — SODIUM CHLORIDE 0.9 % IV SOLN
INTRAVENOUS | Status: AC
  Filled 2022-11-14: qty 10

## 2022-11-14 MED ORDER — AMISULPRIDE (ANTIEMETIC) 5 MG/2ML IV SOLN: 10.0000 mg | Freq: Once | INTRAVENOUS | Status: DC | PRN

## 2022-11-14 MED ORDER — EPHEDRINE SULFATE-NACL 50-0.9 MG/10ML-% IV SOSY
PREFILLED_SYRINGE | INTRAVENOUS | Status: DC | PRN
  Administered 2022-11-14: 10 mg via INTRAVENOUS

## 2022-11-14 MED ORDER — LIDOCAINE-EPINEPHRINE 1 %-1:100000 IJ SOLN
INTRAMUSCULAR | Status: DC | PRN
  Administered 2022-11-14: 5 mL via INTRAMUSCULAR

## 2022-11-14 MED ORDER — OXYCODONE HCL 5 MG PO TABS
ORAL_TABLET | ORAL | Status: AC
  Filled 2022-11-14: qty 2

## 2022-11-14 MED ORDER — KETOROLAC TROMETHAMINE 15 MG/ML IJ SOLN: 15.0000 mg | Freq: Once | INTRAMUSCULAR | Status: DC | PRN

## 2022-11-14 MED ORDER — DEXAMETHASONE SODIUM PHOSPHATE 10 MG/ML IJ SOLN
INTRAMUSCULAR | Status: AC
  Filled 2022-11-14: qty 1

## 2022-11-14 MED ORDER — SODIUM CHLORIDE 0.9 % IV SOLN: 250.0000 mL | INTRAVENOUS | Status: DC | PRN

## 2022-11-14 MED ORDER — CEFAZOLIN SODIUM-DEXTROSE 2-4 GM/100ML-% IV SOLN
2.0000 g | INTRAVENOUS | Status: AC
  Administered 2022-11-14: 2 g via INTRAVENOUS

## 2022-11-14 MED ORDER — LIDOCAINE 2% (20 MG/ML) 5 ML SYRINGE
INTRAMUSCULAR | Status: AC
  Filled 2022-11-14: qty 5

## 2022-11-14 MED ORDER — DEXAMETHASONE SODIUM PHOSPHATE 10 MG/ML IJ SOLN
INTRAMUSCULAR | Status: DC | PRN
  Administered 2022-11-14: 5 mg via INTRAVENOUS

## 2022-11-14 MED ORDER — MIDAZOLAM HCL 5 MG/5ML IJ SOLN
INTRAMUSCULAR | Status: DC | PRN
  Administered 2022-11-14: 2 mg via INTRAVENOUS

## 2022-11-14 MED ORDER — SODIUM CHLORIDE 0.9% FLUSH: 3.0000 mL | INTRAVENOUS | Status: DC | PRN

## 2022-11-14 SURGICAL SUPPLY — 65 items
ADH SKN CLS APL DERMABOND .7 (GAUZE/BANDAGES/DRESSINGS) ×2
BAG DECANTER FOR FLEXI CONT (MISCELLANEOUS) ×1 IMPLANT
BINDER BREAST LRG (GAUZE/BANDAGES/DRESSINGS) IMPLANT
BINDER BREAST MEDIUM (GAUZE/BANDAGES/DRESSINGS) IMPLANT
BINDER BREAST XLRG (GAUZE/BANDAGES/DRESSINGS) IMPLANT
BINDER BREAST XXLRG (GAUZE/BANDAGES/DRESSINGS) IMPLANT
BIOPATCH RED 1 DISK 7.0 (GAUZE/BANDAGES/DRESSINGS) IMPLANT
BLADE SURG 15 STRL LF DISP TIS (BLADE) ×1 IMPLANT
BLADE SURG 15 STRL SS (BLADE) ×1
CANISTER SUCT 1200ML W/VALVE (MISCELLANEOUS) ×1 IMPLANT
COVER BACK TABLE 60X90IN (DRAPES) ×1 IMPLANT
COVER MAYO STAND STRL (DRAPES) ×1 IMPLANT
COVER SURGICAL LIGHT HANDLE (MISCELLANEOUS) IMPLANT
DERMABOND ADVANCED .7 DNX12 (GAUZE/BANDAGES/DRESSINGS) IMPLANT
DRAIN CHANNEL 19F RND (DRAIN) IMPLANT
DRAPE LAPAROSCOPIC ABDOMINAL (DRAPES) ×1 IMPLANT
DRSG MEPILEX POST OP 4X8 (GAUZE/BANDAGES/DRESSINGS) ×2 IMPLANT
ELECT BLADE 4.0 EZ CLEAN MEGAD (MISCELLANEOUS) ×1
ELECT COATED BLADE 2.86 ST (ELECTRODE) ×1 IMPLANT
ELECT REM PT RETURN 9FT ADLT (ELECTROSURGICAL) ×1
ELECTRODE BLDE 4.0 EZ CLN MEGD (MISCELLANEOUS) ×1 IMPLANT
ELECTRODE REM PT RTRN 9FT ADLT (ELECTROSURGICAL) ×1 IMPLANT
EVACUATOR SILICONE 100CC (DRAIN) IMPLANT
FUNNEL KELLER 2 DISP (MISCELLANEOUS) IMPLANT
GAUZE PAD ABD 8X10 STRL (GAUZE/BANDAGES/DRESSINGS) ×2 IMPLANT
GAUZE SPONGE 4X4 12PLY STRL LF (GAUZE/BANDAGES/DRESSINGS) IMPLANT
GLOVE BIO SURGEON STRL SZ 6.5 (GLOVE) ×2 IMPLANT
GLOVE BIO SURGEON STRL SZ7.5 (GLOVE) ×1 IMPLANT
GOWN STRL REUS W/ TWL LRG LVL3 (GOWN DISPOSABLE) ×3 IMPLANT
GOWN STRL REUS W/TWL LRG LVL3 (GOWN DISPOSABLE) ×3
IMPL BREAST GEL HP 400CC (Breast) IMPLANT
IMPLANT BREAST GEL HP 400CC (Breast) ×2 IMPLANT
IV NS 1000ML (IV SOLUTION)
IV NS 1000ML BAXH (IV SOLUTION) IMPLANT
NDL HYPO 25X1 1.5 SAFETY (NEEDLE) ×1 IMPLANT
NDL SAFETY ECLIP 18X1.5 (MISCELLANEOUS) ×1 IMPLANT
NEEDLE HYPO 25X1 1.5 SAFETY (NEEDLE) ×1 IMPLANT
PACK BASIN DAY SURGERY FS (CUSTOM PROCEDURE TRAY) ×1 IMPLANT
PENCIL SMOKE EVACUATOR (MISCELLANEOUS) ×1 IMPLANT
PIN SAFETY STERILE (MISCELLANEOUS) IMPLANT
SIZER BREAST REUSE 400CC (SIZER) ×1
SIZER BRST REUSE 400CC (SIZER) IMPLANT
SLEEVE SCD COMPRESS KNEE MED (STOCKING) ×1 IMPLANT
SPIKE FLUID TRANSFER (MISCELLANEOUS) IMPLANT
SPONGE T-LAP 18X18 ~~LOC~~+RFID (SPONGE) ×2 IMPLANT
STRIP SUTURE WOUND CLOSURE 1/2 (MISCELLANEOUS) IMPLANT
SUT MNCRL AB 3-0 PS2 18 (SUTURE) IMPLANT
SUT MNCRL AB 4-0 PS2 18 (SUTURE) IMPLANT
SUT MON AB 3-0 SH 27 (SUTURE)
SUT MON AB 3-0 SH27 (SUTURE) IMPLANT
SUT MON AB 5-0 PS2 18 (SUTURE) IMPLANT
SUT PDS 3-0 CT2 (SUTURE) ×5
SUT PDS AB 2-0 CT2 27 (SUTURE) IMPLANT
SUT PDS II 3-0 CT2 27 ABS (SUTURE) IMPLANT
SUT SILK 3 0 PS 1 (SUTURE) IMPLANT
SUT VIC AB 3-0 SH 27 (SUTURE)
SUT VIC AB 3-0 SH 27X BRD (SUTURE) IMPLANT
SUT VICRYL 4-0 PS2 18IN ABS (SUTURE) IMPLANT
SYR BULB IRRIG 60ML STRL (SYRINGE) ×1 IMPLANT
SYR CONTROL 10ML LL (SYRINGE) ×1 IMPLANT
TOWEL GREEN STERILE FF (TOWEL DISPOSABLE) ×2 IMPLANT
TRAY DSU PREP LF (CUSTOM PROCEDURE TRAY) ×1 IMPLANT
TUBE CONNECTING 20X1/4 (TUBING) ×1 IMPLANT
UNDERPAD 30X36 HEAVY ABSORB (UNDERPADS AND DIAPERS) ×2 IMPLANT
YANKAUER SUCT BULB TIP NO VENT (SUCTIONS) ×1 IMPLANT

## 2022-11-14 NOTE — Op Note (Signed)
Op report Bilateral Exchange   DATE OF OPERATION: 11/14/2022  LOCATION: [place]  SURGICAL DIVISION: Plastic Surgery  PREOPERATIVE DIAGNOSIS:  1.History of breast cancer.  2. Acquired absence of bilateral breast.   POSTOPERATIVE DIAGNOSIS:  1. History of breast cancer.  2. Acquired absence of bilateral breast.   PROCEDURE:  1. Bilateral exchange of tissue expanders for implants.  2. Bilateral capsulotomies for implant respositioning.  SURGEON: Leira Regino Sanger Belton Peplinski, DO  ASSISTANT: Roetta Sessions, PA  ANESTHESIA:  General.   COMPLICATIONS: None.   IMPLANTS: Left - Mentor Smooth Round Ultra High Profile Gel 400 cc. Right - Mentor Smooth Round Ultra High Profile Gel 400 cc.   INDICATIONS FOR PROCEDURE:  The patient, Marie Nguyen, is a 67 y.o. female born on 28-Jul-1956, is here for treatment after bilateral mastectomies.  She had tissue expanders placed at the time of mastectomies. She now presents for exchange of her expanders for implants.  She requires capsulotomies to better position the implants. MRN: [id number]  CONSENT:  Informed consent was obtained directly from the patient. Risks, benefits and alternatives were fully discussed. Specific risks including but not limited to bleeding, infection, hematoma, seroma, scarring, pain, implant infection, implant extrusion, capsular contracture, asymmetry, wound healing problems, and need for further surgery were all discussed. The patient did have an ample opportunity to have her questions answered to her satisfaction.   DESCRIPTION OF PROCEDURE:  The patient was taken to the operating room. SCDs were placed and IV antibiotics were given. The patient's chest was prepped and draped in a sterile fashion. A time out was performed and the implants to be used were identified.    Right breast: Local with epinephrine was used to infiltrate at the incision site. The old mastectomy scar was incised.  The  mastectomy flaps from the superior and inferior flaps were raised over the pectoralis major muscle for several centimeters to minimize tension for the closure. The pectoralis was split inferior to the skin incision to expose and remove the tissue expander.  Inspection of the pocket showed a normal healthy capsule and good integration of the biologic matrix.  The pocket was irrigated with antibiotic solution.  Circumferential capsulotomies were performed to allow for breast pocket expansion.  Measurements were made and a sizer used to confirm adequate pocket size for the implant dimensions.  Hemostasis was ensured with electrocautery. New gloves were placed. The implant was soaked in antibiotic solution and then placed in the pocket and oriented appropriately. The pectoralis major muscle and capsule on the anterior surface were re-closed with a 3-0 PDS suture. The remaining skin was closed with 4-0 Monocryl deep dermal and 5-0 Monocryl subcuticular stitches.   Left breast: The old mastectomy scar was incised.  The mastectomy flaps from the superior and inferior flaps were raised over the pectoralis major muscle for several centimeters to minimize tension for the closure. The pectoralis was split inferior to the skin incision to expose and remove the tissue expander.  Inspection of the pocket showed a normal healthy capsule and good integration of the biologic matrix. The expander was resting laterally.  A capsulotomy was done laterally and the capsule was repositioned more medially.  The capsule was closed with the 3-0 PDS.  Circumferential capsulotomies were performed to allow for breast pocket expansion.  Measurements were made and a sizer utilized to confirm adequate pocket size for the implant dimensions.  Hemostasis was ensured with the electrocautery.  New gloves were applied. The implant was soaked  in antibiotic solution and placed in the pocket and oriented appropriately. The pectoralis major muscle and  capsule on the anterior surface were re-closed with a 3-0 PDS suture. The remaining skin was closed with 4-0 Monocryl deep dermal and 5-0 Monocryl subcuticular stitches.  Dermabond was applied to the incision site. A breast binder and ABDs were placed.  The patient was allowed to wake from anesthesia and taken to the recovery room in satisfactory condition.   The advanced practice practitioner (APP) assisted throughout the case.  The APP was essential in retraction and counter traction when needed to make the case progress smoothly.  This retraction and assistance made it possible to see the tissue plans for the procedure.  The assistance was needed for blood control, tissue re-approximation and assisted with closure of the incision site.

## 2022-11-14 NOTE — Transfer of Care (Signed)
Immediate Anesthesia Transfer of Care Note  Patient: Marie Nguyen  Procedure(s) Performed: REMOVAL OF TISSUE EXPANDER AND PLACEMENT OF IMPLANT (Bilateral: Breast)  Patient Location: PACU  Anesthesia Type:General  Level of Consciousness: awake, alert , and oriented  Airway & Oxygen Therapy: Patient Spontanous Breathing  Post-op Assessment: Report given to RN and Post -op Vital signs reviewed and stable  Post vital signs: Reviewed and stable  Last Vitals:  Vitals Value Taken Time  BP 136/79 11/14/22 1558  Temp    Pulse 79 11/14/22 1600  Resp 11 11/14/22 1600  SpO2 99 % 11/14/22 1600  Vitals shown include unvalidated device data.  Last Pain:  Vitals:   11/14/22 1241  TempSrc: Oral  PainSc: 0-No pain      Patients Stated Pain Goal: 8 ([id number])  Complications: No notable events documented.

## 2022-11-14 NOTE — Interval H&P Note (Signed)
History and Physical Interval Note:  11/14/2022 1:59 PM  Marie Nguyen  has presented today for surgery, with the diagnosis of Bilateral malignant neoplasm of breast in female.  The various methods of treatment have been discussed with the patient and family. After consideration of risks, benefits and other options for treatment, the patient has consented to  Procedure(s): REMOVAL OF TISSUE EXPANDER AND PLACEMENT OF IMPLANT (Bilateral) as a surgical intervention.  The patient's history has been reviewed, patient examined, no change in status, stable for surgery.  I have reviewed the patient's chart and labs.  Questions were answered to the patient's satisfaction.     Loel Lofty Tehya Leath

## 2022-11-14 NOTE — Anesthesia Preprocedure Evaluation (Addendum)
Anesthesia Evaluation  Patient identified by MRN, date of birth, ID band Patient awake    Reviewed: Allergy & Precautions, NPO status , Patient's Chart, lab work & pertinent test results  Airway Mallampati: II  TM Distance: >3 FB Neck ROM: Full    Dental no notable dental hx.    Pulmonary neg pulmonary ROS   Pulmonary exam normal        Cardiovascular hypertension, Pt. on medications Normal cardiovascular exam     Neuro/Psych negative neurological ROS  negative psych ROS   GI/Hepatic Neg liver ROS,GERD  Medicated and Controlled,,  Endo/Other  negative endocrine ROS    Renal/GU negative Renal ROS     Musculoskeletal  (+) Arthritis ,    Abdominal   Peds  Hematology negative hematology ROS (+)   Anesthesia Other Findings Bilateral malignant neoplasm of breast in female  Reproductive/Obstetrics                             Anesthesia Physical Anesthesia Plan  ASA: 2  Anesthesia Plan: General   Post-op Pain Management:    Induction: Intravenous  PONV Risk Score and Plan: 3 and Ondansetron, Dexamethasone, Midazolam and Treatment may vary due to age or medical condition  Airway Management Planned: LMA  Additional Equipment:   Intra-op Plan:   Post-operative Plan: Extubation in OR  Informed Consent: I have reviewed the patients History and Physical, chart, labs and discussed the procedure including the risks, benefits and alternatives for the proposed anesthesia with the patient or authorized representative who has indicated his/her understanding and acceptance.     Dental advisory given  Plan Discussed with: CRNA  Anesthesia Plan Comments:        Anesthesia Quick Evaluation

## 2022-11-14 NOTE — Anesthesia Procedure Notes (Signed)
Procedure Name: LMA Insertion Date/Time: 11/14/2022 2:40 PM  Performed by: Lowella Dell, CRNAPre-anesthesia Checklist: Patient identified, Emergency Drugs available, Suction available and Patient being monitored Patient Re-evaluated:Patient Re-evaluated prior to induction Oxygen Delivery Method: Circle System Utilized Preoxygenation: Pre-oxygenation with 100% oxygen Induction Type: IV induction Ventilation: Mask ventilation without difficulty LMA: LMA inserted LMA Size: 4.0 Number of attempts: 1 Airway Equipment and Method: Bite block Placement Confirmation: positive ETCO2 Tube secured with: Tape Dental Injury: Teeth and Oropharynx as per pre-operative assessment

## 2022-11-14 NOTE — Discharge Instructions (Addendum)
INSTRUCTIONS FOR AFTER BREAST SURGERY   You will likely have some questions about what to expect following your operation.  The following information will help you and your family understand what to expect when you are discharged from the hospital.  It is important to follow these guidelines to help ensure a smooth recovery and reduce complication.  Postoperative instructions include information on: diet, wound care, medications and physical activity.  AFTER SURGERY Expect to go home after the procedure.  In some cases, you may need to spend one night in the hospital for observation.  DIET Breast surgery does not require a specific diet.  However, the healthier you eat the better your body will heal. It is important to increasing your protein intake.  This means limiting the foods with sugar and carbohydrates.  Focus on vegetables and some meat.  If you have liposuction during your procedure be sure to drink water.  If your urine is bright yellow, then it is concentrated, and you need to drink more water.  As a general rule after surgery, you should have 8 ounces of water every hour while awake.  If you find you are persistently nauseated or unable to take in liquids let us know.  NO TOBACCO USE or EXPOSURE.  This will slow your healing process and lead to a wound.  WOUND CARE Leave the binder on for 3 days . Use fragrance free soap like Dial, Dove or [place].   After 3 days you can remove the binder to shower. Once dry apply binder or sports bra. If you have liposuction you will have a soft and spongy dressing (Lipofoam) that helps prevent creases in your skin.  Remove before you shower and then replace it.  It is also available on [employer]. If you have steri-strips / tape directly attached to your skin leave them in place. It is OK to get these wet.   No baths, pools or hot tubs for four weeks. We close your incision to leave the smallest and best-looking scar. No ointment or creams on your incisions  for four weeks.  No Neosporin (Too many skin reactions).  A few weeks after surgery you can use Mederma and start massaging the scar. We ask you to wear your binder or sports bra for the first 6 weeks around the clock, including while sleeping. This provides added comfort and helps reduce the fluid accumulation at the surgery site. NO Ice or heating pads to the operative site.  You have a very high risk of a BURN before you feel the temperature change.  ACTIVITY No heavy lifting until cleared by the doctor.  This usually means no more than a half-gallon of milk.  It is OK to walk and climb stairs. Moving your legs is very important to decrease your risk of a blood clot.  It will also help keep you from getting deconditioned.  Every 1 to 2 hours get up and walk for 5 minutes. This will help with a quicker recovery back to normal.  Let pain be your guide so you don't do too much.  This time is for you to recover.  You will be more comfortable if you sleep and rest with your head elevated either with a few pillows under you or in a recliner.  No stomach sleeping for a three months.  WORK Everyone returns to work at different times. As a rough guide, most people take at least 1 - 2 weeks off prior to returning to work. If  you need documentation for your job, give the forms to the front staff at the clinic.  DRIVING Arrange for someone to bring you home from the hospital after your surgery.  You may be able to drive a few days after surgery but not while taking any narcotics or valium.  BOWEL MOVEMENTS Constipation can occur after anesthesia and while taking pain medication.  It is important to stay ahead for your comfort.  We recommend taking Milk of Magnesia (2 tablespoons; twice a day) while taking the pain pills.  MEDICATIONS You may be prescribed should start after surgery At your preoperative visit for you history and physical you may have been given the following medications: An antibiotic: Start  this medication when you get home and take according to the instructions on the bottle. Zofran 4 mg:  This is to treat nausea and vomiting.  You can take this every 6 hours as needed and only if needed. Valium 2 mg for breast cancer patients: This is for muscle tightness if you have an implant or expander. This will help relax your muscle which also helps with pain control.  This can be taken every 12 hours as needed. Don't drive after taking this medication. Norco (hydrocodone/acetaminophen) 5/325 mg:  This is only to be used after you have taken the Motrin or the Tylenol. Every 8 hours as needed.   Over the counter Medication to take: Ibuprofen (Motrin) 600 mg:  Take this every 6 hours.  If you have additional pain then take 500 mg of the Tylenol every 8 hours.  Only take the Norco after you have tried these two. MiraLAX or Milk of Magnesia: Take this according to the bottle if you take the [place] Call your surgeon's office if any of the following occur: Fever 101 degrees F or greater Excessive bleeding or fluid from the incision site. Pain that increases over time without aid from the medications Redness, warmth, or pus draining from incision sites Persistent nausea or inability to take in liquids Severe misshapen area that underwent the operation.  Post Anesthesia Home Care Instructions  Activity: Get plenty of rest for the remainder of the day. A responsible individual must stay with you for 24 hours following the procedure.  For the next 24 hours, DO NOT: -Drive a car -Paediatric nurse -Drink alcoholic beverages -Take any medication unless instructed by your physician -Make any legal decisions or sign important papers.  Meals: Start with liquid foods such as gelatin or soup. Progress to regular foods as tolerated. Avoid greasy, spicy, heavy foods. If nausea and/or vomiting occur, drink only clear liquids until the nausea and/or vomiting subsides. Call your  physician if vomiting continues.  Special Instructions/Symptoms: Your throat may feel dry or sore from the anesthesia or the breathing tube placed in your throat during surgery. If this causes discomfort, gargle with warm salt water. The discomfort should disappear within 24 hours.  If you had a scopolamine patch placed behind your ear for the management of post- operative nausea and/or vomiting:  1. The medication in the patch is effective for 72 hours, after which it should be removed.  Wrap patch in a tissue and discard in the trash. Wash hands thoroughly with soap and water. 2. You may remove the patch earlier than 72 hours if you experience unpleasant side effects which may include dry mouth, dizziness or visual disturbances. 3. Avoid touching the patch. Wash your hands with soap and water after contact with the patch.

## 2022-11-15 ENCOUNTER — Encounter (HOSPITAL_BASED_OUTPATIENT_CLINIC_OR_DEPARTMENT_OTHER): Payer: Self-pay | Admitting: Plastic Surgery

## 2022-11-15 NOTE — Anesthesia Postprocedure Evaluation (Signed)
Anesthesia Post Note  Patient: Marie Nguyen  Procedure(s) Performed: REMOVAL OF TISSUE EXPANDER AND PLACEMENT OF IMPLANT (Bilateral: Breast)     Patient location during evaluation: PACU Anesthesia Type: General Level of consciousness: awake Pain management: pain level controlled Vital Signs Assessment: post-procedure vital signs reviewed and stable Respiratory status: spontaneous breathing, nonlabored ventilation and respiratory function stable Cardiovascular status: blood pressure returned to baseline and stable Postop Assessment: no apparent nausea or vomiting Anesthetic complications: no   No notable events documented.  Last Vitals:  Vitals:   11/14/22 1615 11/14/22 1645  BP: (!) 145/71 (!) 146/60  Pulse: 82 78  Resp: 20 18  Temp:  37 C  SpO2: 99% 97%    Last Pain:  Vitals:   11/14/22 1712  TempSrc:   PainSc: 3                  Marketa Midkiff P Donne Baley

## 2022-11-16 LAB — SURGICAL PATHOLOGY

## 2022-11-19 ENCOUNTER — Other Ambulatory Visit: Payer: Self-pay | Admitting: Family Medicine

## 2022-11-19 DIAGNOSIS — I10 Essential (primary) hypertension: Secondary | ICD-10-CM

## 2022-11-19 NOTE — Telephone Encounter (Signed)
Medication Refill - Medication: lisinopril-hydrochlorothiazide (ZESTORETIC) 10-12.5 MG tablet   Pt asked if it was possible to get a courtesy refill to get her through until appointment. Please advise.    Has the patient contacted their pharmacy? No. No, more refills.  (Agent: If no, request that the patient contact the pharmacy for the refill. If patient does not wish to contact the pharmacy document the reason why and proceed with request.)   Preferred Pharmacy (with phone number or street name):  [place], [address] [address]  [place] [address]  Phone: [phone] Fax: [phone]  Hours: Not open 24 hours    Has the patient been seen for an appointment in the last year OR does the patient have an upcoming appointment? Yes.    Agent: Please be advised that RX refills may take up to 3 business days. We ask that you follow-up with your pharmacy.

## 2022-11-20 MED ORDER — LISINOPRIL-HYDROCHLOROTHIAZIDE 10-12.5 MG PO TABS: 1.0000 | ORAL_TABLET | Freq: Every day | ORAL | 0 refills | Status: DC

## 2022-11-20 NOTE — Telephone Encounter (Signed)
Requested Prescriptions  Pending Prescriptions Disp Refills   lisinopril-hydrochlorothiazide (ZESTORETIC) 10-12.5 MG tablet 90 tablet 0    Sig: Take 1 tablet by mouth daily.     Cardiovascular:  ACEI + Diuretic Combos Failed - 11/19/2022 11:03 AM      Failed - Last BP in normal range    BP Readings from Last 1 Encounters:  11/14/22 (!) 146/60         Passed - Na in normal range and within 180 days    Sodium  Date Value Ref Range Status  11/12/2022 139 135 - 145 mmol/L Final  12/22/2021 144 134 - 144 mmol/L Final         Passed - K in normal range and within 180 days    Potassium  Date Value Ref Range Status  11/12/2022 4.4 3.5 - 5.1 mmol/L Final         Passed - Cr in normal range and within 180 days    Creatinine, Ser  Date Value Ref Range Status  11/12/2022 0.72 0.44 - 1.00 mg/dL Final         Passed - eGFR is 30 or above and within 180 days    GFR calc Af Amer  Date Value Ref Range Status  11/19/2018 102 >59 mL/min/1.73 Final   GFR, Estimated  Date Value Ref Range Status  11/12/2022 >60 >60 mL/min Final    Comment:    (NOTE) Calculated using the CKD-EPI Creatinine Equation (2021)    eGFR  Date Value Ref Range Status  12/22/2021 97 >59 mL/min/1.73 Final         Passed - Patient is not pregnant      Passed - Valid encounter within last 6 months    Recent Outpatient Visits           5 months ago Primary hypertension   [facility] [place], Deanna C, MD   8 months ago Annual physical exam   [facility], Deanna C, MD   11 months ago Primary hypertension   [facility] [place], Deanna C, MD   1 year ago Primary hypertension   [facility] [place], Deanna C, MD   1 year ago Lumbar spondylosis   [place] [facility] [place], Earley Abide, MD       Future Appointments             In 1 week Juline Patch, MD [place] [facility], [facility]

## 2022-11-22 ENCOUNTER — Encounter: Payer: Self-pay | Admitting: Oncology

## 2022-11-22 ENCOUNTER — Inpatient Hospital Stay: Payer: BC Managed Care – PPO | Attending: Oncology | Admitting: Oncology

## 2022-11-22 VITALS — BP 133/84 | HR 70 | Temp 97.5°F | Resp 16 | Ht 67.0 in | Wt 136.0 lb

## 2022-11-22 DIAGNOSIS — C50911 Malignant neoplasm of unspecified site of right female breast: Secondary | ICD-10-CM

## 2022-11-22 DIAGNOSIS — Z9013 Acquired absence of bilateral breasts and nipples: Secondary | ICD-10-CM | POA: Diagnosis not present

## 2022-11-22 DIAGNOSIS — Z170 Estrogen receptor positive status [ER+]: Secondary | ICD-10-CM | POA: Diagnosis not present

## 2022-11-22 DIAGNOSIS — M25612 Stiffness of left shoulder, not elsewhere classified: Secondary | ICD-10-CM | POA: Diagnosis not present

## 2022-11-22 DIAGNOSIS — Z79811 Long term (current) use of aromatase inhibitors: Secondary | ICD-10-CM | POA: Diagnosis not present

## 2022-11-22 DIAGNOSIS — C50912 Malignant neoplasm of unspecified site of left female breast: Secondary | ICD-10-CM | POA: Diagnosis not present

## 2022-11-22 DIAGNOSIS — M8580 Other specified disorders of bone density and structure, unspecified site: Secondary | ICD-10-CM | POA: Insufficient documentation

## 2022-11-22 NOTE — Progress Notes (Signed)
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    INTERVAL HISTORY: Patient returns to clinic today for routine 33-monthevaluation.  She has now completed her reconstruction.  Other than the mild discomfort, she tolerated the procedures well.  She is tolerating letrozole without significant side effects. She has no neurologic complaints.  She denies any recent fevers or illnesses.  She has a good appetite and denies weight loss.  She has no chest pain, shortness of breath, cough, or home this.  She denies any nausea, vomiting, constipation, or diarrhea.  She has no urinary complaints.  Patient offers no specific complaints today.  REVIEW OF SYSTEMS:   Review of Systems  Constitutional: Negative.  Negative for fever, malaise/fatigue and weight loss.  Respiratory: Negative.  Negative for cough, hemoptysis and shortness of breath.   Cardiovascular: Negative.  Negative for chest pain, palpitations and leg swelling.  Gastrointestinal: Negative.  Negative for abdominal pain.  Genitourinary: Negative.  Negative for dysuria.  Musculoskeletal: Negative.  Negative for back pain.  Skin: Negative.  Negative for rash.  Neurological: Negative.  Negative for dizziness, focal weakness, weakness and headaches.  Psychiatric/Behavioral: Negative.  The patient is not nervous/anxious.     As per HPI. Otherwise, a complete review of systems is negative.  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   BREAST BIOPSY Left 05/16/2022   [facility]   BREAST RECONSTRUCTION WITH PLACEMENT OF TISSUE EXPANDER AND ALLODERM Bilateral 07/26/2022   Procedure: BREAST RECONSTRUCTION WITH PLACEMENT OF TISSUE EXPANDER AND ALLODERM;  Surgeon: DWallace Going DO;  Location: [facility] ORS;  Service: Plastics;  Laterality: Bilateral;   CESAREAN SECTION     COLONOSCOPY WITH PROPOFOL N/A 11/24/2018   Procedure: COLONOSCOPY WITH PROPOFOL;  Surgeon: AJonathon Bellows MD;  [facility];  Service: Gastroenterology;  Laterality: N/A;   COLONOSCOPY WITH PROPOFOL N/A 07/03/2021   Procedure: COLONOSCOPY WITH PROPOFOL;  Surgeon: AJonathon Bellows MD;  [facility];  Service: Gastroenterology;  Laterality: N/A;   GANGLION CYST EXCISION Right    wrist   MASTECTOMY Bilateral    REMOVAL OF TISSUE EXPANDER AND PLACEMENT OF IMPLANT Bilateral 11/14/2022   Procedure: REMOVAL OF TISSUE EXPANDER AND PLACEMENT OF IMPLANT;  Surgeon: DWallace Going DO;  [place]  Service: Plastics;  Laterality: Bilateral;   SIMPLE MASTECTOMY WITH AXILLARY SENTINEL NODE BIOPSY Bilateral 07/26/2022   Procedure: SIMPLE MASTECTOMY, ZOtho Ket PA-C to assist;  Surgeon: POlean Ree MD;  Location: [facility] OR[facility];  Service: General;  Laterality: Bilateral;   TOTAL ABDOMINAL HYSTERECTOMY      FAMILY HISTORY: Family History  Problem Relation Age of Onset   Breast cancer Mother 50   Hypertension Mother    Diabetes Father     Hypertension Father    Stroke Father    Hypertension Sister    Colon cancer Maternal Grandfather 42    ADVANCED DIRECTIVES (Y/N):  N  HEALTH MAINTENANCE: Social History   Tobacco Use   Smoking status: Never    Passive exposure: Never   Smokeless tobacco: Never  Vaping Use   Vaping Use: Never used  Substance Use Topics   Alcohol use: Yes    Comment: rarely   Drug use: Never     Colonoscopy:  PAP:  Bone density:  Lipid panel:  Allergies  Allergen Reactions   Latex Rash    Current Outpatient Medications  Medication Sig Dispense Refill   acetaminophen (TYLENOL) 500 MG tablet Take 1,000 mg by mouth every 6 (six) hours as needed for mild pain.     azelastine (ASTELIN) 0.1 % nasal spray Place 1 spray into both nostrils daily. Use in each nostril as directed     diazepam (VALIUM) 2 MG tablet Take 1 tablet (2 mg total) by mouth every 12 (twelve) hours as needed for muscle spasms. 20 tablet 0   fluticasone (FLONASE) 50 MCG/ACT nasal spray Place 1 spray into both nostrils daily as needed for allergies or rhinitis.     ibuprofen (ADVIL) 200 MG tablet Take 200 mg by mouth every 6 (six) hours as needed for mild pain. 2 - 3 tabs     letrozole (FEMARA) 2.5 MG tablet Take 1 tablet (2.5 mg total) by mouth daily. 90 tablet 3   lisinopril-hydrochlorothiazide (ZESTORETIC) 10-12.5 MG tablet Take 1 tablet by mouth daily. 90 tablet 0   Multiple Vitamin (MULTIVITAMIN) tablet Take 1 tablet by mouth daily.     Omega-3 Fatty Acids (FISH OIL) 1000 MG CAPS Take 1 capsule by mouth as needed.     omeprazole (PRILOSEC) 20 MG capsule Take 20 mg by mouth daily.     traMADol (ULTRAM) 50 MG tablet Take 1 tablet (50 mg total) by mouth every 6 (six) hours as needed. 30 tablet 0   triamcinolone cream (KENALOG) 0.1 % 1 Application as needed.     No current facility-administered medications for this visit.    OBJECTIVE: Vitals:   11/22/22 1005  BP: 133/84  Pulse: 70  Resp: 16  Temp: (!) 97.5 F (36.4  C)  SpO2: 100%     Body mass index is 21.3 kg/m.    ECOG FS:0 - Asymptomatic  General: Well-developed, well-nourished, no acute distress. Eyes: Pink conjunctiva, anicteric sclera. HEENT: Normocephalic, moist mucous membranes. Breast: Bilateral mastectomy with reconstruction. Lungs: No audible wheezing or coughing. Heart: Regular rate and rhythm. Abdomen: Soft, nontender, no obvious distention. Musculoskeletal: No edema, cyanosis, or clubbing. Neuro: Alert, answering all questions appropriately. Cranial nerves grossly intact. Skin: No rashes or petechiae noted. Psych: Normal affect.  LAB RESULTS:  Lab Results  Component Value Date   NA 139 11/12/2022   K 4.4 11/12/2022   CL 103 11/12/2022   CO2 25 11/12/2022   GLUCOSE 92 11/12/2022   BUN 11 11/12/2022   CREATININE 0.72 11/12/2022   CALCIUM 9.4 11/12/2022   PROT 6.9 03/10/2021   ALBUMIN 4.7 12/22/2021   AST 18 03/10/2021   ALT 16 03/10/2021   ALKPHOS 58 03/10/2021  BILITOT 1.3 (H) 03/10/2021   GFRNONAA >60 11/12/2022   GFRAA 102 11/19/2018    Lab Results  Component Value Date   WBC 5.4 07/28/2022   NEUTROABS 4.7 03/13/2022   HGB 10.7 (L) 07/28/2022   HCT 32.7 (L) 07/28/2022   MCV 98.8 07/28/2022   PLT 202 07/28/2022     STUDIES: No results found.  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  Oncotype DX 14.  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  Her right breast cancer was a pT1c, left breast cancer was pT1b.  Patient had bilateral mastectomy, therefore there is no need for adjuvant XRT.  Oncotype DX score was low risk at 14, therefore despite 2 primaries patient did not require adjuvant chemotherapy.  Continue letrozole for minimum of 5 years completing treatment in December 2028.  Will consider extending treatment given her high risk disease.  No further interventions are needed.   Return to clinic in 6 months with video-assisted telemedicine visit.   Osteopenia: Bone mineral density on August 24, 2022 revealed a T-score of -2.2.  Have recommended calcium and vitamin D supplementation.  Repeat in December 2024. 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     Patient expressed understanding and was in agreement with this plan. She also understands that She can call clinic at any time with any questions, concerns, or complaints.    Cancer Staging  Bilateral breast cancer [facility]) Staging form: Breast, AJCC 8th Edition - Pathologic stage from 08/22/2022: Stage IA (pT1c, pN0, cM0, G3, ER+, PR+, HER2-, Oncotype DX score: 14) - Signed by Lloyd Huger, MD on 08/22/2022 Stage prefix: Initial diagnosis Multigene prognostic tests performed: Oncotype DX Recurrence score range: Greater than or equal to 11 Histologic grading system: 3 grade system   Lloyd Huger, MD   11/22/2022 12:10 PM    Both right and left breast cancer were stage Ia.

## 2022-11-23 ENCOUNTER — Encounter: Payer: Self-pay | Admitting: Plastic Surgery

## 2022-11-23 ENCOUNTER — Ambulatory Visit (INDEPENDENT_AMBULATORY_CARE_PROVIDER_SITE_OTHER): Payer: BC Managed Care – PPO | Admitting: Plastic Surgery

## 2022-11-23 VITALS — BP 154/91 | HR 80

## 2022-11-23 DIAGNOSIS — C50912 Malignant neoplasm of unspecified site of left female breast: Secondary | ICD-10-CM

## 2022-11-23 DIAGNOSIS — Z9889 Other specified postprocedural states: Secondary | ICD-10-CM

## 2022-11-24 ENCOUNTER — Encounter: Payer: Self-pay | Admitting: Plastic Surgery

## 2022-11-24 NOTE — Progress Notes (Signed)
The patient is a 67 year old female here for follow-up after undergoing exchange of bilateral tissue expanders for implants.  This was done February 21 and she has Mentor smooth round ultra high-profile 400 cc gel implants in place.  She is pleased with the results.  There is no sign of infection or hematoma.  She is a patient of Dr. Mont Dutton.  She was diagnosed with bilateral breast cancer that was estrogen progesterone positive.  She is 5 feet 7 inches tall and weighs 142 pounds.  She had implants placed over 30 years ago under the muscle and they were removed at the time of the mastectomy.  She will follow-up with [place] in 2 weeks in the meantime continue with the sports bra.  No heavy lifting but can increase activity slowly over the next couple of weeks.  Pictures were obtained of the patient and placed in the chart with the patient's or guardian's permission.

## 2022-11-30 ENCOUNTER — Ambulatory Visit: Payer: BC Managed Care – PPO | Admitting: Family Medicine

## 2022-12-04 ENCOUNTER — Ambulatory Visit: Payer: BC Managed Care – PPO | Admitting: Family Medicine

## 2022-12-04 ENCOUNTER — Encounter: Payer: Self-pay | Admitting: Family Medicine

## 2022-12-04 VITALS — BP 120/78 | HR 86 | Ht 67.0 in | Wt 138.0 lb

## 2022-12-04 DIAGNOSIS — I10 Essential (primary) hypertension: Secondary | ICD-10-CM

## 2022-12-04 DIAGNOSIS — R710 Precipitous drop in hematocrit: Secondary | ICD-10-CM

## 2022-12-04 DIAGNOSIS — E7801 Familial hypercholesterolemia: Secondary | ICD-10-CM

## 2022-12-04 MED ORDER — LISINOPRIL-HYDROCHLOROTHIAZIDE 10-12.5 MG PO TABS: 1.0000 | ORAL_TABLET | Freq: Every day | ORAL | 1 refills | Status: DC

## 2022-12-04 NOTE — Progress Notes (Addendum)
Patient is a 66 year old female with history of breast cancer.  She underwent bilateral exchange of tissue expanders for implants and bilateral capsulotomies for implant repositioning on 11/14/2022 Dr. Marla Roe.  Intraoperatively, patient had Mentor smooth round ultrahigh profile gel 400 cc implants placed bilaterally.  She is 3 weeks postop. She presents to the clinic today for postoperative follow-up.  Patient was last seen in the clinic on 11/23/2022.  At this visit, she was doing well.  She was pleased with her results.  There is no sign of infection or hematoma.  Plan was for patient to follow-up in 2 weeks.  Today, patient reports she is doing well.  She states that she has a little bit of soreness laterally, that she states has been improving since surgery.  She states that the soreness is helped by taking ibuprofen.  Patient also states that she notices she does not have as much volume more so medially to the left breast as she does to the right breast.  Patient also inquiring about a lump to her right axillary region.  She states that this firm lump has been present since her initial surgery and has not grown in size.  She denies any skin changes over the lump.  Otherwise patient has no complaints or concerns.  She denies any fevers or chills.  Chaperone present on exam.  On exam, patient is sitting upright in no acute distress.  Breasts are soft and fairly symmetric bilaterally.  There is some very mild volume loss noted to the medial aspect of the left breast.  There is no overlying erythema to either breast.  There are no fluid collections palpated on exam.  Steri-Strips were intact bilaterally.  These were removed.  Incision is intact and healing well.  There is no surrounding erythema, drainage or swelling.  There are no open wounds noted on exam.  To the right axillary region, there is a small somewhat firm lump noted.  It is mobile.  There are no overlying skin changes to this area.  I  discussed with the patient that she seems to be healing well from a postoperative standpoint.  I discussed with her that she can apply Vaseline to her incisions daily.  I also discussed with her that she should continue to wear compression at all times and avoid strenuous activities.  Patient expressed understanding.  I discussed with the patient that if she still bothered by the volume loss to the medial left breast, fat grafting could be a possible option for her later on.  Patient states that she is not interested in this at this time.  I discussed with the patient that the lump in her axillary region should be further investigated.  I recommended that the patient either follow back up with her general surgeon or we get some imaging of it.  Patient states that she would like to call her general surgeon first prior to ordering imaging.  I discussed with her that if she has any issues getting in with her general surgeon, to let us know.  Patient expressed understanding and was in agreement with this plan.  Patient to follow back up in 2 weeks.  I instructed the patient to call back in the meantime if she has any questions or concerns.  Pictures were obtained of the patient and placed in the chart with the patient's or guardian's permission.

## 2022-12-04 NOTE — Progress Notes (Signed)
Date:  12/04/2022   Name:  Marie Nguyen   DOB:  [birth year]   MRN:  [id number]   Chief Complaint: Hypertension, Hyperlipidemia, and low hemoglobin (S/p surg 4 months ago)  Hypertension This is a chronic problem. The problem has been gradually improving since onset. Pertinent negatives include no chest pain, palpitations or shortness of breath. There are no associated agents to hypertension. Risk factors for coronary artery disease include dyslipidemia. Past treatments include ACE inhibitors and diuretics. The current treatment provides moderate improvement. There are no compliance problems.  There is no history of CAD/MI or CVA. There is no history of chronic renal disease, a hypertension causing med or renovascular disease.  Hyperlipidemia The current episode started more than 1 year ago. Recent lipid tests were reviewed and are normal. She has no history of chronic renal disease. Pertinent negatives include no chest pain or shortness of breath. Current antihyperlipidemic treatment includes diet change. The current treatment provides moderate improvement of lipids. Risk factors for coronary artery disease include dyslipidemia.  Anemia Presents for follow-up visit. There has been no abdominal pain or palpitations. There is no history of chronic renal disease.    Lab Results  Component Value Date   NA 139 11/12/2022   K 4.4 11/12/2022   CO2 25 11/12/2022   GLUCOSE 92 11/12/2022   BUN 11 11/12/2022   CREATININE 0.72 11/12/2022   CALCIUM 9.4 11/12/2022   EGFR 97 12/22/2021   GFRNONAA >60 11/12/2022   Lab Results  Component Value Date   CHOL 201 (H) 12/22/2021   HDL 85 12/22/2021   LDLCALC 99 12/22/2021   TRIG 97 12/22/2021   Lab Results  Component Value Date   TSH 2.810 11/19/2018   No results found for: "HGBA1C" Lab Results  Component Value Date   WBC 5.4 07/28/2022   HGB 10.7 (L) 07/28/2022   HCT 32.7 (L) 07/28/2022   MCV 98.8 07/28/2022   PLT 202 07/28/2022    Lab Results  Component Value Date   ALT 16 03/10/2021   AST 18 03/10/2021   ALKPHOS 58 03/10/2021   BILITOT 1.3 (H) 03/10/2021   No results found for: "25OHVITD2", "25OHVITD3", "VD25OH"   Review of Systems  Constitutional:  Negative for unexpected weight change.  HENT:  Negative for trouble swallowing.   Respiratory:  Negative for chest tightness, shortness of breath and wheezing.   Cardiovascular:  Negative for chest pain, palpitations and leg swelling.  Gastrointestinal:  Negative for abdominal pain.  Endocrine: Negative for polydipsia and polyuria.  Genitourinary:  Negative for difficulty urinating and menstrual problem.    Patient Active Problem List   Diagnosis Date Noted   Genetic testing 10/23/2022   Bilateral breast cancer ([place]) 07/26/2022   H/O total hysterectomy 12/22/2021   Lumbar spondylosis 09/20/2021   Right hip pain 09/20/2021   Skin nodule 06/30/2013    Allergies  Allergen Reactions   Latex Rash    Past Surgical History:  Procedure Laterality Date   ABDOMINAL HYSTERECTOMY     AUGMENTATION MAMMAPLASTY     AXILLARY SENTINEL NODE BIOPSY Bilateral 07/26/2022   Procedure: AXILLARY SENTINEL NODE BIOPSY;  Surgeon: Olean Ree, MD;  Location: [facility];  Service: General;  Laterality: Bilateral;   BREAST BIOPSY Right    neg   BREAST BIOPSY Right 05/16/2022   x 2 areas/ [place] bx & Stereo bx coil clip/INC and DCIS   BREAST BIOPSY Left 05/16/2022   [place] bx/ [facility] with DCIS   BREAST RECONSTRUCTION WITH  PLACEMENT OF TISSUE EXPANDER AND ALLODERM Bilateral 07/26/2022   Procedure: BREAST RECONSTRUCTION WITH PLACEMENT OF TISSUE EXPANDER AND ALLODERM;  Surgeon: Wallace Going, DO;  Location: [facility];  Service: Plastics;  Laterality: Bilateral;   CESAREAN SECTION     COLONOSCOPY WITH PROPOFOL N/A 11/24/2018   Procedure: COLONOSCOPY WITH PROPOFOL;  Surgeon: Jonathon Bellows, MD;  Location: [facility] END[facility]COPY;  Service: Gastroenterology;  Laterality: N/A;   COLONOSCOPY WITH  PROPOFOL N/A 07/03/2021   Procedure: COLONOSCOPY WITH PROPOFOL;  Surgeon: Jonathon Bellows, MD;  Location: [facility] ENDOSCOPY;  Service: Gastroenterology;  Laterality: N/A;   GANGLION CYST EXCISION Right    wrist   MASTECTOMY Bilateral    REMOVAL OF TISSUE EXPANDER AND PLACEMENT OF IMPLANT Bilateral 11/14/2022   Procedure: REMOVAL OF TISSUE EXPANDER AND PLACEMENT OF IMPLANT;  Surgeon: Wallace Going, DO;  Location: [place];  Service: Plastics;  Laterality: Bilateral;   SIMPLE MASTECTOMY WITH AXILLARY SENTINEL NODE BIOPSY Bilateral 07/26/2022   Procedure: SIMPLE MASTECTOMY, Otho Ket, PA-C to assist;  Surgeon: Olean Ree, MD;  Location: [facility];  Service: General;  Laterality: Bilateral;   TOTAL ABDOMINAL HYSTERECTOMY      Social History   Tobacco Use   Smoking status: Never    Passive exposure: Never   Smokeless tobacco: Never  Vaping Use   Vaping Use: Never used  Substance Use Topics   Alcohol use: Yes    Comment: rarely   Drug use: Never     Medication list has been reviewed and updated.  Current Meds  Medication Sig   acetaminophen (TYLENOL) 500 MG tablet Take 1,000 mg by mouth every 6 (six) hours as needed for mild pain.   fluticasone (FLONASE) 50 MCG/ACT nasal spray Place 1 spray into both nostrils daily as needed for allergies or rhinitis.   ibuprofen (ADVIL) 200 MG tablet Take 200 mg by mouth every 6 (six) hours as needed for mild pain. 2 - 3 tabs   letrozole (FEMARA) 2.5 MG tablet Take 1 tablet (2.5 mg total) by mouth daily.   lisinopril-hydrochlorothiazide (ZESTORETIC) 10-12.5 MG tablet Take 1 tablet by mouth daily.   Multiple Vitamin (MULTIVITAMIN) tablet Take 1 tablet by mouth daily.   Omega-3 Fatty Acids (FISH OIL) 1000 MG CAPS Take 1 capsule by mouth as needed.   omeprazole (PRILOSEC) 20 MG capsule Take 20 mg by mouth daily.   [DISCONTINUED] diazepam (VALIUM) 2 MG tablet Take 1 tablet (2 mg total) by mouth every 12 (twelve) hours as needed for  muscle spasms.   [DISCONTINUED] triamcinolone cream (KENALOG) 0.1 % 1 Application as needed.       12/04/2022   10:05 AM 06/01/2022    9:04 AM 03/13/2022    8:50 AM 12/22/2021    8:15 AM  GAD 7 : Generalized Anxiety Score  Nervous, Anxious, on Edge 0 0 0 0  Control/stop worrying 0 0 0 0  Worry too much - different things 0 0 0 0  Trouble relaxing 0 0 0 0  Restless 0 0 0 0  Easily annoyed or irritable 0 0 0 0  Afraid - awful might happen 0 0 0 0  Total GAD 7 Score 0 0 0 0  Anxiety Difficulty Not difficult at all Not difficult at all Not difficult at all Not difficult at all       12/04/2022   10:05 AM 06/01/2022    9:04 AM 03/13/2022    8:50 AM  Depression screen PHQ 2/9  Decreased Interest 0 0  0  Down, Depressed, Hopeless 0 0 0  PHQ - 2 Score 0 0 0  Altered sleeping 0 0 0  Tired, decreased energy 0 0 0  Change in appetite 0 0 0  Feeling bad or failure about yourself  0 0 0  Trouble concentrating 0 0 0  Moving slowly or fidgety/restless 0 0 0  Suicidal thoughts 0 0 0  PHQ-9 Score 0 0 0  Difficult doing work/chores Not difficult at all Not difficult at all Not difficult at all    BP Readings from Last 3 Encounters:  12/04/22 120/78  11/23/22 (!) 154/91  11/22/22 133/84    Physical Exam Vitals and nursing note reviewed. Exam conducted with a chaperone present.  Constitutional:      General: She is not in acute distress.    Appearance: She is not diaphoretic.  HENT:     Head: Normocephalic and atraumatic.     Right Ear: Tympanic membrane, ear canal and external ear normal.     Left Ear: Tympanic membrane, ear canal and external ear normal.     Nose: Nose normal.     Mouth/Throat:     Mouth: Mucous membranes are moist.  Eyes:     General:        Right eye: No discharge.        Left eye: No discharge.     Conjunctiva/sclera: Conjunctivae normal.     Pupils: Pupils are equal, round, and reactive to light.  Neck:     Thyroid: No thyromegaly.     Vascular: No JVD.   Cardiovascular:     Rate and Rhythm: Normal rate and regular rhythm.     Heart sounds: Normal heart sounds. No murmur heard.    No friction rub. No gallop.  Pulmonary:     Effort: Pulmonary effort is normal.     Breath sounds: Normal breath sounds. No wheezing, rhonchi or rales.  Abdominal:     General: Bowel sounds are normal.     Palpations: Abdomen is soft. There is no mass.     Tenderness: There is no abdominal tenderness. There is no guarding.  Musculoskeletal:     Cervical back: Normal range of motion and neck supple.  Lymphadenopathy:     Cervical: No cervical adenopathy.  Skin:    General: Skin is warm and dry.  Neurological:     Mental Status: She is alert.     Wt Readings from Last 3 Encounters:  12/04/22 138 lb (62.6 kg)  11/22/22 136 lb (61.7 kg)  11/14/22 133 lb 13.1 oz (60.7 kg)    BP 120/78   Pulse 86   Ht '5\' 7"'$  (1.702 m)   Wt 138 lb (62.6 kg)   SpO2 95%   BMI 21.61 kg/m   Assessment and Plan: 1. Primary hypertension Chronic.  Controlled.  Stable.  Blood pressure 120/78.  Asymptomatic.  Tolerating medications well.  Will continue with lisinopril hydrochlorothiazide 10-12.5 mg once a day.  Review of previous basic metabolic panel was normal from 3 weeks ago.  Will recheck patient in 6 months. - lisinopril-hydrochlorothiazide (ZESTORETIC) 10-12.5 MG tablet; Take 1 tablet by mouth daily.  Dispense: 90 tablet; Refill: 1  2. Familial hypercholesterolemia Chronic.  Diet controlled.  Continue lipid management with low-cholesterol low triglyceride dietary guidelines.  Will check lipid panel for current level of control. - Lipid Panel With LDL/HDL Ratio  3. Decreased hemoglobin Review of previous CBC noted hemoglobin in the 10.7 range on 07/28/2022 which is  status post double mastectomy.  Will check CBC to see if hemoglobin has returned to normal level. - CBC w/Diff/Platelet     Otilio Miu, MD

## 2022-12-05 ENCOUNTER — Encounter: Payer: Self-pay | Admitting: Student

## 2022-12-05 ENCOUNTER — Ambulatory Visit (INDEPENDENT_AMBULATORY_CARE_PROVIDER_SITE_OTHER): Payer: BC Managed Care – PPO | Admitting: Student

## 2022-12-05 VITALS — BP 150/89 | HR 61

## 2022-12-05 DIAGNOSIS — Z9889 Other specified postprocedural states: Secondary | ICD-10-CM

## 2022-12-05 DIAGNOSIS — C50911 Malignant neoplasm of unspecified site of right female breast: Secondary | ICD-10-CM

## 2022-12-05 LAB — CBC WITH DIFFERENTIAL/PLATELET
Basophils Absolute: 0.1 10*3/uL (ref 0.0–0.2)
Basos: 2 %
EOS (ABSOLUTE): 0.2 10*3/uL (ref 0.0–0.4)
Eos: 5 %
Hematocrit: 41.2 % (ref 34.0–46.6)
Hemoglobin: 13.5 g/dL (ref 11.1–15.9)
Immature Grans (Abs): 0 10*3/uL (ref 0.0–0.1)
Immature Granulocytes: 0 %
Lymphocytes Absolute: 1.3 10*3/uL (ref 0.7–3.1)
Lymphs: 28 %
MCH: 31.5 pg (ref 26.6–33.0)
MCHC: 32.8 g/dL (ref 31.5–35.7)
MCV: 96 fL (ref 79–97)
Monocytes Absolute: 0.5 10*3/uL (ref 0.1–0.9)
Monocytes: 10 %
Neutrophils Absolute: 2.6 10*3/uL (ref 1.4–7.0)
Neutrophils: 55 %
Platelets: 269 10*3/uL (ref 150–450)
RBC: 4.29 x10E6/uL (ref 3.77–5.28)
RDW: 12 % (ref 11.7–15.4)
WBC: 4.7 10*3/uL (ref 3.4–10.8)

## 2022-12-05 LAB — LIPID PANEL WITH LDL/HDL RATIO
Cholesterol, Total: 208 mg/dL — ABNORMAL HIGH (ref 100–199)
HDL: 71 mg/dL (ref >39)
LDL Chol Calc (NIH): 124 mg/dL — ABNORMAL HIGH (ref 0–99)
LDL/HDL Ratio: 1.7 ratio (ref 0.0–3.2)
Triglycerides: 75 mg/dL (ref 0–149)
VLDL Cholesterol Cal: 13 mg/dL (ref 5–40)

## 2022-12-10 ENCOUNTER — Ambulatory Visit (INDEPENDENT_AMBULATORY_CARE_PROVIDER_SITE_OTHER): Payer: BC Managed Care – PPO | Admitting: Surgery

## 2022-12-10 ENCOUNTER — Encounter: Payer: Self-pay | Admitting: Surgery

## 2022-12-10 VITALS — BP 152/87 | HR 75 | Ht 67.0 in | Wt 137.0 lb

## 2022-12-10 DIAGNOSIS — C50412 Malignant neoplasm of upper-outer quadrant of left female breast: Secondary | ICD-10-CM | POA: Diagnosis not present

## 2022-12-10 DIAGNOSIS — C50811 Malignant neoplasm of overlapping sites of right female breast: Secondary | ICD-10-CM

## 2022-12-10 DIAGNOSIS — Z170 Estrogen receptor positive status [ER+]: Secondary | ICD-10-CM

## 2022-12-10 DIAGNOSIS — N6489 Other specified disorders of breast: Secondary | ICD-10-CM

## 2022-12-10 DIAGNOSIS — R2231 Localized swelling, mass and lump, right upper limb: Secondary | ICD-10-CM | POA: Diagnosis not present

## 2022-12-10 NOTE — Addendum Note (Signed)
Addended by: Gaspar Cola L on: 12/10/2022 11:09 AM   Modules accepted: Orders

## 2022-12-10 NOTE — Patient Instructions (Addendum)
U/s scheduled  for  12/14/2022 @ 1:40 pm .Will call with results

## 2022-12-10 NOTE — Progress Notes (Signed)
12/10/2022  History of Present Illness: Marie Nguyen is a 67 y.o. female s/p bilateral mastectomy, bilateral SLNBx, and bilateral tissue expanders on 07/26/22 with me and Dr. Marla Roe for bilateral breast cancer.  She had the tissue expanders removed and placement of bilateral implants on 11/14/22.  She reports that since the first surgery, she's had a lump in the right axilla.  It does not hurt her, but she feels it's there.  She was told this could be scar tissue and to try to massage it to break it up.  She saw Dr. Coralee Rud team on 12/05/22 for this lump and was recommended to follow up with me for further evaluation.  Past Medical History: Past Medical History:  Diagnosis Date   Allergy    Arthritis    Eczema    GERD (gastroesophageal reflux disease)    Heart murmur    Hypertension      Past Surgical History: Past Surgical History:  Procedure Laterality Date   ABDOMINAL HYSTERECTOMY     AUGMENTATION MAMMAPLASTY     AXILLARY SENTINEL NODE BIOPSY Bilateral 07/26/2022   Procedure: AXILLARY SENTINEL NODE BIOPSY;  Surgeon: Olean Ree, MD;  Location: [facility]S;  Service: General;  Laterality: Bilateral;   BREAST BIOPSY Right    neg   BREAST BIOPSY Right 05/16/2022   x 2 areas/ [place] bx & Stereo bx coil clip/INC and DCIS   BREAST BIOPSY Left 05/16/2022   [place] bx/ [facility] with DCIS   BREAST RECONSTRUCTION WITH PLACEMENT OF TISSUE EXPANDER AND ALLODERM Bilateral 07/26/2022   Procedure: BREAST RECONSTRUCTION WITH PLACEMENT OF TISSUE EXPANDER AND ALLODERM;  Surgeon: Wallace Going, DO;  Location: [facility];  Service: Plastics;  Laterality: Bilateral;   CESAREAN SECTION     COLONOSCOPY WITH PROPOFOL N/A 11/24/2018   Procedure: COLONOSCOPY WITH PROPOFOL;  Surgeon: Jonathon Bellows, MD;  Location: [facility] ENDOSCOPY;  Service: Gastroenterology;  Laterality: N/A;   COLONOSCOPY WITH PROPOFOL N/A 07/03/2021   Procedure: COLONOSCOPY WITH PROPOFOL;  Surgeon: Jonathon Bellows, MD;  Location: [facility]  ENDOSCOPY;  Service: Gastroenterology;  Laterality: N/A;   GANGLION CYST EXCISION Right    wrist   MASTECTOMY Bilateral    REMOVAL OF TISSUE EXPANDER AND PLACEMENT OF IMPLANT Bilateral 11/14/2022   Procedure: REMOVAL OF TISSUE EXPANDER AND PLACEMENT OF IMPLANT;  Surgeon: Wallace Going, DO;  Location: [place];  Service: Plastics;  Laterality: Bilateral;   SIMPLE MASTECTOMY WITH AXILLARY SENTINEL NODE BIOPSY Bilateral 07/26/2022   Procedure: SIMPLE MASTECTOMY, Otho Ket, PA-C to assist;  Surgeon: Olean Ree, MD;  Location: [facility] OR[facility];  Service: General;  Laterality: Bilateral;   TOTAL ABDOMINAL HYSTERECTOMY      Home Medications: Prior to Admission medications   Medication Sig Start Date End Date Taking? Authorizing Provider  acetaminophen (TYLENOL) 500 MG tablet Take 1,000 mg by mouth every 6 (six) hours as needed for mild pain.    [provider]  azelastine (ASTELIN) 0.1 % nasal spray Place 1 spray into both nostrils daily. Use in each nostril as directed    [provider]  fluticasone (FLONASE) 50 MCG/ACT nasal spray Place 1 spray into both nostrils daily as needed for allergies or rhinitis.    [provider]  ibuprofen (ADVIL) 200 MG tablet Take 200 mg by mouth every 6 (six) hours as needed for mild pain. 2 - 3 tabs    [provider]  letrozole (FEMARA) 2.5 MG tablet Take 1 tablet (2.5 mg total) by mouth daily. 08/22/22  Lloyd Huger, MD  lisinopril-hydrochlorothiazide (ZESTORETIC) 10-12.5 MG tablet Take 1 tablet by mouth daily. 12/04/22   Juline Patch, MD  Multiple Vitamin (MULTIVITAMIN) tablet Take 1 tablet by mouth daily.    [provider]  Omega-3 Fatty Acids (FISH OIL) 1000 MG CAPS Take 1 capsule by mouth as needed.    [provider]  omeprazole (PRILOSEC) 20 MG capsule Take 20 mg by mouth daily.    [provider]    Allergies: Allergies  Allergen Reactions   Latex Rash     Review of Systems: Review of Systems  Constitutional:  Negative for chills and fever.  Respiratory:  Negative for shortness of breath.   Cardiovascular:  Negative for chest pain.  Gastrointestinal:  Negative for abdominal pain, nausea and vomiting.  Skin:        Right axillary mass    Physical Exam BP (!) 152/87   Pulse 75   Ht 5\' 7"  (1.702 m)   Wt 137 lb (62.1 kg)   BMI 21.46 kg/m  CONSTITUTIONAL: No acute distress, well nourished. HEENT:  Normocephalic, atraumatic, extraocular motion intact. RESPIRATORY:  Normal respiratory effort without pathologic use of accessory muscles. CARDIOVASCULAR: Regular rhythm and rate SKIN:  Right axilla has a SLNBx scar that is well healed.  Just deep to the scar, the patient has what appears to be a bilobed fluid collection such as a seroma.  It is very mobile and non-tender. NEUROLOGIC:  Motor and sensation is grossly normal.  Cranial nerves are grossly intact. PSYCH:  Alert and oriented to person, place and time. Affect is normal.   Assessment and Plan: This is a 67 y.o. female with right axillary mass s/p bilateral mastectomy and bilateral SLNBx.  --Discussed with the patient that most likely this is a seroma that formed after surgery.  It is well encapsulated, feels to be bilobed in shape.  It is very mobile and non-tender.  No overlying skin changes.  I think this is too superficial and mobile to be an enlarged lymph node.  However, discussed with the patient that we would obtain an ultrasound of the right axilla to further evaluate.  It is just a seroma, then this could be just watched/monitored unless it gives her any pain.  If it is more suspicious, may need a biopsy. --Will call the patient with the results. --Patient otherwise will be following up with me around May for her bilateral breast cancer.  I spent 30 minutes dedicated to the care of this patient on the date of this encounter to include pre-visit review of records,  face-to-face time with the patient discussing diagnosis and management, and any post-visit coordination of care.   Melvyn Neth, [facility]

## 2022-12-14 ENCOUNTER — Ambulatory Visit
Admission: RE | Admit: 2022-12-14 | Discharge: 2022-12-14 | Disposition: A | Discharge status: Home / Self Care | Payer: BC Managed Care – PPO | Source: Ambulatory Visit | Attending: Surgery | Admitting: Surgery

## 2022-12-14 DIAGNOSIS — N6489 Other specified disorders of breast: Secondary | ICD-10-CM | POA: Insufficient documentation

## 2022-12-14 DIAGNOSIS — C50811 Malignant neoplasm of overlapping sites of right female breast: Secondary | ICD-10-CM | POA: Diagnosis not present

## 2022-12-14 DIAGNOSIS — M7989 Other specified soft tissue disorders: Secondary | ICD-10-CM | POA: Diagnosis not present

## 2022-12-14 DIAGNOSIS — R2231 Localized swelling, mass and lump, right upper limb: Secondary | ICD-10-CM | POA: Insufficient documentation

## 2022-12-17 ENCOUNTER — Telehealth: Payer: Self-pay

## 2022-12-17 NOTE — Telephone Encounter (Signed)
Message left for the patient letting her know we have the results of her ultrasound and it shows a seroma. If she would like for [place] to aspirate this she can let us know. If not she will follow up as noted in May for a recheck.

## 2022-12-18 NOTE — Progress Notes (Unsigned)
Patient

## 2022-12-19 ENCOUNTER — Encounter: Payer: Self-pay | Admitting: Licensed Clinical Social Worker

## 2022-12-19 ENCOUNTER — Ambulatory Visit (INDEPENDENT_AMBULATORY_CARE_PROVIDER_SITE_OTHER): Payer: BC Managed Care – PPO | Admitting: Student

## 2022-12-19 ENCOUNTER — Encounter: Payer: Self-pay | Admitting: Student

## 2022-12-19 VITALS — BP 137/91 | HR 79 | Ht 67.0 in | Wt 133.0 lb

## 2022-12-19 DIAGNOSIS — Z9889 Other specified postprocedural states: Secondary | ICD-10-CM

## 2022-12-19 NOTE — Progress Notes (Signed)
UPDATE: VUS in [place] called [id number] has been reclassified to "Likely Benign." The amended report date is 12/18/2022.

## 2023-01-16 NOTE — Progress Notes (Signed)
Patient is a pleasant 67 year old female with PMH of breast cancer s/p bilateral mastectomy with implant-based reconstruction.  She presents to clinic today to discuss nipple areolar restoration tattooing.  Reviewed her operative report from 11/14/2022 and the implants were placed submuscularly.  Today, she tells me that she is interested in hearing more about nipple areolar tattoo restoration.  After discussion, it does sound compelling.  Provided patient with a couple of temporary nipple tattoos and encouraged her to apply them at home and see if she would like to proceed with permanent nipple areolar restoration tattooing.  She did inquire about the personal financial responsibilities to which I encouraged her to ask her insurance company.  I explained that it can vary based on an individual's deductible and max OOP.    She tells me that she has a small bump on the superior aspect of her left breast that she has had since her mastectomy.  She states that it was initially indented, but in the past month has gotten considerably larger.  There has been the same overlying skin changes x 6+ months.  On exam, it feels discrete, mobile.  Nontender.  No obvious fluctuance.  This is not particularly common and I cannot definitively tell the patient whether not this is reflective of an encapsulated seroma, cystic lesion, or something else.  She has been occasionally massaging the area without any improvement.  Given the chronicity as well as her recent changes, discussed obtaining imaging for further evaluation.  She expressed that she would like to move forward with ultrasound for further clarification rather than continued watchful waiting.  Patient also mentioned that she has what she believes to be a lipoma on the medial aspect of left upper leg.  She states that since losing weight, she feels as though it is more pronounced and bothersome.  She is interested in surgical excision.  Informed patient that she  will need to schedule a consult with Dr. Ulice Bold for further discussion.  Also scheduled a telephone encounter in 3 to 4 weeks with me to discuss the results of her ultrasound as well as whether or not she would like to move forward with the nipple areolar tattoo restoration.

## 2023-01-18 ENCOUNTER — Ambulatory Visit: Payer: BC Managed Care – PPO | Admitting: Physician Assistant

## 2023-01-18 DIAGNOSIS — N6320 Unspecified lump in the left breast, unspecified quadrant: Secondary | ICD-10-CM

## 2023-01-18 DIAGNOSIS — Z9889 Other specified postprocedural states: Secondary | ICD-10-CM

## 2023-01-31 NOTE — Progress Notes (Signed)
Patient is a pleasant 67 year old female with PMH of breast cancer s/p bilateral mastectomy with implant-based reconstruction.  She was last seen here in clinic on 01/21/2023.  At that time, discussed nipple areolar tattoo restoration.  Additionally, she complained of a small bump on the superior aspect of her left breast which she states has been present since her mastectomy and has been getting progressively larger.  She then inquired about possible lipoma excision on medial aspect of left upper leg, but asked that she follow-up with Dr. Ulice Bold for a surgical consult in a couple of months for further discussion.  Today, she presents to clinic after having ultrasound imaging performed to the left breast.  See result below.  Lesion is 1.3 x 1.7 cm, no sonographic evidence of malignancy.  Possible epidermal inclusion cyst.  There was no evidence concerning for infected cyst at last encounter and she tells me that it waxes and wanes in terms of its size.  She states that it is not optically bothersome at this time.  As for her NAC tattoo restoration, she states that she would like to proceed.  She understands that it would be covered by insurance, but that there would be an out-of-pocket portion based on her deductible.  She would like to hold off until she is seen by Dr. Ulice Bold for consultation next week.  If we end up taking her to surgery for excision of possible lipoma medial thigh, then she would like to pursue NAC tattooing.  Otherwise, she is uncertain whether or not she would like to proceed given the cost.  Will continue to follow and will remain available should she like to proceed.   FINDINGS: On physical exam, there is a superficial mass at the site of palpable concern which is soft and fluctuant. No discrete pore is identified to the skin surface.   Targeted ultrasound was performed of the LEFT upper inner breast. Within the skin, there is an oval circumscribed anechoic mass  with posterior acoustic enhancement. It measures 13 x 17 x 4 mm. No suspicious internal nodularity or vascularity.   IMPRESSION: There is a benign intradermal fluid collection at the site of palpable concern. This may reflect a benign intradermal mass such as an epidermal inclusion cyst or other cystic intradermal mass. No sonographic evidence of malignancy.   RECOMMENDATION: Recommend clinical management of the palpable area in the LEFT upper inner breast; no dedicated imaging follow-up is recommended for this benign finding.   Recommend continued clinical follow-up for patient status post bilateral mastectomy with any imaging as per clinical team preference.   I have discussed the findings and recommendations with the patient. If applicable, a reminder letter will be sent to the patient regarding the next appointment.   BI-RADS CATEGORY  2: Benign.

## 2023-02-01 ENCOUNTER — Ambulatory Visit (INDEPENDENT_AMBULATORY_CARE_PROVIDER_SITE_OTHER): Payer: BC Managed Care – PPO | Admitting: Physician Assistant

## 2023-02-01 ENCOUNTER — Ambulatory Visit
Admission: RE | Admit: 2023-02-01 | Discharge: 2023-02-01 | Disposition: A | Discharge status: Home / Self Care | Payer: BC Managed Care – PPO | Source: Ambulatory Visit | Attending: Physician Assistant | Admitting: Physician Assistant

## 2023-02-01 DIAGNOSIS — N6322 Unspecified lump in the left breast, upper inner quadrant: Secondary | ICD-10-CM | POA: Diagnosis not present

## 2023-02-01 DIAGNOSIS — N6320 Unspecified lump in the left breast, unspecified quadrant: Secondary | ICD-10-CM | POA: Insufficient documentation

## 2023-02-01 DIAGNOSIS — Z9889 Other specified postprocedural states: Secondary | ICD-10-CM

## 2023-02-06 ENCOUNTER — Ambulatory Visit: Payer: BC Managed Care – PPO | Admitting: Surgery

## 2023-02-08 ENCOUNTER — Ambulatory Visit: Payer: BC Managed Care – PPO | Admitting: Plastic Surgery

## 2023-02-08 VITALS — BP 158/96 | HR 74 | Ht 67.0 in | Wt 139.4 lb

## 2023-02-08 DIAGNOSIS — R2242 Localized swelling, mass and lump, left lower limb: Secondary | ICD-10-CM

## 2023-02-08 DIAGNOSIS — Z9889 Other specified postprocedural states: Secondary | ICD-10-CM

## 2023-02-08 DIAGNOSIS — R2240 Localized swelling, mass and lump, unspecified lower limb: Secondary | ICD-10-CM | POA: Insufficient documentation

## 2023-02-08 DIAGNOSIS — Z9013 Acquired absence of bilateral breasts and nipples: Secondary | ICD-10-CM | POA: Insufficient documentation

## 2023-02-08 DIAGNOSIS — C50911 Malignant neoplasm of unspecified site of right female breast: Secondary | ICD-10-CM

## 2023-02-08 NOTE — Progress Notes (Signed)
   Subjective:    Patient ID: Marie Nguyen, female    DOB: 03/22/1956, 67 y.o.   MRN: [id number]  The patient is a 67 year old female here for evaluation of a mass on her left inner thigh.  She was originally seen for breast cancer in September 2023.  She was diagnosed with multifocal disease of her breasts.  She is 5 feet 7 inches tall and weighs 142 pounds.  She had implants placed over 30 years ago.  She underwent bilateral mastectomies November 2023 with expanders placed.  In February 2024 she had removal of the expanders and placement of smooth round ultra high-profile 400 cc gel implants.  He is doing very well and has recovered from all of that.  She is now concerned about this mass on her inner thigh that has been getting quite larger over the last couple of months.  She wears a compression pair of shorts to try and conceal it.  She has not had any imaging of it that she is aware of.  We talked about doing an ultrasound to be sure there is not any excess blood supply to it.  Nothing seems to make it better and she is certainly not overweight in any way.      Review of Systems  Constitutional: Negative.   HENT: Negative.    Eyes: Negative.   Respiratory: Negative.    Cardiovascular: Negative.   Gastrointestinal: Negative.   Endocrine: Negative.   Genitourinary: Negative.   Musculoskeletal: Negative.   Hematological: Negative.   Psychiatric/Behavioral: Negative.         Objective:   Physical Exam Vitals and nursing note reviewed.  Constitutional:      Appearance: Normal appearance.  HENT:     Head: Normocephalic and atraumatic.  Cardiovascular:     Rate and Rhythm: Normal rate.     Pulses: Normal pulses.  Skin:    General: Skin is warm.     Capillary Refill: Capillary refill takes less than 2 seconds.     Coloration: Skin is not jaundiced.     Findings: No bruising.  Neurological:     Mental Status: She is alert and oriented to person, place, and time.   Psychiatric:        Mood and Affect: Mood normal.        Behavior: Behavior normal.        Thought Content: Thought content normal.        Judgment: Judgment normal.       Assessment & Plan:     ICD-10-CM   1. Bilateral malignant neoplasm of breast in female, unspecified estrogen receptor status, unspecified site of breast (HCC)  C50.911    C50.912     2. Acquired absence of breast and absent nipple, bilateral  Z90.13     3. Mass of left thigh  R22.42       Pictures were obtained of the patient and placed in the chart with the patient's or guardian's permission.  Recommend excision but I would like to get an ultrasound first to make sure there is not any underlying issues.  The patient is okay with that.

## 2023-03-11 ENCOUNTER — Ambulatory Visit
Admission: RE | Admit: 2023-03-11 | Discharge: 2023-03-11 | Disposition: A | Discharge status: Home / Self Care | Payer: BC Managed Care – PPO | Source: Ambulatory Visit | Attending: Plastic Surgery | Admitting: Plastic Surgery

## 2023-03-11 DIAGNOSIS — R2242 Localized swelling, mass and lump, left lower limb: Secondary | ICD-10-CM | POA: Insufficient documentation

## 2023-04-02 ENCOUNTER — Telehealth: Payer: Self-pay | Admitting: Plastic Surgery

## 2023-04-02 NOTE — Telephone Encounter (Signed)
Marie Nguyen saw you 02/08/23 and had the U/S you wanted done 03/11/23.  She would like to know what she needs to do next.  She can be reached at [phone]

## 2023-04-26 ENCOUNTER — Encounter: Payer: Self-pay | Admitting: Plastic Surgery

## 2023-04-26 ENCOUNTER — Ambulatory Visit: Payer: BC Managed Care – PPO | Admitting: Plastic Surgery

## 2023-04-26 DIAGNOSIS — R2242 Localized swelling, mass and lump, left lower limb: Secondary | ICD-10-CM

## 2023-04-26 NOTE — Progress Notes (Signed)
   Subjective:    Patient ID: ELLISYN ICENHOWER, female    DOB: [birth year], 67 y.o.   MRN: [id number]  HPI The patient is a 67 year old female joining me by phone for review of ultrasound results.  She had some concerns about a mass on the medial aspect of her left thigh.  An ultrasound was done and showed an ill-defined hyperechoic lesion in the area of concern likely a lipoma.  Patient feels comfortable with moving ahead with excision.  I think this is reasonable.  And we will make arrangements for excision.   Review of Systems  Constitutional: Negative.   HENT: Negative.    Eyes: Negative.   Respiratory: Negative.    Cardiovascular: Negative.   Gastrointestinal: Negative.   Endocrine: Negative.   Genitourinary: Negative.   Musculoskeletal: Negative.        Objective:   Physical Exam     Assessment & Plan:  Mass of left thigh   Plan for excision of left thigh medial lipoma/mass.  I connected with  NICOLENA SCHURMAN on 04/26/23 by phone and verified that I am speaking with the correct person using two identifiers. The patient was at home and I was at the office.  We spent 5 min in discussion.  I placed a request for surgical excision of left medial thigh mass.   I discussed the limitations of evaluation and management by telemedicine. The patient expressed understanding and agreed to proceed.

## 2023-05-23 ENCOUNTER — Inpatient Hospital Stay: Payer: BC Managed Care – PPO | Attending: Oncology | Admitting: Oncology

## 2023-05-23 ENCOUNTER — Ambulatory Visit: Payer: BC Managed Care – PPO | Admitting: Oncology

## 2023-06-11 ENCOUNTER — Encounter: Payer: Self-pay | Admitting: Family Medicine

## 2023-06-11 ENCOUNTER — Ambulatory Visit: Payer: BC Managed Care – PPO | Admitting: Family Medicine

## 2023-06-11 VITALS — BP 126/82 | HR 74 | Ht 67.0 in | Wt 140.0 lb

## 2023-06-11 DIAGNOSIS — Z23 Encounter for immunization: Secondary | ICD-10-CM

## 2023-06-11 DIAGNOSIS — I10 Essential (primary) hypertension: Secondary | ICD-10-CM | POA: Diagnosis not present

## 2023-06-11 MED ORDER — LISINOPRIL-HYDROCHLOROTHIAZIDE 10-12.5 MG PO TABS: 1.0000 | ORAL_TABLET | Freq: Every day | ORAL | 1 refills | Status: DC

## 2023-06-11 NOTE — Progress Notes (Signed)
Date:  06/11/2023   Name:  Marie Nguyen   DOB:  26-Jul-1956   MRN:  [id number]   Chief Complaint: Hypertension  Hypertension This is a chronic problem. The problem is controlled. Associated symptoms include headaches. Pertinent negatives include no anxiety, blurred vision, chest pain, malaise/fatigue, neck pain, orthopnea, palpitations, peripheral edema, PND, shortness of breath or sweats. ("Stress") There are no associated agents to hypertension. Past treatments include ACE inhibitors and diuretics. The current treatment provides moderate improvement. There are no compliance problems.  There is no history of CAD/MI or CVA. There is no history of chronic renal disease, a hypertension causing med or renovascular disease.    Lab Results  Component Value Date   NA 139 11/12/2022   K 4.4 11/12/2022   CO2 25 11/12/2022   GLUCOSE 92 11/12/2022   BUN 11 11/12/2022   CREATININE 0.72 11/12/2022   CALCIUM 9.4 11/12/2022   EGFR 97 12/22/2021   GFRNONAA >60 11/12/2022   Lab Results  Component Value Date   CHOL 208 (H) 12/04/2022   HDL 71 12/04/2022   LDLCALC 124 (H) 12/04/2022   TRIG 75 12/04/2022   Lab Results  Component Value Date   TSH 2.810 11/19/2018   No results found for: "HGBA1C" Lab Results  Component Value Date   WBC 4.7 12/04/2022   HGB 13.5 12/04/2022   HCT 41.2 12/04/2022   MCV 96 12/04/2022   PLT 269 12/04/2022   Lab Results  Component Value Date   ALT 16 03/10/2021   AST 18 03/10/2021   ALKPHOS 58 03/10/2021   BILITOT 1.3 (H) 03/10/2021   No results found for: "25OHVITD2", "25OHVITD3", "VD25OH"   Review of Systems  Constitutional:  Negative for fatigue, malaise/fatigue and unexpected weight change.  HENT:  Negative for trouble swallowing.   Eyes:  Negative for blurred vision and visual disturbance.  Respiratory:  Negative for cough, choking, chest tightness, shortness of breath and wheezing.   Cardiovascular:  Negative for chest pain, palpitations,  orthopnea, leg swelling and PND.  Gastrointestinal:  Negative for abdominal pain and blood in stool.  Endocrine: Negative for polydipsia and polyuria.  Genitourinary:  Negative for hematuria and vaginal bleeding.  Musculoskeletal:  Negative for neck pain.  Neurological:  Positive for headaches.    Patient Active Problem List   Diagnosis Date Noted   Acquired absence of breast and absent nipple, bilateral 02/08/2023   Mass of thigh 02/08/2023   Genetic testing 10/23/2022   Bilateral breast cancer (HCC) 07/26/2022   H/O total hysterectomy 12/22/2021   Lumbar spondylosis 09/20/2021   Right hip pain 09/20/2021   Skin nodule 06/30/2013    Allergies  Allergen Reactions   Latex Rash    Past Surgical History:  Procedure Laterality Date   ABDOMINAL HYSTERECTOMY     AUGMENTATION MAMMAPLASTY     AXILLARY SENTINEL NODE BIOPSY Bilateral 07/26/2022   Procedure: AXILLARY SENTINEL NODE BIOPSY;  Surgeon: Henrene Dodge, MD;  Location: [facility] ORS;  Service: General;  Laterality: Bilateral;   BREAST BIOPSY Right    neg   BREAST BIOPSY Right 05/16/2022   x 2 areas/ [place] bx & Stereo bx coil clip/INC and DCIS   BREAST BIOPSY Left 05/16/2022   [place] bx/ [facility] with DCIS   BREAST RECONSTRUCTION WITH PLACEMENT OF TISSUE EXPANDER AND ALLODERM Bilateral 07/26/2022   Procedure: BREAST RECONSTRUCTION WITH PLACEMENT OF TISSUE EXPANDER AND ALLODERM;  Surgeon: Peggye Form, DO;  Location: [facility];  Service: Plastics;  Laterality: Bilateral;  CESAREAN SECTION     COLONOSCOPY WITH PROPOFOL N/A 11/24/2018   Procedure: COLONOSCOPY WITH PROPOFOL;  Surgeon: Wyline Mood, MD;  Location: [facility] ENDOSCOPY;  Service: Gastroenterology;  Laterality: N/A;   COLONOSCOPY WITH PROPOFOL N/A 07/03/2021   Procedure: COLONOSCOPY WITH PROPOFOL;  Surgeon: Wyline Mood, MD;  Location: [facility] ENDOSCOPY;  Service: Gastroenterology;  Laterality: N/A;   GANGLION CYST EXCISION Right    wrist   MASTECTOMY Bilateral    REMOVAL OF TISSUE  EXPANDER AND PLACEMENT OF IMPLANT Bilateral 11/14/2022   Procedure: REMOVAL OF TISSUE EXPANDER AND PLACEMENT OF IMPLANT;  Surgeon: Peggye Form, DO;  Location: [facility];  Service: Plastics;  Laterality: Bilateral;   SIMPLE MASTECTOMY WITH AXILLARY SENTINEL NODE BIOPSY Bilateral 07/26/2022   Procedure: SIMPLE MASTECTOMY, Laqueta Due, PA-C to assist;  Surgeon: Henrene Dodge, MD;  Location: [facility] OR[facility];  Service: General;  Laterality: Bilateral;   TOTAL ABDOMINAL HYSTERECTOMY      Social History   Tobacco Use   Smoking status: Never    Passive exposure: Never   Smokeless tobacco: Never  Vaping Use   Vaping status: Never Used  Substance Use Topics   Alcohol use: Yes    Comment: rarely   Drug use: Never     Medication list has been reviewed and updated.  No outpatient medications have been marked as taking for the 06/11/23 encounter (Office Visit) with Duanne Limerick, MD.       06/11/2023    2:51 PM 12/04/2022   10:05 AM 06/01/2022    9:04 AM 03/13/2022    8:50 AM  GAD 7 : Generalized Anxiety Score  Nervous, Anxious, on Edge 0 0 0 0  Control/stop worrying 0 0 0 0  Worry too much - different things 0 0 0 0  Trouble relaxing 0 0 0 0  Restless 0 0 0 0  Easily annoyed or irritable 0 0 0 0  Afraid - awful might happen 0 0 0 0  Total GAD 7 Score 0 0 0 0  Anxiety Difficulty Not difficult at all Not difficult at all Not difficult at all Not difficult at all       06/11/2023    2:51 PM 12/04/2022   10:05 AM 06/01/2022    9:04 AM  Depression screen PHQ 2/9  Decreased Interest 0 0 0  Down, Depressed, Hopeless 0 0 0  PHQ - 2 Score 0 0 0  Altered sleeping 0 0 0  Tired, decreased energy 0 0 0  Change in appetite 0 0 0  Feeling bad or failure about yourself  0 0 0  Trouble concentrating 0 0 0  Moving slowly or fidgety/restless 0 0 0  Suicidal thoughts 0 0 0  PHQ-9 Score 0 0 0  Difficult doing work/chores Not difficult at all Not difficult at all Not difficult at  all    BP Readings from Last 3 Encounters:  06/11/23 138/78  02/08/23 (!) 158/96  12/19/22 (!) 137/91    Physical Exam Vitals and nursing note reviewed. Exam conducted with a chaperone present.  Constitutional:      General: She is not in acute distress.    Appearance: She is not diaphoretic.  HENT:     Head: Normocephalic and atraumatic.     Right Ear: Tympanic membrane and external ear normal.     Left Ear: Tympanic membrane and external ear normal.     Nose: Nose normal. No congestion.  Eyes:     General:  Right eye: No discharge.        Left eye: No discharge.     Conjunctiva/sclera: Conjunctivae normal.     Pupils: Pupils are equal, round, and reactive to light.  Neck:     Thyroid: No thyromegaly.     Vascular: No JVD.  Cardiovascular:     Rate and Rhythm: Normal rate and regular rhythm.     Heart sounds: Normal heart sounds, S1 normal and S2 normal. No murmur heard.    No systolic murmur is present.     No diastolic murmur is present.     No friction rub. No gallop.  Pulmonary:     Effort: Pulmonary effort is normal.     Breath sounds: Normal breath sounds.  Abdominal:     General: Bowel sounds are normal.     Palpations: Abdomen is soft. There is no hepatomegaly or splenomegaly.  Musculoskeletal:     Cervical back: Normal range of motion and neck supple.  Lymphadenopathy:     Cervical: No cervical adenopathy.  Skin:    General: Skin is warm and dry.  Neurological:     Mental Status: She is alert.     Deep Tendon Reflexes: Reflexes are normal and symmetric.     Wt Readings from Last 3 Encounters:  06/11/23 140 lb (63.5 kg)  02/08/23 139 lb 6.4 oz (63.2 kg)  12/19/22 133 lb (60.3 kg)    BP 138/78   Pulse 74   Ht 5\' 7"  (1.702 m)   Wt 140 lb (63.5 kg)   SpO2 98%   BMI 21.93 kg/m   Assessment and Plan:  1. Primary hypertension Chronic.  Controlled.  Stable.  Blood pressure 126/82.  Asymptomatic.  Tolerating medication well.  Continue  lisinopril hydrochlorothiazide 10-12.5 mg once a day.  Will recheck in 6 months. - lisinopril-hydrochlorothiazide (ZESTORETIC) 10-12.5 MG tablet; Take 1 tablet by mouth daily.  Dispense: 90 tablet; Refill: 1  2. Need for influenza vaccination Discussed and administered - Flu vaccine trivalent PF, 6mos and older(Flulaval,Afluria,Fluarix,Fluzone)    Elizabeth Sauer, MD

## 2023-06-19 DIAGNOSIS — L814 Other melanin hyperpigmentation: Secondary | ICD-10-CM | POA: Diagnosis not present

## 2023-06-19 DIAGNOSIS — L820 Inflamed seborrheic keratosis: Secondary | ICD-10-CM | POA: Diagnosis not present

## 2023-06-19 DIAGNOSIS — L538 Other specified erythematous conditions: Secondary | ICD-10-CM | POA: Diagnosis not present

## 2023-06-19 DIAGNOSIS — X32XXXA Exposure to sunlight, initial encounter: Secondary | ICD-10-CM | POA: Diagnosis not present

## 2023-06-19 DIAGNOSIS — L578 Other skin changes due to chronic exposure to nonionizing radiation: Secondary | ICD-10-CM | POA: Diagnosis not present

## 2023-07-10 ENCOUNTER — Ambulatory Visit: Payer: BC Managed Care – PPO | Admitting: Physician Assistant

## 2023-07-10 VITALS — BP 161/90 | HR 62

## 2023-07-10 DIAGNOSIS — R2242 Localized swelling, mass and lump, left lower limb: Secondary | ICD-10-CM

## 2023-07-10 MED ORDER — OXYCODONE HCL 5 MG PO TABS: 5.0000 mg | ORAL_TABLET | Freq: Three times a day (TID) | ORAL | 0 refills | Status: AC | PRN

## 2023-07-10 MED ORDER — CEPHALEXIN 500 MG PO CAPS: 500.0000 mg | ORAL_CAPSULE | Freq: Four times a day (QID) | ORAL | 0 refills | Status: AC

## 2023-07-10 NOTE — Progress Notes (Signed)
Patient ID: Marie Nguyen, female    DOB: August 22, 1956, 67 y.o.   MRN: [id number]  Chief Complaint  Patient presents with   Pre-op Exam      ICD-10-CM   1. Mass of left thigh  R22.42        History of Present Illness: Marie Nguyen is a 67 y.o.  female  with a history of left thigh mass.  She presents for preoperative evaluation for upcoming procedure, excision of left thigh mass, scheduled for 07/31/2023 with Dr. Ulice Bold.  The patient has not had problems with anesthesia.  She denies any personal or family history of blood clots or clotting disorder.  She has never required anticoagulation.  Denies any history of CVA/MI.  No smoking or use nicotine-containing products.  She is on Femara, but can continue it perioperatively.  No need to hold any of her medications aside from the vitamins/supplements which I recommend holding for 5 days prior to surgery.  Summary of Previous Visit: She was seen by Dr. Ulice Bold on 04/26/2023 to review ultrasound results of her left thigh mass.  Results show an ill-defined hypoechoic lesion likely reflective of lipoma.  Patient expressed that she like to move forward with surgical excision.  Job: Office manager, computer-based position.  Denies need for FMLA/STD.  PMH Significant for: Breast cancer s/p bilateral mastectomy and reconstruction, left thigh mass, GERD, HTN.   Past Medical History: Allergies: Allergies  Allergen Reactions   Latex Rash    Current Medications:  Current Outpatient Medications:    acetaminophen (TYLENOL) 500 MG tablet, Take 1,000 mg by mouth every 6 (six) hours as needed for mild pain., Disp: , Rfl:    azelastine (ASTELIN) 0.1 % nasal spray, Place 1 spray into both nostrils daily. Use in each nostril as directed, Disp: , Rfl:    cephALEXin (KEFLEX) 500 MG capsule, Take 1 capsule (500 mg total) by mouth 4 (four) times daily for 3 days., Disp: 12 capsule, Rfl: 0   fluticasone (FLONASE) 50 MCG/ACT nasal spray,  Place 1 spray into both nostrils daily as needed for allergies or rhinitis., Disp: , Rfl:    ibuprofen (ADVIL) 200 MG tablet, Take 200 mg by mouth every 6 (six) hours as needed for mild pain. 2 - 3 tabs, Disp: , Rfl:    letrozole (FEMARA) 2.5 MG tablet, Take 1 tablet (2.5 mg total) by mouth daily., Disp: 90 tablet, Rfl: 3   lisinopril-hydrochlorothiazide (ZESTORETIC) 10-12.5 MG tablet, Take 1 tablet by mouth daily., Disp: 90 tablet, Rfl: 1   Multiple Vitamin (MULTIVITAMIN) tablet, Take 1 tablet by mouth daily., Disp: , Rfl:    Omega-3 Fatty Acids (FISH OIL) 1000 MG CAPS, Take 1 capsule by mouth as needed., Disp: , Rfl:    omeprazole (PRILOSEC) 20 MG capsule, Take 20 mg by mouth daily., Disp: , Rfl:    oxyCODONE (ROXICODONE) 5 MG immediate release tablet, Take 1 tablet (5 mg total) by mouth every 8 (eight) hours as needed for up to 5 days for severe pain (pain score 7-10)., Disp: 6 tablet, Rfl: 0  Past Medical Problems: Past Medical History:  Diagnosis Date   Allergy    Arthritis    Eczema    GERD (gastroesophageal reflux disease)    Heart murmur    Hypertension     Past Surgical History: Past Surgical History:  Procedure Laterality Date   ABDOMINAL HYSTERECTOMY     AUGMENTATION MAMMAPLASTY     AXILLARY SENTINEL NODE BIOPSY Bilateral 07/26/2022  Patient ID: Marie Nguyen, female    DOB: August 22, 1956, 67 y.o.   MRN: [id number]  Chief Complaint  Patient presents with   Pre-op Exam      ICD-10-CM   1. Mass of left thigh  R22.42        History of Present Illness: Marie Nguyen is a 67 y.o.  female  with a history of left thigh mass.  She presents for preoperative evaluation for upcoming procedure, excision of left thigh mass, scheduled for 07/31/2023 with Dr. Ulice Bold.  The patient has not had problems with anesthesia.  She denies any personal or family history of blood clots or clotting disorder.  She has never required anticoagulation.  Denies any history of CVA/MI.  No smoking or use nicotine-containing products.  She is on Femara, but can continue it perioperatively.  No need to hold any of her medications aside from the vitamins/supplements which I recommend holding for 5 days prior to surgery.  Summary of Previous Visit: She was seen by Dr. Ulice Bold on 04/26/2023 to review ultrasound results of her left thigh mass.  Results show an ill-defined hypoechoic lesion likely reflective of lipoma.  Patient expressed that she like to move forward with surgical excision.  Job: Office manager, computer-based position.  Denies need for FMLA/STD.  PMH Significant for: Breast cancer s/p bilateral mastectomy and reconstruction, left thigh mass, GERD, HTN.   Past Medical History: Allergies: Allergies  Allergen Reactions   Latex Rash    Current Medications:  Current Outpatient Medications:    acetaminophen (TYLENOL) 500 MG tablet, Take 1,000 mg by mouth every 6 (six) hours as needed for mild pain., Disp: , Rfl:    azelastine (ASTELIN) 0.1 % nasal spray, Place 1 spray into both nostrils daily. Use in each nostril as directed, Disp: , Rfl:    cephALEXin (KEFLEX) 500 MG capsule, Take 1 capsule (500 mg total) by mouth 4 (four) times daily for 3 days., Disp: 12 capsule, Rfl: 0   fluticasone (FLONASE) 50 MCG/ACT nasal spray,  Place 1 spray into both nostrils daily as needed for allergies or rhinitis., Disp: , Rfl:    ibuprofen (ADVIL) 200 MG tablet, Take 200 mg by mouth every 6 (six) hours as needed for mild pain. 2 - 3 tabs, Disp: , Rfl:    letrozole (FEMARA) 2.5 MG tablet, Take 1 tablet (2.5 mg total) by mouth daily., Disp: 90 tablet, Rfl: 3   lisinopril-hydrochlorothiazide (ZESTORETIC) 10-12.5 MG tablet, Take 1 tablet by mouth daily., Disp: 90 tablet, Rfl: 1   Multiple Vitamin (MULTIVITAMIN) tablet, Take 1 tablet by mouth daily., Disp: , Rfl:    Omega-3 Fatty Acids (FISH OIL) 1000 MG CAPS, Take 1 capsule by mouth as needed., Disp: , Rfl:    omeprazole (PRILOSEC) 20 MG capsule, Take 20 mg by mouth daily., Disp: , Rfl:    oxyCODONE (ROXICODONE) 5 MG immediate release tablet, Take 1 tablet (5 mg total) by mouth every 8 (eight) hours as needed for up to 5 days for severe pain (pain score 7-10)., Disp: 6 tablet, Rfl: 0  Past Medical Problems: Past Medical History:  Diagnosis Date   Allergy    Arthritis    Eczema    GERD (gastroesophageal reflux disease)    Heart murmur    Hypertension     Past Surgical History: Past Surgical History:  Procedure Laterality Date   ABDOMINAL HYSTERECTOMY     AUGMENTATION MAMMAPLASTY     AXILLARY SENTINEL NODE BIOPSY Bilateral 07/26/2022  Patient ID: Marie Nguyen, female    DOB: August 22, 1956, 67 y.o.   MRN: [id number]  Chief Complaint  Patient presents with   Pre-op Exam      ICD-10-CM   1. Mass of left thigh  R22.42        History of Present Illness: Marie Nguyen is a 67 y.o.  female  with a history of left thigh mass.  She presents for preoperative evaluation for upcoming procedure, excision of left thigh mass, scheduled for 07/31/2023 with Dr. Ulice Bold.  The patient has not had problems with anesthesia.  She denies any personal or family history of blood clots or clotting disorder.  She has never required anticoagulation.  Denies any history of CVA/MI.  No smoking or use nicotine-containing products.  She is on Femara, but can continue it perioperatively.  No need to hold any of her medications aside from the vitamins/supplements which I recommend holding for 5 days prior to surgery.  Summary of Previous Visit: She was seen by Dr. Ulice Bold on 04/26/2023 to review ultrasound results of her left thigh mass.  Results show an ill-defined hypoechoic lesion likely reflective of lipoma.  Patient expressed that she like to move forward with surgical excision.  Job: Office manager, computer-based position.  Denies need for FMLA/STD.  PMH Significant for: Breast cancer s/p bilateral mastectomy and reconstruction, left thigh mass, GERD, HTN.   Past Medical History: Allergies: Allergies  Allergen Reactions   Latex Rash    Current Medications:  Current Outpatient Medications:    acetaminophen (TYLENOL) 500 MG tablet, Take 1,000 mg by mouth every 6 (six) hours as needed for mild pain., Disp: , Rfl:    azelastine (ASTELIN) 0.1 % nasal spray, Place 1 spray into both nostrils daily. Use in each nostril as directed, Disp: , Rfl:    cephALEXin (KEFLEX) 500 MG capsule, Take 1 capsule (500 mg total) by mouth 4 (four) times daily for 3 days., Disp: 12 capsule, Rfl: 0   fluticasone (FLONASE) 50 MCG/ACT nasal spray,  Place 1 spray into both nostrils daily as needed for allergies or rhinitis., Disp: , Rfl:    ibuprofen (ADVIL) 200 MG tablet, Take 200 mg by mouth every 6 (six) hours as needed for mild pain. 2 - 3 tabs, Disp: , Rfl:    letrozole (FEMARA) 2.5 MG tablet, Take 1 tablet (2.5 mg total) by mouth daily., Disp: 90 tablet, Rfl: 3   lisinopril-hydrochlorothiazide (ZESTORETIC) 10-12.5 MG tablet, Take 1 tablet by mouth daily., Disp: 90 tablet, Rfl: 1   Multiple Vitamin (MULTIVITAMIN) tablet, Take 1 tablet by mouth daily., Disp: , Rfl:    Omega-3 Fatty Acids (FISH OIL) 1000 MG CAPS, Take 1 capsule by mouth as needed., Disp: , Rfl:    omeprazole (PRILOSEC) 20 MG capsule, Take 20 mg by mouth daily., Disp: , Rfl:    oxyCODONE (ROXICODONE) 5 MG immediate release tablet, Take 1 tablet (5 mg total) by mouth every 8 (eight) hours as needed for up to 5 days for severe pain (pain score 7-10)., Disp: 6 tablet, Rfl: 0  Past Medical Problems: Past Medical History:  Diagnosis Date   Allergy    Arthritis    Eczema    GERD (gastroesophageal reflux disease)    Heart murmur    Hypertension     Past Surgical History: Past Surgical History:  Procedure Laterality Date   ABDOMINAL HYSTERECTOMY     AUGMENTATION MAMMAPLASTY     AXILLARY SENTINEL NODE BIOPSY Bilateral 07/26/2022

## 2023-07-10 NOTE — H&P (View-Only) (Signed)
Patient ID: Marie Nguyen, female    DOB: August 22, 1956, 67 y.o.   MRN: [id number]  Chief Complaint  Patient presents with   Pre-op Exam      ICD-10-CM   1. Mass of left thigh  R22.42        History of Present Illness: Marie Nguyen is a 67 y.o.  female  with a history of left thigh mass.  She presents for preoperative evaluation for upcoming procedure, excision of left thigh mass, scheduled for 07/31/2023 with Dr. Ulice Bold.  The patient has not had problems with anesthesia.  She denies any personal or family history of blood clots or clotting disorder.  She has never required anticoagulation.  Denies any history of CVA/MI.  No smoking or use nicotine-containing products.  She is on Femara, but can continue it perioperatively.  No need to hold any of her medications aside from the vitamins/supplements which I recommend holding for 5 days prior to surgery.  Summary of Previous Visit: She was seen by Dr. Ulice Bold on 04/26/2023 to review ultrasound results of her left thigh mass.  Results show an ill-defined hypoechoic lesion likely reflective of lipoma.  Patient expressed that she like to move forward with surgical excision.  Job: Office manager, computer-based position.  Denies need for FMLA/STD.  PMH Significant for: Breast cancer s/p bilateral mastectomy and reconstruction, left thigh mass, GERD, HTN.   Past Medical History: Allergies: Allergies  Allergen Reactions   Latex Rash    Current Medications:  Current Outpatient Medications:    acetaminophen (TYLENOL) 500 MG tablet, Take 1,000 mg by mouth every 6 (six) hours as needed for mild pain., Disp: , Rfl:    azelastine (ASTELIN) 0.1 % nasal spray, Place 1 spray into both nostrils daily. Use in each nostril as directed, Disp: , Rfl:    cephALEXin (KEFLEX) 500 MG capsule, Take 1 capsule (500 mg total) by mouth 4 (four) times daily for 3 days., Disp: 12 capsule, Rfl: 0   fluticasone (FLONASE) 50 MCG/ACT nasal spray,  Place 1 spray into both nostrils daily as needed for allergies or rhinitis., Disp: , Rfl:    ibuprofen (ADVIL) 200 MG tablet, Take 200 mg by mouth every 6 (six) hours as needed for mild pain. 2 - 3 tabs, Disp: , Rfl:    letrozole (FEMARA) 2.5 MG tablet, Take 1 tablet (2.5 mg total) by mouth daily., Disp: 90 tablet, Rfl: 3   lisinopril-hydrochlorothiazide (ZESTORETIC) 10-12.5 MG tablet, Take 1 tablet by mouth daily., Disp: 90 tablet, Rfl: 1   Multiple Vitamin (MULTIVITAMIN) tablet, Take 1 tablet by mouth daily., Disp: , Rfl:    Omega-3 Fatty Acids (FISH OIL) 1000 MG CAPS, Take 1 capsule by mouth as needed., Disp: , Rfl:    omeprazole (PRILOSEC) 20 MG capsule, Take 20 mg by mouth daily., Disp: , Rfl:    oxyCODONE (ROXICODONE) 5 MG immediate release tablet, Take 1 tablet (5 mg total) by mouth every 8 (eight) hours as needed for up to 5 days for severe pain (pain score 7-10)., Disp: 6 tablet, Rfl: 0  Past Medical Problems: Past Medical History:  Diagnosis Date   Allergy    Arthritis    Eczema    GERD (gastroesophageal reflux disease)    Heart murmur    Hypertension     Past Surgical History: Past Surgical History:  Procedure Laterality Date   ABDOMINAL HYSTERECTOMY     AUGMENTATION MAMMAPLASTY     AXILLARY SENTINEL NODE BIOPSY Bilateral 07/26/2022  Patient ID: Marie Nguyen, female    DOB: August 22, 1956, 67 y.o.   MRN: [id number]  Chief Complaint  Patient presents with   Pre-op Exam      ICD-10-CM   1. Mass of left thigh  R22.42        History of Present Illness: Marie Nguyen is a 67 y.o.  female  with a history of left thigh mass.  She presents for preoperative evaluation for upcoming procedure, excision of left thigh mass, scheduled for 07/31/2023 with Dr. Ulice Bold.  The patient has not had problems with anesthesia.  She denies any personal or family history of blood clots or clotting disorder.  She has never required anticoagulation.  Denies any history of CVA/MI.  No smoking or use nicotine-containing products.  She is on Femara, but can continue it perioperatively.  No need to hold any of her medications aside from the vitamins/supplements which I recommend holding for 5 days prior to surgery.  Summary of Previous Visit: She was seen by Dr. Ulice Bold on 04/26/2023 to review ultrasound results of her left thigh mass.  Results show an ill-defined hypoechoic lesion likely reflective of lipoma.  Patient expressed that she like to move forward with surgical excision.  Job: Office manager, computer-based position.  Denies need for FMLA/STD.  PMH Significant for: Breast cancer s/p bilateral mastectomy and reconstruction, left thigh mass, GERD, HTN.   Past Medical History: Allergies: Allergies  Allergen Reactions   Latex Rash    Current Medications:  Current Outpatient Medications:    acetaminophen (TYLENOL) 500 MG tablet, Take 1,000 mg by mouth every 6 (six) hours as needed for mild pain., Disp: , Rfl:    azelastine (ASTELIN) 0.1 % nasal spray, Place 1 spray into both nostrils daily. Use in each nostril as directed, Disp: , Rfl:    cephALEXin (KEFLEX) 500 MG capsule, Take 1 capsule (500 mg total) by mouth 4 (four) times daily for 3 days., Disp: 12 capsule, Rfl: 0   fluticasone (FLONASE) 50 MCG/ACT nasal spray,  Place 1 spray into both nostrils daily as needed for allergies or rhinitis., Disp: , Rfl:    ibuprofen (ADVIL) 200 MG tablet, Take 200 mg by mouth every 6 (six) hours as needed for mild pain. 2 - 3 tabs, Disp: , Rfl:    letrozole (FEMARA) 2.5 MG tablet, Take 1 tablet (2.5 mg total) by mouth daily., Disp: 90 tablet, Rfl: 3   lisinopril-hydrochlorothiazide (ZESTORETIC) 10-12.5 MG tablet, Take 1 tablet by mouth daily., Disp: 90 tablet, Rfl: 1   Multiple Vitamin (MULTIVITAMIN) tablet, Take 1 tablet by mouth daily., Disp: , Rfl:    Omega-3 Fatty Acids (FISH OIL) 1000 MG CAPS, Take 1 capsule by mouth as needed., Disp: , Rfl:    omeprazole (PRILOSEC) 20 MG capsule, Take 20 mg by mouth daily., Disp: , Rfl:    oxyCODONE (ROXICODONE) 5 MG immediate release tablet, Take 1 tablet (5 mg total) by mouth every 8 (eight) hours as needed for up to 5 days for severe pain (pain score 7-10)., Disp: 6 tablet, Rfl: 0  Past Medical Problems: Past Medical History:  Diagnosis Date   Allergy    Arthritis    Eczema    GERD (gastroesophageal reflux disease)    Heart murmur    Hypertension     Past Surgical History: Past Surgical History:  Procedure Laterality Date   ABDOMINAL HYSTERECTOMY     AUGMENTATION MAMMAPLASTY     AXILLARY SENTINEL NODE BIOPSY Bilateral 07/26/2022  Patient ID: Marie Nguyen, female    DOB: August 22, 1956, 67 y.o.   MRN: [id number]  Chief Complaint  Patient presents with   Pre-op Exam      ICD-10-CM   1. Mass of left thigh  R22.42        History of Present Illness: Marie Nguyen is a 67 y.o.  female  with a history of left thigh mass.  She presents for preoperative evaluation for upcoming procedure, excision of left thigh mass, scheduled for 07/31/2023 with Dr. Ulice Bold.  The patient has not had problems with anesthesia.  She denies any personal or family history of blood clots or clotting disorder.  She has never required anticoagulation.  Denies any history of CVA/MI.  No smoking or use nicotine-containing products.  She is on Femara, but can continue it perioperatively.  No need to hold any of her medications aside from the vitamins/supplements which I recommend holding for 5 days prior to surgery.  Summary of Previous Visit: She was seen by Dr. Ulice Bold on 04/26/2023 to review ultrasound results of her left thigh mass.  Results show an ill-defined hypoechoic lesion likely reflective of lipoma.  Patient expressed that she like to move forward with surgical excision.  Job: Office manager, computer-based position.  Denies need for FMLA/STD.  PMH Significant for: Breast cancer s/p bilateral mastectomy and reconstruction, left thigh mass, GERD, HTN.   Past Medical History: Allergies: Allergies  Allergen Reactions   Latex Rash    Current Medications:  Current Outpatient Medications:    acetaminophen (TYLENOL) 500 MG tablet, Take 1,000 mg by mouth every 6 (six) hours as needed for mild pain., Disp: , Rfl:    azelastine (ASTELIN) 0.1 % nasal spray, Place 1 spray into both nostrils daily. Use in each nostril as directed, Disp: , Rfl:    cephALEXin (KEFLEX) 500 MG capsule, Take 1 capsule (500 mg total) by mouth 4 (four) times daily for 3 days., Disp: 12 capsule, Rfl: 0   fluticasone (FLONASE) 50 MCG/ACT nasal spray,  Place 1 spray into both nostrils daily as needed for allergies or rhinitis., Disp: , Rfl:    ibuprofen (ADVIL) 200 MG tablet, Take 200 mg by mouth every 6 (six) hours as needed for mild pain. 2 - 3 tabs, Disp: , Rfl:    letrozole (FEMARA) 2.5 MG tablet, Take 1 tablet (2.5 mg total) by mouth daily., Disp: 90 tablet, Rfl: 3   lisinopril-hydrochlorothiazide (ZESTORETIC) 10-12.5 MG tablet, Take 1 tablet by mouth daily., Disp: 90 tablet, Rfl: 1   Multiple Vitamin (MULTIVITAMIN) tablet, Take 1 tablet by mouth daily., Disp: , Rfl:    Omega-3 Fatty Acids (FISH OIL) 1000 MG CAPS, Take 1 capsule by mouth as needed., Disp: , Rfl:    omeprazole (PRILOSEC) 20 MG capsule, Take 20 mg by mouth daily., Disp: , Rfl:    oxyCODONE (ROXICODONE) 5 MG immediate release tablet, Take 1 tablet (5 mg total) by mouth every 8 (eight) hours as needed for up to 5 days for severe pain (pain score 7-10)., Disp: 6 tablet, Rfl: 0  Past Medical Problems: Past Medical History:  Diagnosis Date   Allergy    Arthritis    Eczema    GERD (gastroesophageal reflux disease)    Heart murmur    Hypertension     Past Surgical History: Past Surgical History:  Procedure Laterality Date   ABDOMINAL HYSTERECTOMY     AUGMENTATION MAMMAPLASTY     AXILLARY SENTINEL NODE BIOPSY Bilateral 07/26/2022

## 2023-07-23 ENCOUNTER — Telehealth: Payer: Self-pay | Admitting: *Deleted

## 2023-07-23 ENCOUNTER — Telehealth: Payer: Self-pay | Admitting: Plastic Surgery

## 2023-07-23 NOTE — Telephone Encounter (Signed)
Patient called with information that surgery was moved to earlier in the day and she needs to be at the surgery center at 6:30 am.

## 2023-07-23 NOTE — Telephone Encounter (Signed)
LVM to let patient know of new OR / Arrival time 0830 / 0630

## 2023-07-25 ENCOUNTER — Encounter (HOSPITAL_BASED_OUTPATIENT_CLINIC_OR_DEPARTMENT_OTHER): Payer: Self-pay | Admitting: Plastic Surgery

## 2023-07-25 ENCOUNTER — Other Ambulatory Visit: Payer: Self-pay

## 2023-07-26 ENCOUNTER — Encounter (HOSPITAL_BASED_OUTPATIENT_CLINIC_OR_DEPARTMENT_OTHER)
Admission: RE | Admit: 2023-07-26 | Discharge: 2023-07-26 | Disposition: A | Discharge status: Home / Self Care | Payer: BC Managed Care – PPO | Source: Ambulatory Visit | Attending: Plastic Surgery | Admitting: Plastic Surgery

## 2023-07-26 DIAGNOSIS — Z01812 Encounter for preprocedural laboratory examination: Secondary | ICD-10-CM | POA: Insufficient documentation

## 2023-07-26 LAB — BASIC METABOLIC PANEL
Anion gap: 8 (ref 5–15)
BUN: 13 mg/dL (ref 8–23)
CO2: 28 mmol/L (ref 22–32)
Calcium: 9.4 mg/dL (ref 8.9–10.3)
Chloride: 103 mmol/L (ref 98–111)
Creatinine, Ser: 0.67 mg/dL (ref 0.44–1.00)
GFR, Estimated: >60 mL/min (ref >60)
Glucose, Bld: 97 mg/dL (ref 70–99)
Potassium: 3.7 mmol/L (ref 3.5–5.1)
Sodium: 139 mmol/L (ref 135–145)

## 2023-07-30 ENCOUNTER — Telehealth: Payer: Self-pay

## 2023-07-30 NOTE — Telephone Encounter (Signed)
Patient left message stating she missed virtual visit with Dr Orlie Dakin and needs to r/s. Call back # [phone]

## 2023-07-31 ENCOUNTER — Encounter (HOSPITAL_BASED_OUTPATIENT_CLINIC_OR_DEPARTMENT_OTHER)
Admission: RE | Disposition: A | Discharge status: Home / Self Care | Payer: Self-pay | Source: Home / Self Care | Attending: Plastic Surgery

## 2023-07-31 ENCOUNTER — Other Ambulatory Visit: Payer: Self-pay

## 2023-07-31 ENCOUNTER — Ambulatory Visit (HOSPITAL_BASED_OUTPATIENT_CLINIC_OR_DEPARTMENT_OTHER): Payer: BC Managed Care – PPO | Admitting: Anesthesiology

## 2023-07-31 ENCOUNTER — Encounter (HOSPITAL_BASED_OUTPATIENT_CLINIC_OR_DEPARTMENT_OTHER): Payer: Self-pay | Admitting: Plastic Surgery

## 2023-07-31 ENCOUNTER — Ambulatory Visit (HOSPITAL_BASED_OUTPATIENT_CLINIC_OR_DEPARTMENT_OTHER)
Admission: RE | Admit: 2023-07-31 | Discharge: 2023-07-31 | Disposition: A | Discharge status: Home / Self Care | Payer: BC Managed Care – PPO | Attending: Plastic Surgery | Admitting: Plastic Surgery

## 2023-07-31 DIAGNOSIS — Z853 Personal history of malignant neoplasm of breast: Secondary | ICD-10-CM | POA: Insufficient documentation

## 2023-07-31 DIAGNOSIS — I10 Essential (primary) hypertension: Secondary | ICD-10-CM | POA: Insufficient documentation

## 2023-07-31 DIAGNOSIS — Z79899 Other long term (current) drug therapy: Secondary | ICD-10-CM | POA: Diagnosis not present

## 2023-07-31 DIAGNOSIS — D1724 Benign lipomatous neoplasm of skin and subcutaneous tissue of left leg: Secondary | ICD-10-CM | POA: Insufficient documentation

## 2023-07-31 DIAGNOSIS — R2242 Localized swelling, mass and lump, left lower limb: Secondary | ICD-10-CM | POA: Diagnosis not present

## 2023-07-31 DIAGNOSIS — Z9013 Acquired absence of bilateral breasts and nipples: Secondary | ICD-10-CM | POA: Diagnosis not present

## 2023-07-31 DIAGNOSIS — Z01818 Encounter for other preprocedural examination: Secondary | ICD-10-CM

## 2023-07-31 DIAGNOSIS — Z79811 Long term (current) use of aromatase inhibitors: Secondary | ICD-10-CM | POA: Insufficient documentation

## 2023-07-31 DIAGNOSIS — K219 Gastro-esophageal reflux disease without esophagitis: Secondary | ICD-10-CM | POA: Diagnosis not present

## 2023-07-31 HISTORY — PX: LIPOMA EXCISION: SHX5283

## 2023-07-31 SURGERY — EXCISION LIPOMA
Anesthesia: General | Site: Thigh | Laterality: Left

## 2023-07-31 MED ORDER — DROPERIDOL 2.5 MG/ML IJ SOLN: 0.6250 mg | Freq: Once | INTRAMUSCULAR | Status: DC | PRN

## 2023-07-31 MED ORDER — CHLORHEXIDINE GLUCONATE CLOTH 2 % EX PADS
6.0000 | MEDICATED_PAD | Freq: Once | CUTANEOUS | Status: DC
Start: 2023-07-31 — End: 2023-07-31

## 2023-07-31 MED ORDER — FENTANYL CITRATE (PF) 100 MCG/2ML IJ SOLN
INTRAMUSCULAR | Status: DC | PRN
  Administered 2023-07-31: 100 ug via INTRAVENOUS

## 2023-07-31 MED ORDER — ACETAMINOPHEN 160 MG/5ML PO SOLN: 325.0000 mg | ORAL | Status: DC | PRN

## 2023-07-31 MED ORDER — LIDOCAINE HCL (CARDIAC) PF 100 MG/5ML IV SOSY: PREFILLED_SYRINGE | INTRAVENOUS | Status: DC | PRN

## 2023-07-31 MED ORDER — SUCCINYLCHOLINE CHLORIDE 200 MG/10ML IV SOSY
PREFILLED_SYRINGE | INTRAVENOUS | Status: AC
  Filled 2023-07-31: qty 10

## 2023-07-31 MED ORDER — ATROPINE SULFATE 0.4 MG/ML IV SOLN
INTRAVENOUS | Status: AC
  Filled 2023-07-31: qty 1

## 2023-07-31 MED ORDER — SUGAMMADEX SODIUM 200 MG/2ML IV SOLN
INTRAVENOUS | Status: DC | PRN
  Administered 2023-07-31: 200 mg via INTRAVENOUS

## 2023-07-31 MED ORDER — ROCURONIUM BROMIDE 10 MG/ML (PF) SYRINGE
PREFILLED_SYRINGE | INTRAVENOUS | Status: AC
  Filled 2023-07-31: qty 10

## 2023-07-31 MED ORDER — OXYCODONE HCL 5 MG PO TABS: 5.0000 mg | ORAL_TABLET | Freq: Once | ORAL | Status: DC | PRN

## 2023-07-31 MED ORDER — FENTANYL CITRATE (PF) 100 MCG/2ML IJ SOLN: 25.0000 ug | INTRAMUSCULAR | Status: DC | PRN

## 2023-07-31 MED ORDER — MIDAZOLAM HCL 5 MG/5ML IJ SOLN
INTRAMUSCULAR | Status: DC | PRN
  Administered 2023-07-31: 2 mg via INTRAVENOUS

## 2023-07-31 MED ORDER — LIDOCAINE-EPINEPHRINE 1 %-1:100000 IJ SOLN
INTRAMUSCULAR | Status: AC
  Filled 2023-07-31: qty 1

## 2023-07-31 MED ORDER — MIDAZOLAM HCL 2 MG/2ML IJ SOLN
INTRAMUSCULAR | Status: AC
  Filled 2023-07-31: qty 2

## 2023-07-31 MED ORDER — LIDOCAINE-EPINEPHRINE 1 %-1:100000 IJ SOLN
INTRAMUSCULAR | Status: DC | PRN
  Administered 2023-07-31: 9 mL

## 2023-07-31 MED ORDER — CEFAZOLIN SODIUM-DEXTROSE 2-4 GM/100ML-% IV SOLN
INTRAVENOUS | Status: AC
  Filled 2023-07-31: qty 100

## 2023-07-31 MED ORDER — LIDOCAINE 2% (20 MG/ML) 5 ML SYRINGE
INTRAMUSCULAR | Status: AC
  Filled 2023-07-31: qty 5

## 2023-07-31 MED ORDER — ACETAMINOPHEN 325 MG PO TABS: 325.0000 mg | ORAL_TABLET | ORAL | Status: DC | PRN

## 2023-07-31 MED ORDER — ACETAMINOPHEN 500 MG PO TABS: 1000.0000 mg | ORAL_TABLET | Freq: Once | ORAL | Status: DC

## 2023-07-31 MED ORDER — BUPIVACAINE HCL (PF) 0.25 % IJ SOLN
INTRAMUSCULAR | Status: AC
  Filled 2023-07-31: qty 30

## 2023-07-31 MED ORDER — EPINEPHRINE PF 1 MG/ML IJ SOLN
INTRAMUSCULAR | Status: AC
  Filled 2023-07-31: qty 1

## 2023-07-31 MED ORDER — EPHEDRINE 5 MG/ML INJ
INTRAVENOUS | Status: AC
  Filled 2023-07-31: qty 5

## 2023-07-31 MED ORDER — FENTANYL CITRATE (PF) 100 MCG/2ML IJ SOLN
INTRAMUSCULAR | Status: AC
  Filled 2023-07-31: qty 2

## 2023-07-31 MED ORDER — OXYCODONE HCL 5 MG/5ML PO SOLN: 5.0000 mg | Freq: Once | ORAL | Status: DC | PRN

## 2023-07-31 MED ORDER — SCOPOLAMINE 1 MG/3DAYS TD PT72: 1.0000 | MEDICATED_PATCH | TRANSDERMAL | Status: DC

## 2023-07-31 MED ORDER — LIDOCAINE HCL (CARDIAC) PF 100 MG/5ML IV SOSY
PREFILLED_SYRINGE | INTRAVENOUS | Status: DC | PRN
  Administered 2023-07-31: 40 mg via INTRAVENOUS

## 2023-07-31 MED ORDER — ROCURONIUM BROMIDE 100 MG/10ML IV SOLN
INTRAVENOUS | Status: DC | PRN
  Administered 2023-07-31: 50 mg via INTRAVENOUS

## 2023-07-31 MED ORDER — KETOROLAC TROMETHAMINE 30 MG/ML IJ SOLN
INTRAMUSCULAR | Status: AC
  Filled 2023-07-31: qty 1

## 2023-07-31 MED ORDER — ACETAMINOPHEN 10 MG/ML IV SOLN: 1000.0000 mg | Freq: Once | INTRAVENOUS | Status: DC | PRN

## 2023-07-31 MED ORDER — LACTATED RINGERS IV SOLN: INTRAVENOUS | Status: DC

## 2023-07-31 MED ORDER — PROPOFOL 10 MG/ML IV BOLUS
INTRAVENOUS | Status: DC | PRN
  Administered 2023-07-31: 150 mg via INTRAVENOUS

## 2023-07-31 MED ORDER — ONDANSETRON HCL 4 MG/2ML IJ SOLN
INTRAMUSCULAR | Status: AC
Start: 2023-07-31 — End: ?
  Filled 2023-07-31: qty 2

## 2023-07-31 MED ORDER — DEXAMETHASONE SODIUM PHOSPHATE 10 MG/ML IJ SOLN
INTRAMUSCULAR | Status: AC
  Filled 2023-07-31: qty 1

## 2023-07-31 MED ORDER — CEFAZOLIN SODIUM-DEXTROSE 2-4 GM/100ML-% IV SOLN
2.0000 g | INTRAVENOUS | Status: AC
Start: 2023-07-31 — End: 2023-07-31
  Administered 2023-07-31: 2 g via INTRAVENOUS

## 2023-07-31 MED ORDER — SODIUM CHLORIDE (PF) 0.9 % IJ SOLN
INTRAMUSCULAR | Status: AC
  Filled 2023-07-31: qty 10

## 2023-07-31 MED ORDER — LIDOCAINE HCL (PF) 1 % IJ SOLN
INTRAMUSCULAR | Status: AC
  Filled 2023-07-31: qty 30

## 2023-07-31 MED ORDER — 0.9 % SODIUM CHLORIDE (POUR BTL) OPTIME
TOPICAL | Status: DC | PRN
  Administered 2023-07-31: 1000 mL

## 2023-07-31 MED ORDER — ONDANSETRON HCL 4 MG/2ML IJ SOLN
INTRAMUSCULAR | Status: DC | PRN
  Administered 2023-07-31: 4 mg via INTRAVENOUS

## 2023-07-31 MED ORDER — DEXAMETHASONE SODIUM PHOSPHATE 4 MG/ML IJ SOLN
INTRAMUSCULAR | Status: DC | PRN
  Administered 2023-07-31: 5 mg via INTRAVENOUS

## 2023-07-31 MED ORDER — PHENYLEPHRINE 80 MCG/ML (10ML) SYRINGE FOR IV PUSH (FOR BLOOD PRESSURE SUPPORT)
PREFILLED_SYRINGE | INTRAVENOUS | Status: AC
  Filled 2023-07-31: qty 10

## 2023-07-31 MED ORDER — KETOROLAC TROMETHAMINE 30 MG/ML IJ SOLN
INTRAMUSCULAR | Status: DC | PRN
  Administered 2023-07-31: 30 mg via INTRAVENOUS

## 2023-07-31 SURGICAL SUPPLY — 44 items
ADH SKN CLS APL DERMABOND .7 (GAUZE/BANDAGES/DRESSINGS) ×1
BLADE CLIPPER SURG (BLADE) IMPLANT
BLADE HEX COATED 2.75 (ELECTRODE) IMPLANT
BLADE SURG 15 STRL LF DISP TIS (BLADE) ×1 IMPLANT
BLADE SURG 15 STRL SS (BLADE) ×1
CANISTER SUCT 1200ML W/VALVE (MISCELLANEOUS) IMPLANT
COVER BACK TABLE 60X90IN (DRAPES) ×1 IMPLANT
COVER MAYO STAND STRL (DRAPES) ×1 IMPLANT
DERMABOND ADVANCED .7 DNX12 (GAUZE/BANDAGES/DRESSINGS) IMPLANT
DRAPE LAPAROTOMY 100X72 PEDS (DRAPES) IMPLANT
DRAPE U-SHAPE 76X120 STRL (DRAPES) IMPLANT
DRESSING MEPILEX FLEX 4X4 (GAUZE/BANDAGES/DRESSINGS) IMPLANT
DRSG MEPILEX FLEX 4X4 (GAUZE/BANDAGES/DRESSINGS) ×1
ELECT COATED BLADE 2.86 ST (ELECTRODE) IMPLANT
ELECT REM PT RETURN 9FT ADLT (ELECTROSURGICAL) ×1
ELECTRODE REM PT RTRN 9FT ADLT (ELECTROSURGICAL) IMPLANT
GAUZE SPONGE 2X2 STRL 8-PLY (GAUZE/BANDAGES/DRESSINGS) IMPLANT
GAUZE SPONGE 4X4 12PLY STRL LF (GAUZE/BANDAGES/DRESSINGS) IMPLANT
GLOVE BIO SURGEON STRL SZ 6.5 (GLOVE) ×1 IMPLANT
GLOVE SURG SS PI 6.5 STRL IVOR (GLOVE) IMPLANT
GLOVE SURG SS PI 7.5 STRL IVOR (GLOVE) IMPLANT
GOWN STRL REUS W/ TWL LRG LVL3 (GOWN DISPOSABLE) ×1 IMPLANT
GOWN STRL REUS W/ TWL XL LVL3 (GOWN DISPOSABLE) IMPLANT
GOWN STRL REUS W/TWL LRG LVL3 (GOWN DISPOSABLE) ×2
GOWN STRL REUS W/TWL XL LVL3 (GOWN DISPOSABLE) ×1
NDL HYPO 25X1 1.5 SAFETY (NEEDLE) IMPLANT
NEEDLE HYPO 25X1 1.5 SAFETY (NEEDLE) ×1
NS IRRIG 1000ML POUR BTL (IV SOLUTION) IMPLANT
PACK BASIN DAY SURGERY FS (CUSTOM PROCEDURE TRAY) ×1 IMPLANT
PENCIL SMOKE EVACUATOR (MISCELLANEOUS) IMPLANT
SPONGE T-LAP 18X18 ~~LOC~~+RFID (SPONGE) IMPLANT
STRIP CLOSURE SKIN 1/2X4 (GAUZE/BANDAGES/DRESSINGS) IMPLANT
SUCTION TUBE FRAZIER 10FR DISP (SUCTIONS) IMPLANT
SUT ETHILON 4 0 PS 2 18 (SUTURE) IMPLANT
SUT MNCRL AB 4-0 PS2 18 (SUTURE) IMPLANT
SUT MON AB 3-0 SH 27 (SUTURE) ×1
SUT MON AB 3-0 SH27 (SUTURE) IMPLANT
SUT MON AB 5-0 PS2 18 (SUTURE) ×1 IMPLANT
SYR BULB EAR ULCER 3OZ GRN STR (SYRINGE) IMPLANT
SYR CONTROL 10ML LL (SYRINGE) ×1 IMPLANT
TOWEL GREEN STERILE FF (TOWEL DISPOSABLE) ×1 IMPLANT
TRAY DSU PREP LF (CUSTOM PROCEDURE TRAY) ×1 IMPLANT
TUBE CONNECTING 20X1/4 (TUBING) IMPLANT
YANKAUER SUCT BULB TIP NO VENT (SUCTIONS) IMPLANT

## 2023-07-31 NOTE — Transfer of Care (Signed)
Immediate Anesthesia Transfer of Care Note  Patient: SHARRONDA SCHWEERS  Procedure(s) Performed: left thigh excision of lipoma/mass (Left: Thigh)  Patient Location: PACU  Anesthesia Type:General  Level of Consciousness: awake, alert , oriented, drowsy, and patient cooperative  Airway & Oxygen Therapy: Patient Spontanous Breathing and Patient connected to face mask oxygen  Post-op Assessment: Report given to RN and Post -op Vital signs reviewed and stable  Post vital signs: Reviewed and stable  Last Vitals:  Vitals Value Taken Time  BP    Temp    Pulse 81 07/31/23 0920  Resp    SpO2 98 % 07/31/23 0920  Vitals shown include unfiled device data.  Last Pain:  Vitals:   07/31/23 2956  TempSrc: Temporal  PainSc: 0-No pain         Complications: No notable events documented.

## 2023-07-31 NOTE — Op Note (Signed)
DATE OF OPERATION: 07/31/2023  LOCATION: [facility]  PREOPERATIVE DIAGNOSIS: left medial thigh mass / lipoma  POSTOPERATIVE DIAGNOSIS: Same  PROCEDURE:  Excision of left medial thigh mass / lipoma 6 x 12 cm Excision of skin 2 x 2 cm  SURGEON: Foster Simpson, DO  ASSISTANT: [name], PA  EBL: none  CONDITION: Stable  COMPLICATIONS: None  INDICATION: The patient, Marie Nguyen, is a 67 y.o. female born on 1956/09/05, is here for treatment of a mass on the medial aspect of the left thigh.  It has been getting larger with time.   PROCEDURE DETAILS:  The patient was seen prior to surgery and marked.  The IV antibiotics were given. The patient was taken to the operating room and given a general anesthetic. A standard time out was performed and all information was confirmed by those in the room. SCDs were placed.   The patient was placed in the prone position.  Local was injected.  The #15 blade was used to make an incision over the area.  The bovie was used to dissect to the mass.  It looked like a lipoma.  It was released from the surrounding tissue with the bovie.  Hemostasis was achieved with the bovie.  It was 6 x 12 cm in size.  I then excised 2 x 2 cm of skin due to overlap.  The deep layers were closed with the 3-0 Monocryl.  The 4-0 Monocryl was used to close the dermal layer.  Derma bond and a sterile dressing was applied.   The patient was allowed to wake up and taken to recovery room in stable condition at the end of the case. The family was notified at the end of the case. Specimen sent to pathology.  The advanced practice practitioner (APP) assisted throughout the case.  The APP was essential in retraction and counter traction when needed to make the case progress smoothly.  This retraction and assistance made it possible to see the tissue plans for the procedure.  The assistance was needed for blood control, tissue re-approximation and assisted with  closure of the incision site.

## 2023-07-31 NOTE — Interval H&P Note (Signed)
History and Physical Interval Note:  07/31/2023 7:57 AM  Marie Nguyen  has presented today for surgery, with the diagnosis of Lipoma.  The various methods of treatment have been discussed with the patient and family. After consideration of risks, benefits and other options for treatment, the patient has consented to  Procedure(s): left thigh excision of lipoma/mass (Left) as a surgical intervention.  The patient's history has been reviewed, patient examined, no change in status, stable for surgery.  I have reviewed the patient's chart and labs.  Questions were answered to the patient's satisfaction.     Alena Bills Dossie Swor

## 2023-07-31 NOTE — Anesthesia Postprocedure Evaluation (Signed)
Anesthesia Post Note  Patient: KENNY STERN  Procedure(s) Performed: left thigh excision of lipoma/mass (Left: Thigh)     Patient location during evaluation: PACU Anesthesia Type: General Level of consciousness: awake and alert Pain management: pain level controlled Vital Signs Assessment: post-procedure vital signs reviewed and stable Respiratory status: spontaneous breathing, nonlabored ventilation, respiratory function stable and patient connected to nasal cannula oxygen Cardiovascular status: blood pressure returned to baseline and stable Postop Assessment: no apparent nausea or vomiting Anesthetic complications: no  No notable events documented.  Last Vitals:  Vitals:   07/31/23 1000 07/31/23 1015  BP: 124/76 (!) 150/85  Pulse: 64 79  Resp: 14 16  Temp:  (!) 36.2 C  SpO2: 98%     Last Pain:  Vitals:   07/31/23 1015  TempSrc: Temporal  PainSc: 2                  Shelton Silvas

## 2023-07-31 NOTE — Anesthesia Procedure Notes (Signed)
Procedure Name: Intubation Date/Time: 07/31/2023 8:38 AM  Performed by: Ronnette Hila, CRNAPre-anesthesia Checklist: Patient identified, Emergency Drugs available, Suction available and Patient being monitored Patient Re-evaluated:Patient Re-evaluated prior to induction Oxygen Delivery Method: Circle system utilized Preoxygenation: Pre-oxygenation with 100% oxygen Induction Type: IV induction Ventilation: Mask ventilation without difficulty Laryngoscope Size: Mac and 3 Grade View: Grade I Tube type: Oral Tube size: 7.0 mm Number of attempts: 1 Airway Equipment and Method: Stylet and Oral airway Placement Confirmation: ETT inserted through vocal cords under direct vision, positive ETCO2 and breath sounds checked- equal and bilateral Secured at: 22 cm Tube secured with: Tape Dental Injury: Teeth and Oropharynx as per pre-operative assessment

## 2023-07-31 NOTE — Anesthesia Preprocedure Evaluation (Addendum)
Anesthesia Evaluation  Patient identified by MRN, date of birth, ID band Patient awake    Reviewed: Allergy & Precautions, NPO status , Patient's Chart, lab work & pertinent test results  Airway Mallampati: I  TM Distance: >3 FB Neck ROM: Full    Dental  (+) Teeth Intact, Dental Advisory Given   Pulmonary neg pulmonary ROS   breath sounds clear to auscultation       Cardiovascular hypertension, Pt. on medications + Valvular Problems/Murmurs  Rhythm:Regular Rate:Normal     Neuro/Psych negative neurological ROS  negative psych ROS   GI/Hepatic Neg liver ROS,GERD  Medicated,,  Endo/Other  negative endocrine ROS    Renal/GU negative Renal ROS     Musculoskeletal  (+) Arthritis ,    Abdominal   Peds  Hematology negative hematology ROS (+)   Anesthesia Other Findings   Reproductive/Obstetrics                             Anesthesia Physical Anesthesia Plan  ASA: 2  Anesthesia Plan: General   Post-op Pain Management: Tylenol PO (pre-op)* and Toradol IV (intra-op)*   Induction: Intravenous  PONV Risk Score and Plan: 4 or greater and Ondansetron, Dexamethasone, Midazolam and Scopolamine patch - Pre-op  Airway Management Planned: LMA  Additional Equipment: None  Intra-op Plan:   Post-operative Plan: Extubation in OR  Informed Consent: I have reviewed the patients History and Physical, chart, labs and discussed the procedure including the risks, benefits and alternatives for the proposed anesthesia with the patient or authorized representative who has indicated his/her understanding and acceptance.     Dental advisory given  Plan Discussed with: CRNA  Anesthesia Plan Comments:        Anesthesia Quick Evaluation

## 2023-07-31 NOTE — Discharge Instructions (Addendum)
INSTRUCTIONS FOR AFTER SURGERY  You will likely have some questions about what to expect following your operation.  The following information will help you and your family understand what to expect when you get home.  Following these guidelines will help ensure a smooth recovery and reduce risks of complications.  Postoperative instructions include information on: diet, wound care, medications and physical activity.  AFTER SURGERY Expect to go home after the procedure.    DIET This surgery does not require a specific diet.  However, the healthier you eat the better your body can heal. It is important to increasing your protein intake.  Limit foods with high sugar and carbohydrate content.  Focus on vegetables, meat and other protein sources if you are vegan or vegetarian.  If your urine is bright yellow, then it is concentrated, and you need to drink more water.  If you find you are persistently nauseated or unable to take in liquids let us know.  NO TOBACCO USE or EXPOSURE.  This will slow your healing process and increase the risk of a wound.  WOUND CARE Wear a spanx for compression if you can.  You can shower the day after surgery.  If you have steri-strips / tape directly attached to your skin leave them in place. It is OK to get these wet.  No baths, pools or hot tubs for four weeks. We close your incision to leave the smallest and best-looking scar. No ointment or creams on your incisions until cleared by your surgeon.  No Neosporin (Too many skin reactions with this one).  After the steri-strips are off can use Mederma or Skinuva and start massaging the scar. Continue to wear the spanx around the clock, including while sleeping, for 2 weeks. This provides added comfort and helps reduce the fluid accumulation at the surgery site.  ACTIVITY No heavy lifting until cleared by the doctor.  For example, no more than a half-gallon of milk.  It is OK to walk as it will decrease your risk of getting  a blood clot.  It will also help keep you from getting deconditioned.  Every 1 to 2 hours get up and walk for 5 minutes. This will help with a quicker recovery back to normal.  Let pain be your guide so you don't do too much.     WORK Everyone returns to work at different times. As a rough guide, most people take 1 - 2 weeks off prior to returning to work. If you need documentation for your job give them to the front staff for processing.  DRIVING Arrange for someone to bring you home from the hospital.  You may be able to drive a few days after surgery but not while taking any narcotics or valium.  This is for your safety as well as others sharing the road with you.  BOWEL MOVEMENTS Constipation can occur after anesthesia and while taking pain medication.  It is important to stay ahead for your comfort.  We recommend taking Milk of Magnesia (2 tablespoons; twice a day) while taking the pain pills.  MEDICATIONS (you may receive and should be started after surgery) At your preoperative visit for you history and physical you were given the following medications: Antibiotic: Start this medication when you get home and take according to the instructions on the bottle. Zofran 4 mg:  This is to treat nausea and vomiting.  You can take this every 6 hours as needed and only if needed. Norco (hydrocodone/acetaminophen) 5/325 mg:  This is only to be used after you have taken the Motrin or the Tylenol. Every 8 hours as needed.  Over the counter Medication to take: Ibuprofen (Motrin) 600 mg:  Take this every 6 hours.  If you have additional pain then take 500 mg of the Tylenol.  Only take the Norco after you have tried these two. MiraLAX or stool softener of choice: Take this according to the bottle if you take the Norco.  May take Ibuprofen today after 3:05 PM  WHEN TO CALL Call your surgeon's office if any of the following occur:  Fever 101 degrees F or greater  Excessive bleeding or fluid from the  incision site.  Pain that increases over time without aid from the medications  Redness, warmth, or pus draining from incision sites  Persistent nausea or inability to take in liquids  Severe misshapen area that underwent the operation.   Post Anesthesia Home Care Instructions  Activity: Get plenty of rest for the remainder of the day. A responsible individual must stay with you for 24 hours following the procedure.  For the next 24 hours, DO NOT: -Drive a car -Advertising copywriter -Drink alcoholic beverages -Take any medication unless instructed by your physician -Make any legal decisions or sign important papers.  Meals: Start with liquid foods such as gelatin or soup. Progress to regular foods as tolerated. Avoid greasy, spicy, heavy foods. If nausea and/or vomiting occur, drink only clear liquids until the nausea and/or vomiting subsides. Call your physician if vomiting continues.  Special Instructions/Symptoms: Your throat may feel dry or sore from the anesthesia or the breathing tube placed in your throat during surgery. If this causes discomfort, gargle with warm salt water. The discomfort should disappear within 24 hours.  If you had a scopolamine patch placed behind your ear for the management of post- operative nausea and/or vomiting:  1. The medication in the patch is effective for 72 hours, after which it should be removed.  Wrap patch in a tissue and discard in the trash. Wash hands thoroughly with soap and water. 2. You may remove the patch earlier than 72 hours if you experience unpleasant side effects which may include dry mouth, dizziness or visual disturbances. 3. Avoid touching the patch. Wash your hands with soap and water after contact with the patch.

## 2023-08-01 ENCOUNTER — Encounter (HOSPITAL_BASED_OUTPATIENT_CLINIC_OR_DEPARTMENT_OTHER): Payer: Self-pay | Admitting: Plastic Surgery

## 2023-08-01 LAB — SURGICAL PATHOLOGY

## 2023-08-01 NOTE — Progress Notes (Signed)
Patient is a pleasant 67 year old female s/p excision of left thigh mass performed 07/31/2023 by Dr. Ulice Bold who joins via telephone for postoperative day 2 check-in.  Reviewed operative report and a 6 x 12 cm mass consistent with lipoma was excised at time of surgery.  Her incision was closed with Monocryl followed by Dermabond and sterile dressing.  Today, patient is doing really well.  She states that she feels great.  She does report feeling swollen at the excision site for the first 48 hours, but today it seems much softer and as though the swelling has subsided.  She denies any obvious drainage.  She denies any significant pain and is managing on over-the-counter Tylenol and ibuprofen alone.  She is ambulatory, voiding.  Tolerating p.o. intake without difficulty.  Denies any chest pain, fevers, difficulty breathing, or other concerns.  Plan follow-up next week, as scheduled.  She will call the clinic should she have any questions or concerns in interim.

## 2023-08-02 ENCOUNTER — Ambulatory Visit (INDEPENDENT_AMBULATORY_CARE_PROVIDER_SITE_OTHER): Payer: BC Managed Care – PPO | Admitting: Physician Assistant

## 2023-08-02 DIAGNOSIS — Z86018 Personal history of other benign neoplasm: Secondary | ICD-10-CM

## 2023-08-02 DIAGNOSIS — Z9889 Other specified postprocedural states: Secondary | ICD-10-CM

## 2023-08-08 NOTE — Progress Notes (Signed)
Patient is a pleasant 67 year old female s/p excision of left thigh mass performed 07/31/2023 by Dr. Ulice Bold who presents to clinic for postoperative follow-up.  Reviewed operative report and her incision was closed with Monocryl followed by Dermabond and sterile dressing.  The 6 x 12 cm mass excised was consistent with lipoma.  Today, patient is doing well.  She states that it has softened since telephone encounter shortly after surgery.  She also feels as though her discomfort has significantly improved and that she only takes ibuprofen periodically for pain.  She has noticed that she has considerable bruising down her entire upper leg, but that it is starting to turn yellowish.  She is not on any anticoagulation.  On exam, the excision site appears to be healing nicely.  Well-approximated, no incisional wounds noted.  She does have considerable bruising and ecchymoses down the posterior aspect of upper leg.  The area is soft, no pressure on the skin.  No palpable hematomas.  Varying stages of ecchymoses.  Recommend that she continue to wear her compressive shorts and take ibuprofen as needed for discomfort.  It appears to be healing nicely.  Incision CDI.  Replaced bordered Mepilex.  Follow-up in 2 weeks as scheduled.  Picture(s) obtained of the patient and placed in the chart were with the patient's or guardian's permission.

## 2023-08-09 ENCOUNTER — Ambulatory Visit (INDEPENDENT_AMBULATORY_CARE_PROVIDER_SITE_OTHER): Payer: BC Managed Care – PPO | Admitting: Physician Assistant

## 2023-08-09 ENCOUNTER — Encounter: Payer: BC Managed Care – PPO | Admitting: Plastic Surgery

## 2023-08-09 ENCOUNTER — Encounter: Payer: Self-pay | Admitting: Physician Assistant

## 2023-08-09 VITALS — BP 137/87 | HR 82

## 2023-08-09 DIAGNOSIS — Z9889 Other specified postprocedural states: Secondary | ICD-10-CM

## 2023-08-09 DIAGNOSIS — Z86018 Personal history of other benign neoplasm: Secondary | ICD-10-CM

## 2023-08-13 ENCOUNTER — Inpatient Hospital Stay: Payer: BC Managed Care – PPO | Attending: Oncology | Admitting: Nurse Practitioner

## 2023-08-13 ENCOUNTER — Encounter: Payer: Self-pay | Admitting: Oncology

## 2023-08-13 VITALS — BP 147/98 | HR 64 | Temp 98.2°F | Resp 16 | Ht 67.0 in | Wt 135.0 lb

## 2023-08-13 DIAGNOSIS — M810 Age-related osteoporosis without current pathological fracture: Secondary | ICD-10-CM | POA: Insufficient documentation

## 2023-08-13 DIAGNOSIS — M8580 Other specified disorders of bone density and structure, unspecified site: Secondary | ICD-10-CM | POA: Diagnosis not present

## 2023-08-13 DIAGNOSIS — Z5181 Encounter for therapeutic drug level monitoring: Secondary | ICD-10-CM

## 2023-08-13 DIAGNOSIS — C50912 Malignant neoplasm of unspecified site of left female breast: Secondary | ICD-10-CM

## 2023-08-13 DIAGNOSIS — Z170 Estrogen receptor positive status [ER+]: Secondary | ICD-10-CM

## 2023-08-13 DIAGNOSIS — C50911 Malignant neoplasm of unspecified site of right female breast: Secondary | ICD-10-CM

## 2023-08-13 DIAGNOSIS — Z08 Encounter for follow-up examination after completed treatment for malignant neoplasm: Secondary | ICD-10-CM

## 2023-08-13 DIAGNOSIS — Z79811 Long term (current) use of aromatase inhibitors: Secondary | ICD-10-CM

## 2023-08-13 NOTE — Progress Notes (Signed)
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    INTERVAL HISTORY: Patient returns to clinic today for routine 1-month evaluation.  She has now completed her reconstruction.  In interim, she had lipoma of the thigh removed with DR. Dillingham. She continues to tolerate letrozole well. Denies side effects. She continues to work and take care of her aging parents. Denies unintentional weight loss. No bone pain.    REVIEW OF SYSTEMS:   Review of Systems  Constitutional: Negative.  Negative for fever, malaise/fatigue and weight loss.  Respiratory: Negative.  Negative for cough, hemoptysis and shortness of breath.   Cardiovascular: Negative.  Negative for chest pain, palpitations and leg swelling.  Gastrointestinal: Negative.  Negative for abdominal pain.  Genitourinary: Negative.  Negative for dysuria.  Musculoskeletal: Negative.  Negative for back pain.  Skin: Negative.  Negative for rash.  Neurological: Negative.  Negative for dizziness, focal weakness, weakness and headaches.  Psychiatric/Behavioral: Negative.  The patient is not nervous/anxious.   As per HPI. Otherwise, a complete review of systems is negative.  PAST MEDICAL HISTORY: Past Medical History:  Diagnosis Date   Allergy    Arthritis    Eczema    GERD (gastroesophageal reflux disease)    Heart murmur    Hypertension     PAST SURGICAL HISTORY: Past Surgical History:  Procedure Laterality Date   ABDOMINAL HYSTERECTOMY     AUGMENTATION MAMMAPLASTY     AXILLARY SENTINEL NODE BIOPSY Bilateral 07/26/2022    Procedure: AXILLARY SENTINEL NODE BIOPSY;  Surgeon: Henrene Dodge, MD;  Location: [facility]S;  Service: General;  Laterality: Bilateral;   BREAST BIOPSY Right    neg   BREAST BIOPSY Right 05/16/2022   x 2 areas/ [place] bx & Stereo bx coil clip/INC and DCIS   BREAST BIOPSY Left 05/16/2022   [place] bx/ [facility] with DCIS   BREAST RECONSTRUCTION WITH PLACEMENT OF TISSUE EXPANDER AND ALLODERM Bilateral 07/26/2022   Procedure: BREAST RECONSTRUCTION WITH PLACEMENT OF TISSUE EXPANDER AND ALLODERM;  Surgeon: Peggye Form, DO;  Location: [facility];  Service: Plastics;  Laterality: Bilateral;   CESAREAN SECTION     COLONOSCOPY WITH PROPOFOL N/A 11/24/2018   Procedure: COLONOSCOPY WITH PROPOFOL;  Surgeon: Wyline Mood, MD;  Location: [facility] ENDOSCOPY;  Service: Gastroenterology;  Laterality: N/A;   COLONOSCOPY WITH PROPOFOL N/A 07/03/2021   Procedure: COLONOSCOPY WITH PROPOFOL;  Surgeon: Wyline Mood, MD;  Location: [facility] ENDOSCOPY;  Service: Gastroenterology;  Laterality: N/A;   GANGLION CYST EXCISION Right    wrist   LIPOMA EXCISION Left 07/31/2023   Procedure: left thigh excision of lipoma/mass;  Surgeon: Peggye Form, DO;  Location: [facility];  Service: Plastics;  Laterality: Left;   MASTECTOMY Bilateral    REMOVAL OF TISSUE EXPANDER AND PLACEMENT OF IMPLANT Bilateral 11/14/2022   Procedure: REMOVAL OF TISSUE EXPANDER AND PLACEMENT OF IMPLANT;  Surgeon: Peggye Form, DO;  Location: [facility];  Service: Plastics;  Laterality: Bilateral;   SIMPLE MASTECTOMY WITH AXILLARY SENTINEL NODE BIOPSY Bilateral 07/26/2022   Procedure: SIMPLE MASTECTOMY, Laqueta Due, PA-C to assist;  Surgeon: Henrene Dodge, MD;  Location: [facility];  Service:  General;  Laterality: Bilateral;   TOTAL ABDOMINAL HYSTERECTOMY      FAMILY HISTORY: Family History  Problem Relation Age of Onset   Breast cancer Mother 9   Hypertension Mother    Diabetes Father    Hypertension Father    Stroke  Father    Hypertension Sister    Colon cancer Maternal Grandfather 55   ADVANCED DIRECTIVES (Y/N):  N  HEALTH MAINTENANCE: Social History   Tobacco Use   Smoking status: Never    Passive exposure: Never   Smokeless tobacco: Never  Vaping Use   Vaping status: Never Used  Substance Use Topics   Alcohol use: Yes    Comment: rarely   Drug use: Never    Colonoscopy:  PAP:  Bone density:  Lipid panel:  Allergies  Allergen Reactions   Latex Rash    Current Outpatient Medications  Medication Sig Dispense Refill   azelastine (ASTELIN) 0.1 % nasal spray Place 1 spray into both nostrils daily. Use in each nostril as directed     fluticasone (FLONASE) 50 MCG/ACT nasal spray Place 1 spray into both nostrils daily as needed for allergies or rhinitis.     ibuprofen (ADVIL) 200 MG tablet Take 200 mg by mouth every 6 (six) hours as needed for mild pain. 2 - 3 tabs     letrozole (FEMARA) 2.5 MG tablet Take 1 tablet (2.5 mg total) by mouth daily. 90 tablet 3   lisinopril-hydrochlorothiazide (ZESTORETIC) 10-12.5 MG tablet Take 1 tablet by mouth daily. 90 tablet 1   Multiple Vitamin (MULTIVITAMIN) tablet Take 1 tablet by mouth daily.     Omega-3 Fatty Acids (FISH OIL) 1000 MG CAPS Take 1 capsule by mouth as needed.     omeprazole (PRILOSEC) 20 MG capsule Take 20 mg by mouth daily.     acetaminophen (TYLENOL) 500 MG tablet Take 1,000 mg by mouth every 6 (six) hours as needed for mild pain. (Patient not taking: Reported on 08/09/2023)     No current facility-administered medications for this visit.    OBJECTIVE: Vitals:   08/13/23 1529  BP: (!) 147/98  Pulse: 64  Resp: 16  Temp: 98.2 F (36.8 C)  SpO2: 100%     Body mass index is 21.14 kg/m.    ECOG FS:0 - Asymptomatic  General: Well-developed, well-nourished, no acute distress. Eyes: Pink conjunctiva, anicteric sclera. Lungs: Clear to auscultation bilaterally.  No audible wheezing or coughing Heart: Regular rate and rhythm.   Abdomen: Soft, nontender, nondistended.  Musculoskeletal: No edema, cyanosis, or clubbing. Neuro: Alert, answering all questions appropriately. Cranial nerves grossly intact. Skin: No rashes or petechiae noted. Psych: Normal affect. Breast: s/p mastectomy with reconstruction  LAB RESULTS: No labs on site today  STUDIES: No results found.  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  Oncotype DX 14.  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  Her right breast cancer was a pT1c, left breast cancer was pT1b.  Patient had bilateral mastectomy, therefore there is no need for adjuvant XRT.  Oncotype DX score was low risk at 14, therefore despite 2 primaries patient did not require adjuvant chemotherapy.  Continue letrozole for minimum of 5 years completing treatment in December 2028.  Will consider extending treatment given her high risk disease. Clinically asymptomatic of recurrence. Tolerating AI well without significant side effects. Return to clinic in 6 months with video-assisted telemedicine visit.  Osteopenia: Bone mineral density on August 24, 2022 revealed a T-score of -2.2.  Have recommended calcium and vitamin D supplementation.  Repeat in December 2024. Ordered today.  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  Disposition:  December 2024- Bone density 6 mo- Dr Orlie Dakin in person or virtually- [place]  Patient expressed understanding and was in agreement with this plan. She also understands that She can call clinic at any time with any questions, concerns, or complaints.    Cancer Staging  Bilateral breast cancer [facility]) Staging form: Breast, AJCC 8th Edition - Pathologic stage from 08/22/2022: Stage IA (pT1c, pN0, cM0, G3, ER+, PR+, HER2-, Oncotype DX score: 14) - Signed by Jeralyn Ruths, MD on 08/22/2022 Stage prefix:  Initial diagnosis Multigene prognostic tests performed: Oncotype DX Recurrence score range: Greater than or equal to 11 Histologic grading system: 3 grade system  Alinda Dooms, NP   08/13/2023

## 2023-08-19 ENCOUNTER — Other Ambulatory Visit: Payer: Self-pay | Admitting: Oncology

## 2023-08-20 ENCOUNTER — Encounter: Payer: Self-pay | Admitting: Physician Assistant

## 2023-08-20 ENCOUNTER — Ambulatory Visit (INDEPENDENT_AMBULATORY_CARE_PROVIDER_SITE_OTHER): Payer: BC Managed Care – PPO | Admitting: Physician Assistant

## 2023-08-20 VITALS — BP 145/87 | HR 65

## 2023-08-20 DIAGNOSIS — Z9889 Other specified postprocedural states: Secondary | ICD-10-CM

## 2023-08-20 DIAGNOSIS — Z86018 Personal history of other benign neoplasm: Secondary | ICD-10-CM

## 2023-08-20 NOTE — Progress Notes (Signed)
Patient is a pleasant 67 year old female s/p excision of left thigh mass performed 07/31/2023 by Dr. Ulice Bold who presents to clinic for postoperative follow-up.   She was last seen here in clinic on 08/09/2023.  At that time, she was doing well.  The surgical area had softened since her immediate postoperative period.  On exam, the excision site appeared to be healing quite nicely, well-approximated with no incisional wounds.  Ecchymoses noted along posterior aspect of thigh, but the area is soft and no palpable hematoma.  Recommended continued compressive shorts and ibuprofen as needed for discomfort.  Appear to be healing nicely.  Follow-up in 2 weeks.  Today, patient is doing okay.  She states that her excision site feels as though there is a mass/area of firmness that is tender.  She also has some additional tenderness distally on the posterior aspect of her thigh where she had had considerable ecchymoses.  She states that it is also itchy in that area.  Otherwise she is doing okay.  Denies any wounds or drainage.  Continues to wear the bordered Mepilex dressing.  On exam, she does have a palpable area of firmness approximately 6 x 6 cm underneath the excision site.  Likely hematoma/possible fat necrosis, either way will likely subside with gentle mechanical massage and time.  Patient understands that it may take several months before complete resolution.  No fluctuance or fluid wave shift.  No seroma or abscess formation.  No overlying skin changes look good.  Incision well-healed.  Her itching is likely related to the histamine response and breakdown of bilirubin in context of her significant ecchymoses.  No rash or dermatitis.  Follow-up in 3 weeks.  Picture(s) obtained of the patient and placed in the chart were with the patient's or guardian's permission.

## 2023-09-03 ENCOUNTER — Encounter: Payer: Self-pay | Admitting: Physician Assistant

## 2023-09-03 ENCOUNTER — Ambulatory Visit: Payer: BC Managed Care – PPO | Admitting: Physician Assistant

## 2023-09-03 VITALS — BP 145/68 | HR 69

## 2023-09-03 DIAGNOSIS — Z9889 Other specified postprocedural states: Secondary | ICD-10-CM

## 2023-09-03 DIAGNOSIS — Z86018 Personal history of other benign neoplasm: Secondary | ICD-10-CM

## 2023-09-03 NOTE — Progress Notes (Signed)
Referring Provider Duanne Limerick, MD [address] [place],  [place] [address]   CC:  Chief Complaint  Patient presents with   Follow-up      Marie Nguyen is an 67 y.o. female.  HPI: Patient is a pleasant 67 year old female s/p excision of left thigh mass performed 07/31/2023 by Dr. Ulice Bold who presents to clinic for postoperative follow-up.   She was last seen here in clinic on 08/20/2023.  At that time, she reported firmness/mass at the area of her excision as well as additional tenderness distally on the posterior aspect of her thigh where she had considerable ecchymoses postoperatively.  6 x 6 cm palpable area of firmness underneath the excision site, likely reflective of hematoma/fat necrosis/scar tissue formation.  No evidence concerning for infection or seroma.  Incision appeared well-healed.  Suspect that they will subside with mechanical massage and time.  Today, patient is doing well.  She states that she has been massaging the area of firmness beneath her excision site and that it has softened tremendously and is getting smaller.  As for the discomfort that she was experiencing down the posterior aspect of upper leg, she states that it is completely resolved and back to normal.  She is overall quite pleased with the outcome of her excision.   Allergies  Allergen Reactions   Latex Rash    Outpatient Encounter Medications as of 09/03/2023  Medication Sig   acetaminophen (TYLENOL) 500 MG tablet Take 1,000 mg by mouth every 6 (six) hours as needed for mild pain. (Patient not taking: Reported on 08/09/2023)   azelastine (ASTELIN) 0.1 % nasal spray Place 1 spray into both nostrils daily. Use in each nostril as directed   hydroquinone 4 % cream Apply topically at bedtime.   ibuprofen (ADVIL) 200 MG tablet Take 200 mg by mouth every 6 (six) hours as needed for mild pain. 2 - 3 tabs (Patient not taking: Reported on 09/03/2023)   letrozole (FEMARA) 2.5 MG tablet  Take 1 tablet (2.5 mg total) by mouth daily.   lisinopril-hydrochlorothiazide (ZESTORETIC) 10-12.5 MG tablet Take 1 tablet by mouth daily.   Multiple Vitamin (MULTIVITAMIN) tablet Take 1 tablet by mouth daily.   Omega-3 Fatty Acids (FISH OIL) 1000 MG CAPS Take 1 capsule by mouth as needed.   omeprazole (PRILOSEC) 20 MG capsule Take 20 mg by mouth daily.   tretinoin (RETIN-A) 0.025 % cream Apply topically at bedtime.   No facility-administered encounter medications on file as of 09/03/2023.     Past Medical History:  Diagnosis Date   Allergy    Arthritis    Eczema    GERD (gastroesophageal reflux disease)    Heart murmur    Hypertension     Past Surgical History:  Procedure Laterality Date   ABDOMINAL HYSTERECTOMY     AUGMENTATION MAMMAPLASTY     AXILLARY SENTINEL NODE BIOPSY Bilateral 07/26/2022   Procedure: AXILLARY SENTINEL NODE BIOPSY;  Surgeon: Henrene Dodge, MD;  Location: [facility];  Service: General;  Laterality: Bilateral;   BREAST BIOPSY Right    neg   BREAST BIOPSY Right 05/16/2022   x 2 areas/ [place] bx & Stereo bx coil clip/INC and DCIS   BREAST BIOPSY Left 05/16/2022   [place] bx/ [facility] with DCIS   BREAST RECONSTRUCTION WITH PLACEMENT OF TISSUE EXPANDER AND ALLODERM Bilateral 07/26/2022   Procedure: BREAST RECONSTRUCTION WITH PLACEMENT OF TISSUE EXPANDER AND ALLODERM;  Surgeon: Peggye Form, DO;  Location: [facility];  Service: Plastics;  Laterality: Bilateral;   CESAREAN SECTION     COLONOSCOPY WITH PROPOFOL N/A 11/24/2018   Procedure: COLONOSCOPY WITH PROPOFOL;  Surgeon: Wyline Mood, MD;  Location: [facility] END[facility]COPY;  Service: Gastroenterology;  Laterality: N/A;   COLONOSCOPY WITH PROPOFOL N/A 07/03/2021   Procedure: COLONOSCOPY WITH PROPOFOL;  Surgeon: Wyline Mood, MD;  Location: [facility] END[facility]COPY;  Service: Gastroenterology;  Laterality: N/A;   GANGLION CYST EXCISION Right    wrist   LIPOMA EXCISION Left 07/31/2023   Procedure: left thigh excision of lipoma/mass;   Surgeon: Peggye Form, DO;  Location: [facility];  Service: Plastics;  Laterality: Left;   MASTECTOMY Bilateral    REMOVAL OF TISSUE EXPANDER AND PLACEMENT OF IMPLANT Bilateral 11/14/2022   Procedure: REMOVAL OF TISSUE EXPANDER AND PLACEMENT OF IMPLANT;  Surgeon: Peggye Form, DO;  Location: [facility];  Service: Plastics;  Laterality: Bilateral;   SIMPLE MASTECTOMY WITH AXILLARY SENTINEL NODE BIOPSY Bilateral 07/26/2022   Procedure: SIMPLE MASTECTOMY, Laqueta Due, PA-C to assist;  Surgeon: Henrene Dodge, MD;  Location: [facility];  Service: General;  Laterality: Bilateral;   TOTAL ABDOMINAL HYSTERECTOMY      Family History  Problem Relation Age of Onset   Breast cancer Mother 52   Hypertension Mother    Diabetes Father    Hypertension Father    Stroke Father    Hypertension Sister    Colon cancer Maternal Grandfather 73    Social History   Social History Narrative   Lives alone     Review of Systems General: Denies fevers or chills Skin: Endorses softening of the excision site  Physical Exam    09/03/2023    9:48 AM 08/20/2023    9:32 AM 08/13/2023    3:29 PM  Vitals with BMI  Height   5\' 7"   Weight   135 lbs  BMI   21.14  Systolic 145 145 253  Diastolic 68 87 98  Pulse 69 65 64    General:  No acute distress, nontoxic appearing  Respiratory: No increased work of breathing Neuro: Alert and oriented Psychiatric: Normal mood and affect  Skin: The area of firmness beneath the excision site has softened compared to prior exam, possibly a bit smaller.  Still approximately 4 x 5 cm in size.  No open skin changes.  Incision well-healed.  No residual ecchymoses over posterior aspect of upper leg.  Assessment/Plan  S/p excision of lipoma left upper leg:  She feels as though the area of firmness at excision site has softened considerably and is much smaller.  She is much less tender on exam.  She states that she is doing well and  is pleased with the outcome.  Picture(s) obtained of the patient and placed in the chart were with the patient's or guardian's permission.  Patient impressed upon reviewing the before-and-after photos.  She will follow-up in 3 months if there is still persistent area of firmness, otherwise no specific follow-up needed.  Recommended continued mechanical massage 1-2 times daily.  She will also use a cream that she obtained to help with any scar formation.  [name] PA-C 09/03/2023, 10:03 AM

## 2023-10-31 ENCOUNTER — Other Ambulatory Visit: Payer: BC Managed Care – PPO

## 2023-11-14 ENCOUNTER — Other Ambulatory Visit: Payer: BC Managed Care – PPO

## 2023-11-14 ENCOUNTER — Telehealth: Payer: Self-pay | Admitting: *Deleted

## 2023-11-14 MED ORDER — LETROZOLE 2.5 MG PO TABS: 2.5000 mg | ORAL_TABLET | Freq: Every day | ORAL | 0 refills | Status: DC

## 2023-11-14 NOTE — Telephone Encounter (Signed)
Pt called and has 2 days left of letrozole and needs refill and new pharmacy [facility] in [place]. I sent refill  and sent to [facility] to sign

## 2023-11-21 ENCOUNTER — Ambulatory Visit
Admission: RE | Admit: 2023-11-21 | Discharge: 2023-11-21 | Disposition: A | Discharge status: Home / Self Care | Payer: BC Managed Care – PPO | Source: Ambulatory Visit | Attending: Nurse Practitioner | Admitting: Nurse Practitioner

## 2023-11-21 DIAGNOSIS — Z5181 Encounter for therapeutic drug level monitoring: Secondary | ICD-10-CM | POA: Insufficient documentation

## 2023-11-21 DIAGNOSIS — Z79811 Long term (current) use of aromatase inhibitors: Secondary | ICD-10-CM | POA: Insufficient documentation

## 2023-11-21 DIAGNOSIS — Z780 Asymptomatic menopausal state: Secondary | ICD-10-CM | POA: Diagnosis not present

## 2023-11-21 DIAGNOSIS — M810 Age-related osteoporosis without current pathological fracture: Secondary | ICD-10-CM | POA: Diagnosis not present

## 2023-11-27 ENCOUNTER — Ambulatory Visit: Payer: BC Managed Care – PPO | Admitting: Physician Assistant

## 2023-11-27 ENCOUNTER — Encounter: Payer: Self-pay | Admitting: Physician Assistant

## 2023-11-27 VITALS — BP 146/76 | HR 84

## 2023-11-27 DIAGNOSIS — Z86018 Personal history of other benign neoplasm: Secondary | ICD-10-CM

## 2023-11-27 DIAGNOSIS — Z09 Encounter for follow-up examination after completed treatment for conditions other than malignant neoplasm: Secondary | ICD-10-CM | POA: Diagnosis not present

## 2023-11-27 DIAGNOSIS — Z9889 Other specified postprocedural states: Secondary | ICD-10-CM

## 2023-11-27 NOTE — Progress Notes (Signed)
 Referring Provider Duanne Limerick, MD [address] [place],  [place] [address]   CC:  Chief Complaint  Patient presents with   Follow-up      Marie Nguyen is an 68 y.o. female.  HPI: Patient is a pleasant 68 year old female s/p excision of left thigh mass performed 07/31/2023 by Dr. Ulice Bold who presents to clinic for postoperative follow-up.   She was last seen here in clinic on 09/03/2023.  At that time, she reported a persistent area of firmness/mass at the area of her excision site.  It had softened tremendously and was getting smaller and she was overall quite pleased with the outcome of her excision.  Recommended follow-up in 3 months if there is still persistent area of firmness, otherwise no specific follow-up needed.  Recommended continued mechanical massage as well as topical scar treatments.  Today, she is doing well.  She states that the area has gotten progressively smaller and at this time is barely noticeable.  She states that she no longer has the issues of "sitting on it" that she had prior to the surgical excision.  She is overall quite pleased with the results and is not interested in any reexcision.  She feels as though the area will continue to soften with continued time and massage.   Allergies  Allergen Reactions   Latex Rash    Outpatient Encounter Medications as of 11/27/2023  Medication Sig   acetaminophen (TYLENOL) 500 MG tablet Take 1,000 mg by mouth every 6 (six) hours as needed for mild pain. (Patient not taking: Reported on 08/09/2023)   azelastine (ASTELIN) 0.1 % nasal spray Place 1 spray into both nostrils daily. Use in each nostril as directed   hydroquinone 4 % cream Apply topically at bedtime.   ibuprofen (ADVIL) 200 MG tablet Take 200 mg by mouth every 6 (six) hours as needed for mild pain. 2 - 3 tabs (Patient not taking: Reported on 09/03/2023)   letrozole (FEMARA) 2.5 MG tablet Take 1 tablet (2.5 mg total) by mouth daily.    lisinopril-hydrochlorothiazide (ZESTORETIC) 10-12.5 MG tablet Take 1 tablet by mouth daily.   Multiple Vitamin (MULTIVITAMIN) tablet Take 1 tablet by mouth daily.   Omega-3 Fatty Acids (FISH OIL) 1000 MG CAPS Take 1 capsule by mouth as needed.   omeprazole (PRILOSEC) 20 MG capsule Take 20 mg by mouth daily.   tretinoin (RETIN-A) 0.025 % cream Apply topically at bedtime.   No facility-administered encounter medications on file as of 11/27/2023.     Past Medical History:  Diagnosis Date   Allergy    Arthritis    Eczema    GERD (gastroesophageal reflux disease)    Heart murmur    Hypertension     Past Surgical History:  Procedure Laterality Date   ABDOMINAL HYSTERECTOMY     AUGMENTATION MAMMAPLASTY     AXILLARY SENTINEL NODE BIOPSY Bilateral 07/26/2022   Procedure: AXILLARY SENTINEL NODE BIOPSY;  Surgeon: Henrene Dodge, MD;  Location: [facility];  Service: General;  Laterality: Bilateral;   BREAST BIOPSY Right    neg   BREAST BIOPSY Right 05/16/2022   x 2 areas/ [place] bx & Stereo bx coil clip/INC and DCIS   BREAST BIOPSY Left 05/16/2022   [place] bx/ [facility] with DCIS   BREAST RECONSTRUCTION WITH PLACEMENT OF TISSUE EXPANDER AND ALLODERM Bilateral 07/26/2022   Procedure: BREAST RECONSTRUCTION WITH PLACEMENT OF TISSUE EXPANDER AND ALLODERM;  Surgeon: Peggye Form, DO;  Location: [facility];  Service: Plastics;  Laterality: Bilateral;   CESAREAN SECTION     COLONOSCOPY WITH PROPOFOL N/A 11/24/2018   Procedure: COLONOSCOPY WITH PROPOFOL;  Surgeon: Wyline Mood, MD;  Location: [facility] ENDOSCOPY;  Service: Gastroenterology;  Laterality: N/A;   COLONOSCOPY WITH PROPOFOL N/A 07/03/2021   Procedure: COLONOSCOPY WITH PROPOFOL;  Surgeon: Wyline Mood, MD;  Location: [facility] END[facility]COPY;  Service: Gastroenterology;  Laterality: N/A;   GANGLION CYST EXCISION Right    wrist   LIPOMA EXCISION Left 07/31/2023   Procedure: left thigh excision of lipoma/mass;  Surgeon: Peggye Form, DO;  Location: [facility];  Service: Plastics;  Laterality: Left;   MASTECTOMY Bilateral    REMOVAL OF TISSUE EXPANDER AND PLACEMENT OF IMPLANT Bilateral 11/14/2022   Procedure: REMOVAL OF TISSUE EXPANDER AND PLACEMENT OF IMPLANT;  Surgeon: Peggye Form, DO;  Location: [facility];  Service: Plastics;  Laterality: Bilateral;   SIMPLE MASTECTOMY WITH AXILLARY SENTINEL NODE BIOPSY Bilateral 07/26/2022   Procedure: SIMPLE MASTECTOMY, Laqueta Due, PA-C to assist;  Surgeon: Henrene Dodge, MD;  Location: [facility];  Service: General;  Laterality: Bilateral;   TOTAL ABDOMINAL HYSTERECTOMY      Family History  Problem Relation Age of Onset   Breast cancer Mother 105   Hypertension Mother    Diabetes Father    Hypertension Father    Stroke Father    Hypertension Sister    Colon cancer Maternal Grandfather 23    Social History   Social History Narrative   Lives alone     Review of Systems General: Denies fevers or chills Skin: Continued softening of area of firmness  Physical Exam    09/03/2023    9:48 AM 08/20/2023    9:32 AM 08/13/2023    3:29 PM  Vitals with BMI  Height   5\' 7"   Weight   135 lbs  BMI   21.14  Systolic 145 145 578  Diastolic 68 87 98  Pulse 69 65 64    General:  No acute distress, nontoxic appearing  Respiratory: No increased work of breathing Neuro: Alert and oriented Psychiatric: Normal mood and affect  Skin: 1 x 2 cm area of firmness at the site of her excision, likely scar tissue formation versus resolving fat necrosis.  The excision repair is well-healed, hyperpigmented.  Assessment/Plan  S/p excision of lipoma left upper leg:  She has had continued softening of the area through longer tender on exam.  She is pleased.  Recommended continued mechanical massage until it is fully softened.  She can also use a cream to help with scarring.  No specific follow-up needed, but she is certainly welcome to call the office if she have any questions  or concerns in the future.  Picture(s) obtained of the patient and placed in the chart were with the patient's or guardian's permission.   [name] PA-C 11/27/2023, 1:09 PM

## 2023-12-12 ENCOUNTER — Ambulatory Visit: Payer: BC Managed Care – PPO | Admitting: Family Medicine

## 2023-12-12 ENCOUNTER — Encounter: Payer: Self-pay | Admitting: Family Medicine

## 2023-12-12 VITALS — BP 120/82 | HR 78 | Ht 67.0 in | Wt 134.0 lb

## 2023-12-12 DIAGNOSIS — I10 Essential (primary) hypertension: Secondary | ICD-10-CM | POA: Diagnosis not present

## 2023-12-12 MED ORDER — LISINOPRIL-HYDROCHLOROTHIAZIDE 10-12.5 MG PO TABS: 1.0000 | ORAL_TABLET | Freq: Every day | ORAL | 1 refills | Status: DC

## 2023-12-12 NOTE — Progress Notes (Signed)
 Date:  12/12/2023   Name:  Marie Nguyen   DOB:  November 15, 1955   MRN:  [id number]   Chief Complaint: Hypertension  Hypertension This is a chronic problem. The current episode started more than 1 year ago. The problem has been gradually improving since onset. The problem is controlled. Pertinent negatives include no anxiety, blurred vision, chest pain, headaches, malaise/fatigue, orthopnea, palpitations, peripheral edema, PND, shortness of breath or sweats. There are no associated agents to hypertension. Past treatments include ACE inhibitors and diuretics. The current treatment provides moderate improvement. There are no compliance problems.  There is no history of CAD/MI or CVA. There is no history of chronic renal disease, a hypertension causing med or renovascular disease.    Lab Results  Component Value Date   NA 139 07/26/2023   K 3.7 07/26/2023   CO2 28 07/26/2023   GLUCOSE 97 07/26/2023   BUN 13 07/26/2023   CREATININE 0.67 07/26/2023   CALCIUM 9.4 07/26/2023   EGFR 97 12/22/2021   GFRNONAA >60 07/26/2023   Lab Results  Component Value Date   CHOL 208 (H) 12/04/2022   HDL 71 12/04/2022   LDLCALC 124 (H) 12/04/2022   TRIG 75 12/04/2022   Lab Results  Component Value Date   TSH 2.810 11/19/2018   No results found for: "HGBA1C" Lab Results  Component Value Date   WBC 4.7 12/04/2022   HGB 13.5 12/04/2022   HCT 41.2 12/04/2022   MCV 96 12/04/2022   PLT 269 12/04/2022   Lab Results  Component Value Date   ALT 16 03/10/2021   AST 18 03/10/2021   ALKPHOS 58 03/10/2021   BILITOT 1.3 (H) 03/10/2021   No results found for: "25OHVITD2", "25OHVITD3", "VD25OH"   Review of Systems  Constitutional:  Negative for malaise/fatigue and unexpected weight change.  Eyes:  Negative for blurred vision and visual disturbance.  Respiratory:  Negative for cough, choking, chest tightness, shortness of breath and wheezing.   Cardiovascular:  Negative for chest pain,  palpitations, orthopnea and PND.  Gastrointestinal:  Negative for abdominal pain, anal bleeding and blood in stool.  Genitourinary:  Negative for difficulty urinating, hematuria and vaginal bleeding.  Neurological:  Negative for headaches.    Patient Active Problem List   Diagnosis Date Noted   Osteopenia 08/13/2023   Acquired absence of breast and absent nipple, bilateral 02/08/2023   Mass of thigh 02/08/2023   Genetic testing 10/23/2022   Bilateral breast cancer (HCC) 07/26/2022   H/O total hysterectomy 12/22/2021   Lumbar spondylosis 09/20/2021   Right hip pain 09/20/2021   Skin nodule 06/30/2013    Allergies  Allergen Reactions   Latex Rash    Past Surgical History:  Procedure Laterality Date   ABDOMINAL HYSTERECTOMY     AUGMENTATION MAMMAPLASTY     AXILLARY SENTINEL NODE BIOPSY Bilateral 07/26/2022   Procedure: AXILLARY SENTINEL NODE BIOPSY;  Surgeon: Henrene Dodge, MD;  Location: [facility];  Service: General;  Laterality: Bilateral;   BREAST BIOPSY Right    neg   BREAST BIOPSY Right 05/16/2022   x 2 areas/ [place] bx & Stereo bx coil clip/INC and DCIS   BREAST BIOPSY Left 05/16/2022   [place] bx/ [facility] with DCIS   BREAST RECONSTRUCTION WITH PLACEMENT OF TISSUE EXPANDER AND ALLODERM Bilateral 07/26/2022   Procedure: BREAST RECONSTRUCTION WITH PLACEMENT OF TISSUE EXPANDER AND ALLODERM;  Surgeon: Peggye Form, DO;  Location: [facility];  Service: Plastics;  Laterality: Bilateral;   CESAREAN SECTION  COLONOSCOPY WITH PROPOFOL N/A 11/24/2018   Procedure: COLONOSCOPY WITH PROPOFOL;  Surgeon: Wyline Mood, MD;  Location: [facility] ENDOSCOPY;  Service: Gastroenterology;  Laterality: N/A;   COLONOSCOPY WITH PROPOFOL N/A 07/03/2021   Procedure: COLONOSCOPY WITH PROPOFOL;  Surgeon: Wyline Mood, MD;  Location: [facility] ENDOSCOPY;  Service: Gastroenterology;  Laterality: N/A;   GANGLION CYST EXCISION Right    wrist   LIPOMA EXCISION Left 07/31/2023   Procedure: left thigh excision of  lipoma/mass;  Surgeon: Peggye Form, DO;  Location: [facility];  Service: Plastics;  Laterality: Left;   MASTECTOMY Bilateral    REMOVAL OF TISSUE EXPANDER AND PLACEMENT OF IMPLANT Bilateral 11/14/2022   Procedure: REMOVAL OF TISSUE EXPANDER AND PLACEMENT OF IMPLANT;  Surgeon: Peggye Form, DO;  Location: [facility];  Service: Plastics;  Laterality: Bilateral;   SIMPLE MASTECTOMY WITH AXILLARY SENTINEL NODE BIOPSY Bilateral 07/26/2022   Procedure: SIMPLE MASTECTOMY, Laqueta Due, PA-C to assist;  Surgeon: Henrene Dodge, MD;  Location: [facility];  Service: General;  Laterality: Bilateral;   TOTAL ABDOMINAL HYSTERECTOMY      Social History   Tobacco Use   Smoking status: Never    Passive exposure: Never   Smokeless tobacco: Never  Vaping Use   Vaping status: Never Used  Substance Use Topics   Alcohol use: Yes    Comment: rarely   Drug use: Never     Medication list has been reviewed and updated.  Current Meds  Medication Sig   acetaminophen (TYLENOL) 500 MG tablet Take 1,000 mg by mouth every 6 (six) hours as needed for mild pain (pain score 1-3).   azelastine (ASTELIN) 0.1 % nasal spray Place 1 spray into both nostrils daily. Use in each nostril as directed   hydroquinone 4 % cream Apply topically at bedtime.   ibuprofen (ADVIL) 200 MG tablet Take 200 mg by mouth every 6 (six) hours as needed for mild pain (pain score 1-3). 2 - 3 tabs   letrozole (FEMARA) 2.5 MG tablet Take 1 tablet (2.5 mg total) by mouth daily.   lisinopril-hydrochlorothiazide (ZESTORETIC) 10-12.5 MG tablet Take 1 tablet by mouth daily.   Multiple Vitamin (MULTIVITAMIN) tablet Take 1 tablet by mouth daily.   Omega-3 Fatty Acids (FISH OIL) 1000 MG CAPS Take 1 capsule by mouth as needed.   omeprazole (PRILOSEC) 20 MG capsule Take 20 mg by mouth daily.   tretinoin (RETIN-A) 0.025 % cream Apply topically at bedtime.       12/12/2023    2:58 PM 06/11/2023    2:51 PM  12/04/2022   10:05 AM 06/01/2022    9:04 AM  GAD 7 : Generalized Anxiety Score  Nervous, Anxious, on Edge 0 0 0 0  Control/stop worrying 0 0 0 0  Worry too much - different things 0 0 0 0  Trouble relaxing 0 0 0 0  Restless 0 0 0 0  Easily annoyed or irritable 0 0 0 0  Afraid - awful might happen 0 0 0 0  Total GAD 7 Score 0 0 0 0  Anxiety Difficulty Not difficult at all Not difficult at all Not difficult at all Not difficult at all       12/12/2023    2:58 PM 06/11/2023    2:51 PM 12/04/2022   10:05 AM  Depression screen PHQ 2/9  Decreased Interest 0 0 0  Down, Depressed, Hopeless 0 0 0  PHQ - 2 Score 0 0 0  Altered sleeping  0 0  Tired, decreased energy  0 0  Change in appetite  0 0  Feeling bad or failure about yourself   0 0  Trouble concentrating  0 0  Moving slowly or fidgety/restless  0 0  Suicidal thoughts  0 0  PHQ-9 Score  0 0  Difficult doing work/chores  Not difficult at all Not difficult at all    BP Readings from Last 3 Encounters:  12/12/23 120/82  11/27/23 (!) 146/76  09/03/23 (!) 145/68    Physical Exam Vitals and nursing note reviewed.  Constitutional:      General: She is not in acute distress.    Appearance: She is not diaphoretic.  HENT:     Head: Normocephalic and atraumatic.     Right Ear: Tympanic membrane and external ear normal.     Left Ear: Tympanic membrane and external ear normal.     Nose: Nose normal.     Mouth/Throat:     Mouth: Mucous membranes are moist.  Eyes:     General:        Right eye: No discharge.        Left eye: No discharge.     Conjunctiva/sclera: Conjunctivae normal.     Pupils: Pupils are equal, round, and reactive to light.  Neck:     Thyroid: No thyromegaly.     Vascular: No JVD.  Cardiovascular:     Rate and Rhythm: Normal rate and regular rhythm.     Heart sounds: Normal heart sounds. No murmur heard.    No friction rub. No gallop.  Pulmonary:     Effort: Pulmonary effort is normal.     Breath sounds:  Normal breath sounds. No wheezing, rhonchi or rales.  Abdominal:     General: Bowel sounds are normal.     Palpations: Abdomen is soft. There is no hepatomegaly, splenomegaly or mass.     Tenderness: There is no abdominal tenderness. There is no guarding.  Musculoskeletal:        General: Normal range of motion.     Cervical back: Normal range of motion and neck supple.  Lymphadenopathy:     Cervical: No cervical adenopathy.  Skin:    General: Skin is warm and dry.  Neurological:     Mental Status: She is alert.     Deep Tendon Reflexes: Reflexes are normal and symmetric.     Wt Readings from Last 3 Encounters:  12/12/23 134 lb (60.8 kg)  08/13/23 135 lb (61.2 kg)  07/31/23 136 lb 14.5 oz (62.1 kg)    BP 120/82   Pulse 78   Ht 5\' 7"  (1.702 m)   Wt 134 lb (60.8 kg)   SpO2 97%   BMI 20.99 kg/m   Assessment and Plan: 1. Primary hypertension (Primary) Chronic.  Controlled.  Stable.  Blood pressure today is 120/82.  Asymptomatic.  Tolerating medications well.  Will continue lisinopril hydrochlorothiazide 10-12.5 mg once a day.  Will recheck in 6 months.  Review of previous labs were less than 6 months and we will hold on labs until next visit. - lisinopril-hydrochlorothiazide (ZESTORETIC) 10-12.5 MG tablet; Take 1 tablet by mouth daily.  Dispense: 90 tablet; Refill: 1     Elizabeth Sauer, MD

## 2023-12-13 DIAGNOSIS — L309 Dermatitis, unspecified: Secondary | ICD-10-CM | POA: Diagnosis not present

## 2023-12-17 ENCOUNTER — Ambulatory Visit: Payer: BC Managed Care – PPO | Admitting: Plastic Surgery

## 2024-02-06 ENCOUNTER — Telehealth: Payer: Self-pay | Admitting: *Deleted

## 2024-02-06 MED ORDER — LETROZOLE 2.5 MG PO TABS: 2.5000 mg | ORAL_TABLET | Freq: Every day | ORAL | 0 refills | Status: DC

## 2024-02-06 NOTE — Telephone Encounter (Signed)
 Patient is going out of town tomorrow and she will be gone for 2 weeks.  Refill is made for May 20.  She needs the medicine  now so she can get it earlier  because of going out of town. Patient awaes that the rx was sent to [facility]

## 2024-02-10 ENCOUNTER — Inpatient Hospital Stay: Payer: BC Managed Care – PPO | Admitting: Oncology

## 2024-02-27 ENCOUNTER — Inpatient Hospital Stay: Payer: Self-pay | Attending: Oncology | Admitting: Oncology

## 2024-02-27 DIAGNOSIS — Z170 Estrogen receptor positive status [ER+]: Secondary | ICD-10-CM | POA: Diagnosis not present

## 2024-02-27 DIAGNOSIS — Z79811 Long term (current) use of aromatase inhibitors: Secondary | ICD-10-CM

## 2024-02-27 DIAGNOSIS — C50912 Malignant neoplasm of unspecified site of left female breast: Secondary | ICD-10-CM

## 2024-02-27 DIAGNOSIS — C50911 Malignant neoplasm of unspecified site of right female breast: Secondary | ICD-10-CM

## 2024-02-27 DIAGNOSIS — M810 Age-related osteoporosis without current pathological fracture: Secondary | ICD-10-CM

## 2024-02-27 MED ORDER — ALENDRONATE SODIUM 70 MG PO TABS
70.0000 mg | ORAL_TABLET | ORAL | 3 refills | Status: AC
Start: 2024-02-27 — End: ?

## 2024-02-27 NOTE — Progress Notes (Signed)
 [facility]  [phone] [phone]  ID: Marie Nguyen OB: 1955-12-20  MR#: [id number]  AOZ#:[id number]  Patient Care Team: Clarise Crooks, MD (Inactive) as PCP - General (Family Medicine) Jerlean Mood, MD (General Surgery) Vergil Glasser., MD (Inactive) (Family Medicine) Waverly Hageman, RN as Oncology Nurse Navigator Adrian Alba, Deadra Everts, MD as Consulting Physician (Oncology)  I connected with Marie Nguyen on 02/27/24 at  3:30 PM EDT by video enabled telemedicine visit and verified that I am speaking with the correct person using two identifiers.   I discussed the limitations, risks, security and privacy concerns of performing an evaluation and management service by telemedicine and the availability of in-person appointments. I also discussed with the patient that there may be a patient responsible charge related to this service. The patient expressed understanding and agreed to proceed.   Other persons participating in the visit and their role in the encounter: Patient, MD.  Patient's location: Home. Provider's location: Clinic.  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    INTERVAL HISTORY: Patient agreed to an assisted telemedicine visit for routine 61-month evaluation.  She currently feels well and is asymptomatic.  She is tolerating letrozole  without significant side effects. She has no neurologic complaints.  She denies any recent fevers or illnesses.  She has a good appetite and denies weight loss.  She has no chest pain, shortness of breath, cough, or home this.  She denies any nausea, vomiting, constipation, or diarrhea.  She has no urinary complaints.  Patient offers no specific complaints today.  REVIEW OF SYSTEMS:   Review of Systems  Constitutional: Negative.  Negative for fever, malaise/fatigue and weight loss.  Respiratory: Negative.  Negative for cough, hemoptysis  and shortness of breath.   Cardiovascular: Negative.  Negative for chest pain, palpitations and leg swelling.  Gastrointestinal: Negative.  Negative for abdominal pain.  Genitourinary: Negative.  Negative for dysuria.  Musculoskeletal: Negative.  Negative for back pain.  Skin: Negative.  Negative for rash.  Neurological: Negative.  Negative for dizziness, focal weakness, weakness and headaches.  Psychiatric/Behavioral: Negative.  The patient is not nervous/anxious.     As per HPI. Otherwise, a complete review of systems is negative.  PAST MEDICAL HISTORY: Past Medical History:  Diagnosis Date   Allergy    Arthritis    Eczema    GERD (gastroesophageal reflux disease)    Heart murmur    Hypertension     PAST SURGICAL HISTORY: Past Surgical History:  Procedure Laterality Date   ABDOMINAL HYSTERECTOMY     AUGMENTATION MAMMAPLASTY     AXILLARY SENTINEL NODE BIOPSY Bilateral 07/26/2022   Procedure: AXILLARY SENTINEL NODE BIOPSY;  Surgeon: Emmalene Hare, MD;  Location: [facility];  Service: General;  Laterality: Bilateral;   BREAST BIOPSY Right    neg   BREAST BIOPSY Right 05/16/2022   x 2 areas/ us  bx & Stereo bx coil clip/INC and DCIS   BREAST BIOPSY Left 05/16/2022   us  bx/ [facility] with DCIS   BREAST RECONSTRUCTION WITH PLACEMENT OF TISSUE EXPANDER AND ALLODERM Bilateral 07/26/2022   Procedure: BREAST RECONSTRUCTION WITH PLACEMENT OF TISSUE EXPANDER AND ALLODERM;  Surgeon: Thornell Flirt, DO;  Location: [facility];  Service: Plastics;  Laterality: Bilateral;   CESAREAN SECTION     COLONOSCOPY WITH PROPOFOL  N/A 11/24/2018   Procedure: COLONOSCOPY WITH PROPOFOL ;  Surgeon: Luke Salaam, MD;  Location: [facility] ENDOSCOPY;  Service: Gastroenterology;  Laterality: N/A;  COLONOSCOPY WITH PROPOFOL  N/A 07/03/2021   Procedure: COLONOSCOPY WITH PROPOFOL ;  Surgeon: Luke Salaam, MD;  Location: [facility] ENDOSCOPY;  Service: Gastroenterology;  Laterality: N/A;   GANGLION CYST EXCISION Right    wrist    LIPOMA EXCISION Left 07/31/2023   Procedure: left thigh excision of lipoma/mass;  Surgeon: Thornell Flirt, DO;  Location:  SURGERY CENTER;  Service: Plastics;  Laterality: Left;   MASTECTOMY Bilateral    REMOVAL OF TISSUE EXPANDER AND PLACEMENT OF IMPLANT Bilateral 11/14/2022   Procedure: REMOVAL OF TISSUE EXPANDER AND PLACEMENT OF IMPLANT;  Surgeon: Thornell Flirt, DO;  Location:  SURGERY CENTER;  Service: Plastics;  Laterality: Bilateral;   SIMPLE MASTECTOMY WITH AXILLARY SENTINEL NODE BIOPSY Bilateral 07/26/2022   Procedure: SIMPLE MASTECTOMY, Gabriela Johns, PA-C to assist;  Surgeon: Emmalene Hare, MD;  Location: [facility];  Service: General;  Laterality: Bilateral;   TOTAL ABDOMINAL HYSTERECTOMY      FAMILY HISTORY: Family History  Problem Relation Age of Onset   Breast cancer Mother 56   Hypertension Mother    Diabetes Father    Hypertension Father    Stroke Father    Hypertension Sister    Colon cancer Maternal Grandfather 67    ADVANCED DIRECTIVES (Y/N):  N  HEALTH MAINTENANCE: Social History   Tobacco Use   Smoking status: Never    Passive exposure: Never   Smokeless tobacco: Never  Vaping Use   Vaping status: Never Used  Substance Use Topics   Alcohol use: Yes    Comment: rarely   Drug use: Never     Colonoscopy:  PAP:  Bone density:  Lipid panel:  Allergies  Allergen Reactions   Latex Rash    Current Outpatient Medications  Medication Sig Dispense Refill   acetaminophen  (TYLENOL ) 500 MG tablet Take 1,000 mg by mouth every 6 (six) hours as needed for mild pain (pain score 1-3).     azelastine (ASTELIN) 0.1 % nasal spray Place 1 spray into both nostrils daily. Use in each nostril as directed     hydroquinone 4 % cream Apply topically at bedtime.     ibuprofen (ADVIL) 200 MG tablet Take 200 mg by mouth every 6 (six) hours as needed for mild pain (pain score 1-3). 2 - 3 tabs     letrozole  (FEMARA ) 2.5 MG tablet Take 1 tablet  (2.5 mg total) by mouth daily. 90 tablet 0   lisinopril -hydrochlorothiazide  (ZESTORETIC ) 10-12.5 MG tablet Take 1 tablet by mouth daily. 90 tablet 1   Multiple Vitamin (MULTIVITAMIN) tablet Take 1 tablet by mouth daily.     Omega-3 Fatty Acids (FISH OIL) 1000 MG CAPS Take 1 capsule by mouth as needed.     omeprazole (PRILOSEC) 20 MG capsule Take 20 mg by mouth daily.     tretinoin (RETIN-A) 0.025 % cream Apply topically at bedtime.     No current facility-administered medications for this visit.    OBJECTIVE: There were no vitals filed for this visit.    There is no height or weight on file to calculate BMI.    ECOG FS:0 - Asymptomatic  General: Well-developed, well-nourished, no acute distress. HEENT: Normocephalic. Neuro: Alert, answering all questions appropriately. Cranial nerves grossly intact. Psych: Normal affect.  LAB RESULTS:  Lab Results  Component Value Date   NA 139 07/26/2023   K 3.7 07/26/2023   CL 103 07/26/2023   CO2 28 07/26/2023   GLUCOSE 97 07/26/2023   BUN 13 07/26/2023   CREATININE 0.67 07/26/2023  CALCIUM 9.4 07/26/2023   PROT 6.9 03/10/2021   ALBUMIN 4.7 12/22/2021   AST 18 03/10/2021   ALT 16 03/10/2021   ALKPHOS 58 03/10/2021   BILITOT 1.3 (H) 03/10/2021   GFRNONAA >60 07/26/2023   GFRAA 102 11/19/2018    Lab Results  Component Value Date   WBC 4.7 12/04/2022   NEUTROABS 2.6 12/04/2022   HGB 13.5 12/04/2022   HCT 41.2 12/04/2022   MCV 96 12/04/2022   PLT 269 12/04/2022     STUDIES: No results found.  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  Oncotype DX 14.  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  Her right breast cancer was a pT1c, left breast cancer was pT1b.  Patient had bilateral mastectomy, therefore there is no need for adjuvant XRT.  Oncotype DX score was low risk at 14, therefore despite 2  primaries patient did not require adjuvant chemotherapy.  Continue letrozole  for minimum of 5 years completing treatment in December 2028.  Will consider extending treatment given her high risk disease.  No further intervention is needed.  Return to clinic in 6 months with video-assisted telemedicine visit.  Osteoporosis: Patient's most recent bone mineral density on November 21, 2023 reported T-score of -2.7.  Patient was given a prescription for Fosamax along with continuation of calcium and vitamin D supplementation.  Repeat bone mineral density in February 2026.   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  I provided 20 minutes of face-to-face video visit time during this encounter which included chart review, counseling, and coordination of care as documented above.       Patient expressed understanding and was in agreement with this plan. She also understands that She can call clinic at any time with any questions, concerns, or complaints.    Cancer Staging  Bilateral breast cancer [facility]) Staging form: Breast, AJCC 8th Edition - Pathologic stage from 08/22/2022: Stage IA (pT1c, pN0, cM0, G3, ER+, PR+, HER2-, Oncotype DX score: 14) - Signed by Shellie Dials, MD on 08/22/2022 Stage prefix: Initial diagnosis Multigene prognostic tests performed: Oncotype DX Recurrence score range: Greater than or equal to 11 Histologic grading system: 3 grade system   Shellie Dials, MD   02/27/2024 3:18 PM    Both right and left breast cancer were stage Ia.

## 2024-05-03 ENCOUNTER — Other Ambulatory Visit: Payer: Self-pay | Admitting: Oncology

## 2024-05-26 ENCOUNTER — Ambulatory Visit (INDEPENDENT_AMBULATORY_CARE_PROVIDER_SITE_OTHER): Admitting: Student

## 2024-05-26 ENCOUNTER — Encounter: Payer: Self-pay | Admitting: Student

## 2024-05-26 VITALS — BP 110/74 | HR 60 | Ht 67.0 in | Wt 137.0 lb

## 2024-05-26 DIAGNOSIS — K219 Gastro-esophageal reflux disease without esophagitis: Secondary | ICD-10-CM | POA: Insufficient documentation

## 2024-05-26 DIAGNOSIS — Z23 Encounter for immunization: Secondary | ICD-10-CM

## 2024-05-26 DIAGNOSIS — I10 Essential (primary) hypertension: Secondary | ICD-10-CM | POA: Insufficient documentation

## 2024-05-26 DIAGNOSIS — M818 Other osteoporosis without current pathological fracture: Secondary | ICD-10-CM

## 2024-05-26 DIAGNOSIS — E782 Mixed hyperlipidemia: Secondary | ICD-10-CM

## 2024-05-26 DIAGNOSIS — C50911 Malignant neoplasm of unspecified site of right female breast: Secondary | ICD-10-CM

## 2024-05-26 DIAGNOSIS — C50912 Malignant neoplasm of unspecified site of left female breast: Secondary | ICD-10-CM

## 2024-05-26 DIAGNOSIS — Z9109 Other allergy status, other than to drugs and biological substances: Secondary | ICD-10-CM | POA: Diagnosis not present

## 2024-05-26 NOTE — Patient Instructions (Addendum)
 Please call [facility] to schedule your colonoscopy [address]  [address]  [place], [place] [address]  Main: [phone]  Fax: [phone]

## 2024-05-26 NOTE — Assessment & Plan Note (Signed)
 Stable on prilosec 20 mg daily.

## 2024-05-26 NOTE — Assessment & Plan Note (Signed)
 Well controled on lisinopril -hydrochlorothiazide  10-12.5mg  daily. BMP today.

## 2024-05-26 NOTE — Assessment & Plan Note (Signed)
 Environmental allergies well controlled with zyrtec and azelastine spray.

## 2024-05-26 NOTE — Assessment & Plan Note (Signed)
 Stomach pain, body aches,  and headaches on alendronate . She took 3 doses.

## 2024-05-26 NOTE — Progress Notes (Unsigned)
 Established Patient Office Visit  Subjective   Patient ID: Marie Nguyen, female    DOB: 26-May-1956  Age: 68 y.o. MRN: [id number]  Chief Complaint  Patient presents with   Establish Care    Patient here today to establish care with new pcp    [name] with medical hx listed below presents today for transfer of care. Previously seeing Dr. Joshua who recently retired. Please refer to problem based charting for further details and assessment and plan of current problem and chronic medical conditions.  Patient Active Problem List   Diagnosis Date Noted   HLD (hyperlipidemia) 05/27/2024   Hypertension 05/26/2024   GERD (gastroesophageal reflux disease) 05/26/2024   Environmental allergies 05/26/2024   Osteoporosis 08/13/2023   Acquired absence of breast and absent nipple, bilateral 02/08/2023   Genetic testing 10/23/2022   Bilateral breast cancer (HCC) 07/26/2022   H/O total hysterectomy 12/22/2021   Lumbar spondylosis 09/20/2021      ROS Refer to HPI    Objective:     Outpatient Encounter Medications as of 05/26/2024  Medication Sig   acetaminophen  (TYLENOL ) 500 MG tablet Take 1,000 mg by mouth every 6 (six) hours as needed for mild pain (pain score 1-3).   alendronate  (FOSAMAX ) 70 MG tablet Take 1 tablet (70 mg total) by mouth once a week. Take with a full glass of water  on an empty stomach.   azelastine (ASTELIN) 0.1 % nasal spray Place 1 spray into both nostrils daily. Use in each nostril as directed   calcium-vitamin D (OSCAL WITH D) 500-5 MG-MCG tablet Take 1 tablet by mouth.   hydroquinone 4 % cream Apply topically at bedtime.   ibuprofen (ADVIL) 200 MG tablet Take 200 mg by mouth every 6 (six) hours as needed for mild pain (pain score 1-3). 2 - 3 tabs   letrozole  (FEMARA ) 2.5 MG tablet TAKE 1 TABLET BY MOUTH EVERY DAY   lisinopril -hydrochlorothiazide  (ZESTORETIC ) 10-12.5 MG tablet Take 1 tablet by mouth daily.   Multiple Vitamin (MULTIVITAMIN) tablet Take  1 tablet by mouth daily.   Omega-3 Fatty Acids (FISH OIL) 1000 MG CAPS Take 1 capsule by mouth as needed.   omeprazole (PRILOSEC) 20 MG capsule Take 20 mg by mouth daily.   tretinoin (RETIN-A) 0.025 % cream Apply topically at bedtime.   triamcinolone  cream (KENALOG ) 0.1 % Apply 1 Application topically 2 (two) times daily.   No facility-administered encounter medications on file as of 05/26/2024.    BP 110/74   Pulse 60   Ht 5' 7 (1.702 m)   Wt 137 lb (62.1 kg)   SpO2 98%   BMI 21.46 kg/m  BP Readings from Last 3 Encounters:  05/26/24 110/74  12/12/23 120/82  11/27/23 (!) 146/76    Physical Exam Constitutional:      Appearance: Normal appearance.  HENT:     Head: Normocephalic and atraumatic.  Cardiovascular:     Rate and Rhythm: Normal rate and regular rhythm.  Pulmonary:     Effort: Pulmonary effort is normal.     Breath sounds: No rhonchi or rales.  Abdominal:     General: Abdomen is flat. Bowel sounds are normal. There is no distension.     Palpations: Abdomen is soft.     Tenderness: There is no abdominal tenderness.  Musculoskeletal:        General: Normal range of motion.     Right lower leg: No edema.     Left lower leg: No edema.  Skin:  General: Skin is warm and dry.     Capillary Refill: Capillary refill takes less than 2 seconds.  Neurological:     General: No focal deficit present.     Mental Status: She is alert and oriented to person, place, and time.  Psychiatric:        Mood and Affect: Mood normal.        Behavior: Behavior normal.        12/12/2023    2:58 PM 06/11/2023    2:51 PM 12/04/2022   10:05 AM  Depression screen PHQ 2/9  Decreased Interest 0 0 0  Down, Depressed, Hopeless 0 0 0  PHQ - 2 Score 0 0 0  Altered sleeping  0 0  Tired, decreased energy  0 0  Change in appetite  0 0  Feeling bad or failure about yourself   0 0  Trouble concentrating  0 0  Moving slowly or fidgety/restless  0 0  Suicidal thoughts  0 0  PHQ-9 Score  0  0  Difficult doing work/chores  Not difficult at all Not difficult at all       12/12/2023    2:58 PM 06/11/2023    2:51 PM 12/04/2022   10:05 AM 06/01/2022    9:04 AM  GAD 7 : Generalized Anxiety Score  Nervous, Anxious, on Edge 0 0 0 0  Control/stop worrying 0 0 0 0  Worry too much - different things 0 0 0 0  Trouble relaxing 0 0 0 0  Restless 0 0 0 0  Easily annoyed or irritable 0 0 0 0  Afraid - awful might happen 0 0 0 0  Total GAD 7 Score 0 0 0 0  Anxiety Difficulty Not difficult at all Not difficult at all Not difficult at all Not difficult at all    Results for orders placed or performed in visit on 05/26/24  Basic metabolic panel with GFR  Result Value Ref Range   Glucose 89 70 - 99 mg/dL   BUN 22 8 - 27 mg/dL   Creatinine, Ser 9.25 0.57 - 1.00 mg/dL   eGFR 90 >40 fO/fpw/8.26   BUN/Creatinine Ratio 30 (H) 12 - 28   Sodium 141 134 - 144 mmol/L   Potassium 4.2 3.5 - 5.2 mmol/L   Chloride 102 96 - 106 mmol/L   CO2 25 20 - 29 mmol/L   Calcium 10.1 8.7 - 10.3 mg/dL  Lipid panel  Result Value Ref Range   Cholesterol, Total 197 100 - 199 mg/dL   Triglycerides 882 0 - 149 mg/dL   HDL 73 >60 mg/dL   VLDL Cholesterol Cal 20 5 - 40 mg/dL   LDL Chol Calc (NIH) 895 (H) 0 - 99 mg/dL   Chol/HDL Ratio 2.7 0.0 - 4.4 ratio      The 10-year ASCVD risk score (Arnett DK, et al., 2019) is: 4.9%    Assessment & Plan:  Other osteoporosis without current pathological fracture Assessment & Plan: Reports stomach pain, body aches,  and headaches on alendronate . She took 3 doses and discontinued with resolution of symptoms. BMD on 11/21/2023 with osteoporosis of the R radius, is on letrozole  for hx of breast cancer. Discussed increased risk of pathologic fx given osteoporosis and on letrozole . Discussed zolendronic acid and denosumab as alternatives. She is hesitant to start new medication would like to speak with oncologist regarding this. Has follow up in December. Continue vitamin D and  calcium, weight bearing exercise.  Primary hypertension Assessment & Plan: Well controled on lisinopril -hydrochlorothiazide  10-12.5mg  daily. BMP today.  Orders: -     Basic metabolic panel with GFR  Gastroesophageal reflux disease, unspecified whether esophagitis present Assessment & Plan: Stable on prilosec 20 mg daily.    Environmental allergies Assessment & Plan: Environmental allergies well controlled with zyrtec and azelastine spray.    Mixed hyperlipidemia Assessment & Plan: Not currently on medication for this.  Lipid Panel     Component Value Date/Time   CHOL 197 05/26/2024 1644   TRIG 117 05/26/2024 1644   HDL 73 05/26/2024 1644   CHOLHDL 2.7 05/26/2024 1644   LDLCALC 104 (H) 05/26/2024 1644   LABVLDL 20 05/26/2024 1644  The 10-year ASCVD risk score (Arnett DK, et al., 2019) is: 4.9%. She is low risk, continue diet and exercise.    Orders: -     Lipid panel  Immunization due  Encounter for immunization -     Flu vaccine HIGH DOSE PF(Fluzone Trivalent)  Bilateral malignant neoplasm of breast in female, unspecified estrogen receptor status, unspecified site of breast [facility]) Assessment & Plan: S/p bilateral mastectomy, on letrozole . Follows with oncology.       Return in about 6 months (around 11/23/2024) for HTN.    Harlene Saddler, MD

## 2024-05-27 ENCOUNTER — Ambulatory Visit: Payer: Self-pay | Admitting: Student

## 2024-05-27 DIAGNOSIS — E785 Hyperlipidemia, unspecified: Secondary | ICD-10-CM | POA: Insufficient documentation

## 2024-05-27 LAB — BASIC METABOLIC PANEL WITH GFR
BUN/Creatinine Ratio: 30 — ABNORMAL HIGH (ref 12–28)
BUN: 22 mg/dL (ref 8–27)
CO2: 25 mmol/L (ref 20–29)
Calcium: 10.1 mg/dL (ref 8.7–10.3)
Chloride: 102 mmol/L (ref 96–106)
Creatinine, Ser: 0.74 mg/dL (ref 0.57–1.00)
Glucose: 89 mg/dL (ref 70–99)
Potassium: 4.2 mmol/L (ref 3.5–5.2)
Sodium: 141 mmol/L (ref 134–144)
eGFR: 90 mL/min/1.73 (ref >59)

## 2024-05-27 LAB — LIPID PANEL
Chol/HDL Ratio: 2.7 ratio (ref 0.0–4.4)
Cholesterol, Total: 197 mg/dL (ref 100–199)
HDL: 73 mg/dL (ref >39)
LDL Chol Calc (NIH): 104 mg/dL — ABNORMAL HIGH (ref 0–99)
Triglycerides: 117 mg/dL (ref 0–149)
VLDL Cholesterol Cal: 20 mg/dL (ref 5–40)

## 2024-05-27 NOTE — Assessment & Plan Note (Signed)
 S/p bilateral mastectomy, on letrozole . Follows with oncology.

## 2024-05-27 NOTE — Assessment & Plan Note (Signed)
 Not currently on medication for this.  Lipid Panel     Component Value Date/Time   CHOL 197 05/26/2024 1644   TRIG 117 05/26/2024 1644   HDL 73 05/26/2024 1644   CHOLHDL 2.7 05/26/2024 1644   LDLCALC 104 (H) 05/26/2024 1644   LABVLDL 20 05/26/2024 1644  The 10-year ASCVD risk score (Arnett DK, et al., 2019) is: 4.9%. She is low risk, continue diet and exercise.

## 2024-08-06 ENCOUNTER — Other Ambulatory Visit: Payer: Self-pay | Admitting: Oncology

## 2024-08-07 ENCOUNTER — Other Ambulatory Visit: Payer: Self-pay | Admitting: Student

## 2024-08-07 DIAGNOSIS — I10 Essential (primary) hypertension: Secondary | ICD-10-CM

## 2024-08-07 NOTE — Telephone Encounter (Signed)
 Copied from CRM #[id number]. Topic: Clinical - Medication Refill >> Aug 07, 2024 10:17 AM Delon T wrote: Medication: lisinopril -hydrochlorothiazide  (ZESTORETIC ) 10-12.5 MG tablet  Has the patient contacted their pharmacy? Yes (Agent: If no, request that the patient contact the pharmacy for the refill. If patient does not wish to contact the pharmacy document the reason why and proceed with request.) (Agent: If yes, when and what did the pharmacy advise?)  This is the patient's preferred pharmacy:  [id number] - [place], [place] - [address] [place] [address] Phone: [phone] Fax: [phone]  Is this the correct pharmacy for this prescription? Yes If no, delete pharmacy and type the correct one.   Has the prescription been filled recently? Yes  Is the patient out of the medication? Yes  Has the patient been seen for an appointment in the last year OR does the patient have an upcoming appointment? Yes  Can we respond through MyChart? Yes  Agent: Please be advised that Rx refills may take up to 3 business days. We ask that you follow-up with your pharmacy.

## 2024-08-09 MED ORDER — LISINOPRIL-HYDROCHLOROTHIAZIDE 10-12.5 MG PO TABS: 1.0000 | ORAL_TABLET | Freq: Every day | ORAL | 1 refills | Status: AC

## 2024-08-09 NOTE — Telephone Encounter (Signed)
 Requested Prescriptions  Pending Prescriptions Disp Refills   lisinopril -hydrochlorothiazide  (ZESTORETIC ) 10-12.5 MG tablet 90 tablet 1    Sig: Take 1 tablet by mouth daily.     Cardiovascular:  ACEI + Diuretic Combos Passed - 08/09/2024  2:13 PM      Passed - Na in normal range and within 180 days    Sodium  Date Value Ref Range Status  05/26/2024 141 134 - 144 mmol/L Final         Passed - K in normal range and within 180 days    Potassium  Date Value Ref Range Status  05/26/2024 4.2 3.5 - 5.2 mmol/L Final         Passed - Cr in normal range and within 180 days    Creatinine, Ser  Date Value Ref Range Status  05/26/2024 0.74 0.57 - 1.00 mg/dL Final         Passed - eGFR is 30 or above and within 180 days    GFR calc Af Amer  Date Value Ref Range Status  11/19/2018 102 >59 mL/min/1.73 Final   GFR, Estimated  Date Value Ref Range Status  07/26/2023 >60 >60 mL/min Final    Comment:    (NOTE) Calculated using the CKD-EPI Creatinine Equation (2021)    eGFR  Date Value Ref Range Status  05/26/2024 90 >59 mL/min/1.73 Final         Passed - Patient is not pregnant      Passed - Last BP in normal range    BP Readings from Last 1 Encounters:  05/26/24 110/74         Passed - Valid encounter within last 6 months    Recent Outpatient Visits           2 months ago Other osteoporosis without current pathological fracture   [facility], MD   8 months ago Primary hypertension    [facility] at [facility] Cathryne JAYSON, MD       Future Appointments             In 2 weeks Finnegan, Evalene PARAS, MD [facility] - A Dept Of [place]. [facility]

## 2024-08-27 ENCOUNTER — Inpatient Hospital Stay: Admitting: Oncology

## 2024-08-27 NOTE — Progress Notes (Unsigned)
 [facility]  [phone] [phone]  ID: Marie Nguyen OB: 05/19/1956  MR#: [id number]  RDW#:[id number]  Patient Care Team: Marie Raisin, MD as PCP - General (Internal Medicine) Marie Louanne MATSU, MD (General Surgery) Marie Lynwood VEAR Mickey., MD (Inactive) (Family Medicine) Georgina Shasta POUR, RN as Oncology Nurse Navigator Jacobo, Evalene PARAS, MD as Consulting Physician (Oncology)  I connected with Marie Nguyen on 08/27/24 at  3:30 PM EST by video enabled telemedicine visit and verified that I am speaking with the correct person using two identifiers.   I discussed the limitations, risks, security and privacy concerns of performing an evaluation and management service by telemedicine and the availability of in-person appointments. I also discussed with the patient that there may be a patient responsible charge related to this service. The patient expressed understanding and agreed to proceed.   Other persons participating in the visit and their role in the encounter: Patient, MD.  Patient's location: Home. Provider's location: Clinic.  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    INTERVAL HISTORY: Patient agreed to an assisted telemedicine visit for routine 44-month evaluation.  She currently feels well and is asymptomatic.  She is tolerating letrozole  without significant side effects. She has no neurologic complaints.  She denies any recent fevers or illnesses.  She has a good appetite and denies weight loss.  She has no chest pain, shortness of breath, cough, or home this.  She denies any nausea, vomiting, constipation, or diarrhea.  She has no urinary complaints.  Patient offers no specific complaints today.  REVIEW OF SYSTEMS:   Review of Systems  Constitutional: Negative.  Negative for fever, malaise/fatigue and weight loss.  Respiratory: Negative.  Negative for cough, hemoptysis and  shortness of breath.   Cardiovascular: Negative.  Negative for chest pain, palpitations and leg swelling.  Gastrointestinal: Negative.  Negative for abdominal pain.  Genitourinary: Negative.  Negative for dysuria.  Musculoskeletal: Negative.  Negative for back pain.  Skin: Negative.  Negative for rash.  Neurological: Negative.  Negative for dizziness, focal weakness, weakness and headaches.  Psychiatric/Behavioral: Negative.  The patient is not nervous/anxious.     As per HPI. Otherwise, a complete review of systems is negative.  PAST MEDICAL HISTORY: Past Medical History:  Diagnosis Date   Allergy    Arthritis    Eczema    GERD (gastroesophageal reflux disease)    Heart murmur    Hypertension     PAST SURGICAL HISTORY: Past Surgical History:  Procedure Laterality Date   ABDOMINAL HYSTERECTOMY     AUGMENTATION MAMMAPLASTY     AXILLARY SENTINEL NODE BIOPSY Bilateral 07/26/2022   Procedure: AXILLARY SENTINEL NODE BIOPSY;  Surgeon: Desiderio Schanz, MD;  Location: [facility] ORS;  Service: General;  Laterality: Bilateral;   BREAST BIOPSY Right    neg   BREAST BIOPSY Right 05/16/2022   x 2 areas/ us  bx & Stereo bx coil clip/INC and DCIS   BREAST BIOPSY Left 05/16/2022   us  bx/ [facility] with DCIS   BREAST RECONSTRUCTION WITH PLACEMENT OF TISSUE EXPANDER AND ALLODERM Bilateral 07/26/2022   Procedure: BREAST RECONSTRUCTION WITH PLACEMENT OF TISSUE EXPANDER AND ALLODERM;  Surgeon: Lowery Estefana RAMAN, DO;  Location: [facility] ORS;  Service: Plastics;  Laterality: Bilateral;   CESAREAN SECTION     COLONOSCOPY WITH PROPOFOL  N/A 11/24/2018   Procedure: COLONOSCOPY WITH PROPOFOL ;  Surgeon: Therisa Bi, MD;  Location[facility] ENDOSCOPY;  Service: Gastroenterology;  Laterality: N/A;   COLONOSCOPY WITH  PROPOFOL  N/A 07/03/2021   Procedure: COLONOSCOPY WITH PROPOFOL ;  Surgeon: Therisa Bi, MD;  Location: [facility] ENDOSCOPY;  Service: Gastroenterology;  Laterality: N/A;   GANGLION CYST EXCISION Right    wrist    LIPOMA EXCISION Left 07/31/2023   Procedure: left thigh excision of lipoma/mass;  Surgeon: Lowery Estefana RAMAN, DO;  Location: [facility];  Service: Plastics;  Laterality: Left;   MASTECTOMY Bilateral    REMOVAL OF TISSUE EXPANDER AND PLACEMENT OF IMPLANT Bilateral 11/14/2022   Procedure: REMOVAL OF TISSUE EXPANDER AND PLACEMENT OF IMPLANT;  Surgeon: Lowery Estefana RAMAN, DO;  Location: [facility];  Service: Plastics;  Laterality: Bilateral;   SIMPLE MASTECTOMY WITH AXILLARY SENTINEL NODE BIOPSY Bilateral 07/26/2022   Procedure: SIMPLE MASTECTOMY, Darlyn Platt, PA-C to assist;  Surgeon: Desiderio Schanz, MD;  Location: [facility] ORS;  Service: General;  Laterality: Bilateral;   TOTAL ABDOMINAL HYSTERECTOMY      FAMILY HISTORY: Family History  Problem Relation Age of Onset   Breast cancer Mother 12   Hypertension Mother    Diabetes Father    Hypertension Father    Stroke Father    Hypertension Sister    Colon cancer Maternal Grandfather 39    ADVANCED DIRECTIVES (Y/N):  N  HEALTH MAINTENANCE: Social History   Tobacco Use   Smoking status: Never    Passive exposure: Never   Smokeless tobacco: Never  Vaping Use   Vaping status: Never Used  Substance Use Topics   Alcohol use: Yes    Comment: rarely   Drug use: Never     Colonoscopy:  PAP:  Bone density:  Lipid panel:  Allergies  Allergen Reactions   Latex Rash    Current Outpatient Medications  Medication Sig Dispense Refill   acetaminophen  (TYLENOL ) 500 MG tablet Take 1,000 mg by mouth every 6 (six) hours as needed for mild pain (pain score 1-3).     alendronate  (FOSAMAX ) 70 MG tablet Take 1 tablet (70 mg total) by mouth once a week. Take with a full glass of water  on an empty stomach. 12 tablet 3   azelastine (ASTELIN) 0.1 % nasal spray Place 1 spray into both nostrils daily. Use in each nostril as directed     calcium-vitamin D (OSCAL WITH D) 500-5 MG-MCG tablet Take 1 tablet by mouth.      hydroquinone 4 % cream Apply topically at bedtime.     ibuprofen (ADVIL) 200 MG tablet Take 200 mg by mouth every 6 (six) hours as needed for mild pain (pain score 1-3). 2 - 3 tabs     letrozole  (FEMARA ) 2.5 MG tablet TAKE 1 TABLET BY MOUTH EVERY DAY 90 tablet 0   lisinopril -hydrochlorothiazide  (ZESTORETIC ) 10-12.5 MG tablet Take 1 tablet by mouth daily. 90 tablet 1   Multiple Vitamin (MULTIVITAMIN) tablet Take 1 tablet by mouth daily.     Omega-3 Fatty Acids (FISH OIL) 1000 MG CAPS Take 1 capsule by mouth as needed.     omeprazole (PRILOSEC) 20 MG capsule Take 20 mg by mouth daily.     tretinoin (RETIN-A) 0.025 % cream Apply topically at bedtime.     triamcinolone  cream (KENALOG ) 0.1 % Apply 1 Application topically 2 (two) times daily.     No current facility-administered medications for this visit.    OBJECTIVE: There were no vitals filed for this visit.    There is no height or weight on file to calculate BMI.    ECOG FS:0 - Asymptomatic  General: Well-developed, well-nourished, no  acute distress. HEENT: Normocephalic. Neuro: Alert, answering all questions appropriately. Cranial nerves grossly intact. Psych: Normal affect.  LAB RESULTS:  Lab Results  Component Value Date   NA 141 05/26/2024   K 4.2 05/26/2024   CL 102 05/26/2024   CO2 25 05/26/2024   GLUCOSE 89 05/26/2024   BUN 22 05/26/2024   CREATININE 0.74 05/26/2024   CALCIUM 10.1 05/26/2024   PROT 6.9 03/10/2021   ALBUMIN 4.7 12/22/2021   AST 18 03/10/2021   ALT 16 03/10/2021   ALKPHOS 58 03/10/2021   BILITOT 1.3 (H) 03/10/2021   GFRNONAA >60 07/26/2023   GFRAA 102 11/19/2018    Lab Results  Component Value Date   WBC 4.7 12/04/2022   NEUTROABS 2.6 12/04/2022   HGB 13.5 12/04/2022   HCT 41.2 12/04/2022   MCV 96 12/04/2022   PLT 269 12/04/2022     STUDIES: No results found.  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  Oncotype DX 14.  PLAN:    Bilateral ER/PR  positive, HER2 negative invasive carcinoma of breast: Both right and left breast cancer were stage Ia.  Her right breast cancer was a pT1c, left breast cancer was pT1b.  Patient had bilateral mastectomy, therefore there is no need for adjuvant XRT.  Oncotype DX score was low risk at 14, therefore despite 2 primaries patient did not require adjuvant chemotherapy.  Continue letrozole  for minimum of 5 years completing treatment in December 2028.  Will consider extending treatment given her high risk disease.  No further intervention is needed.  Return to clinic in 6 months with video-assisted telemedicine visit.  Osteoporosis: Patient's most recent bone mineral density on November 21, 2023 reported T-score of -2.7.  Patient was given a prescription for Fosamax  along with continuation of calcium and vitamin D supplementation.  Repeat bone mineral density in February 2026.   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  I provided 20 minutes of face-to-face video visit time during this encounter which included chart review, counseling, and coordination of care as documented above.       Patient expressed understanding and was in agreement with this plan. She also understands that She can call clinic at any time with any questions, concerns, or complaints.    Cancer Staging  Bilateral breast cancer [facility]) Staging form: Breast, AJCC 8th Edition - Pathologic stage from 08/22/2022: Stage IA (pT1c, pN0, cM0, G3, ER+, PR+, HER2-, Oncotype DX score: 14) - Signed by Jacobo Evalene PARAS, MD on 08/22/2022 Stage prefix: Initial diagnosis Multigene prognostic tests performed: Oncotype DX Recurrence score range: Greater than or equal to 11 Histologic grading system: 3 grade system   Evalene PARAS Jacobo, MD   08/27/2024 4:04 PM    Both right and left breast cancer were stage Ia.

## 2024-09-01 ENCOUNTER — Other Ambulatory Visit: Payer: Self-pay

## 2024-09-01 ENCOUNTER — Inpatient Hospital Stay: Attending: Oncology | Admitting: Oncology

## 2024-09-01 DIAGNOSIS — Z170 Estrogen receptor positive status [ER+]: Secondary | ICD-10-CM

## 2024-09-01 DIAGNOSIS — C50912 Malignant neoplasm of unspecified site of left female breast: Secondary | ICD-10-CM

## 2024-09-01 DIAGNOSIS — C50911 Malignant neoplasm of unspecified site of right female breast: Secondary | ICD-10-CM | POA: Diagnosis not present

## 2024-09-01 DIAGNOSIS — Z780 Asymptomatic menopausal state: Secondary | ICD-10-CM

## 2024-09-01 NOTE — Progress Notes (Unsigned)
 [facility]  [phone] [phone]  ID: Marie Nguyen OB: [birth year]  MR#: [id number]  RDW#:[id number]  Patient Care Team: Lemon Raisin, MD as PCP - General (Internal Medicine) Dellie Louanne MATSU, MD (General Surgery) Vonzell Lynwood VEAR Raddle., MD (Inactive) (Family Medicine) Georgina Shasta POUR, RN as Oncology Nurse Navigator Jacobo, Evalene PARAS, MD as Consulting Physician (Oncology)  I connected with Marie Nguyen on 09/01/24 at  3:30 PM EST by {Blank single:19197::video enabled telemedicine visit,telephone visit} and verified that I am speaking with the correct person using two identifiers.   I discussed the limitations, risks, security and privacy concerns of performing an evaluation and management service by telemedicine and the availability of in-person appointments. I also discussed with the patient that there may be a patient responsible charge related to this service. The patient expressed understanding and agreed to proceed.   Other persons participating in the visit and their role in the encounter: Patient, MD.  Patient's location: Home. Provider's location: Clinic.   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    INTERVAL HISTORY: Patient agreed to an assisted telemedicine visit for routine 30-month evaluation.  She currently feels well and is asymptomatic.  She is tolerating letrozole  without significant side effects. She has no neurologic complaints.  She denies any recent fevers or illnesses.  She has a good appetite and denies weight loss.  She has no chest pain, shortness of breath, cough, or home this.  She denies any nausea, vomiting, constipation, or diarrhea.  She has no urinary complaints.  Patient offers no specific complaints today.  REVIEW OF SYSTEMS:   Review of Systems  Constitutional: Negative.  Negative for fever, malaise/fatigue and weight loss.  Respiratory: Negative.   Negative for cough, hemoptysis and shortness of breath.   Cardiovascular: Negative.  Negative for chest pain, palpitations and leg swelling.  Gastrointestinal: Negative.  Negative for abdominal pain.  Genitourinary: Negative.  Negative for dysuria.  Musculoskeletal: Negative.  Negative for back pain.  Skin: Negative.  Negative for rash.  Neurological: Negative.  Negative for dizziness, focal weakness, weakness and headaches.  Psychiatric/Behavioral: Negative.  The patient is not nervous/anxious.     As per HPI. Otherwise, a complete review of systems is negative.  PAST MEDICAL HISTORY: Past Medical History:  Diagnosis Date  . Allergy   . Arthritis   . Eczema   . GERD (gastroesophageal reflux disease)   . Heart murmur   . Hypertension     PAST SURGICAL HISTORY: Past Surgical History:  Procedure Laterality Date  . ABDOMINAL HYSTERECTOMY    . AUGMENTATION MAMMAPLASTY    . AXILLARY SENTINEL NODE BIOPSY Bilateral 07/26/2022   Procedure: AXILLARY SENTINEL NODE BIOPSY;  Surgeon: Desiderio Schanz, MD;  Location: [facility] ORS;  Service: General;  Laterality: Bilateral;  . BREAST BIOPSY Right    neg  . BREAST BIOPSY Right 05/16/2022   x 2 areas/ us  bx & Stereo bx coil clip/INC and DCIS  . BREAST BIOPSY Left 05/16/2022   us  bx/ [facility] with DCIS  . BREAST RECONSTRUCTION WITH PLACEMENT OF TISSUE EXPANDER AND ALLODERM Bilateral 07/26/2022   Procedure: BREAST RECONSTRUCTION WITH PLACEMENT OF TISSUE EXPANDER AND ALLODERM;  Surgeon: Lowery Estefana RAMAN, DO;  Location: [facility] ORS;  Service: Plastics;  Laterality: Bilateral;  . CESAREAN SECTION    . COLONOSCOPY WITH PROPOFOL  N/A 11/24/2018   Procedure: COLONOSCOPY WITH PROPOFOL ;  Surgeon: Therisa Bi, MD;  Location: [facility] ENDOSCOPY;  Service: Gastroenterology;  Laterality: N/A;  .  COLONOSCOPY WITH PROPOFOL  N/A 07/03/2021   Procedure: COLONOSCOPY WITH PROPOFOL ;  Surgeon: Therisa Bi, MD;  Location: [facility] ENDOSCOPY;  Service: Gastroenterology;  Laterality:  N/A;  . GANGLION CYST EXCISION Right    wrist  . LIPOMA EXCISION Left 07/31/2023   Procedure: left thigh excision of lipoma/mass;  Surgeon: Lowery Estefana RAMAN, DO;  Location: [facility];  Service: Plastics;  Laterality: Left;  SABRA MASTECTOMY Bilateral   . REMOVAL OF TISSUE EXPANDER AND PLACEMENT OF IMPLANT Bilateral 11/14/2022   Procedure: REMOVAL OF TISSUE EXPANDER AND PLACEMENT OF IMPLANT;  Surgeon: Lowery Estefana RAMAN, DO;  Location: [facility];  Service: Plastics;  Laterality: Bilateral;  . SIMPLE MASTECTOMY WITH AXILLARY SENTINEL NODE BIOPSY Bilateral 07/26/2022   Procedure: SIMPLE MASTECTOMY, Darlyn Platt, PA-C to assist;  Surgeon: Desiderio Schanz, MD;  Location: [facility]S;  Service: General;  Laterality: Bilateral;  . TOTAL ABDOMINAL HYSTERECTOMY      FAMILY HISTORY: Family History  Problem Relation Age of Onset  . Breast cancer Mother 4  . Hypertension Mother   . Diabetes Father   . Hypertension Father   . Stroke Father   . Hypertension Sister   . Colon cancer Maternal Grandfather 79    ADVANCED DIRECTIVES (Y/N):  N  HEALTH MAINTENANCE: Social History   Tobacco Use  . Smoking status: Never    Passive exposure: Never  . Smokeless tobacco: Never  Vaping Use  . Vaping status: Never Used  Substance Use Topics  . Alcohol use: Yes    Comment: rarely  . Drug use: Never     Colonoscopy:  PAP:  Bone density:  Lipid panel:  Allergies  Allergen Reactions  . Latex Rash    Current Outpatient Medications  Medication Sig Dispense Refill  . acetaminophen  (TYLENOL ) 500 MG tablet Take 1,000 mg by mouth every 6 (six) hours as needed for mild pain (pain score 1-3).    . alendronate  (FOSAMAX ) 70 MG tablet Take 1 tablet (70 mg total) by mouth once a week. Take with a full glass of water  on an empty stomach. 12 tablet 3  . azelastine (ASTELIN) 0.1 % nasal spray Place 1 spray into both nostrils daily. Use in each nostril as directed    .  calcium-vitamin D (OSCAL WITH D) 500-5 MG-MCG tablet Take 1 tablet by mouth.    . hydroquinone 4 % cream Apply topically at bedtime.    SABRA ibuprofen (ADVIL) 200 MG tablet Take 200 mg by mouth every 6 (six) hours as needed for mild pain (pain score 1-3). 2 - 3 tabs    . letrozole  (FEMARA ) 2.5 MG tablet TAKE 1 TABLET BY MOUTH EVERY DAY 90 tablet 0  . lisinopril -hydrochlorothiazide  (ZESTORETIC ) 10-12.5 MG tablet Take 1 tablet by mouth daily. 90 tablet 1  . Multiple Vitamin (MULTIVITAMIN) tablet Take 1 tablet by mouth daily.    . Omega-3 Fatty Acids (FISH OIL) 1000 MG CAPS Take 1 capsule by mouth as needed.    SABRA omeprazole (PRILOSEC) 20 MG capsule Take 20 mg by mouth daily.    SABRA tretinoin (RETIN-A) 0.025 % cream Apply topically at bedtime.    . triamcinolone  cream (KENALOG ) 0.1 % Apply 1 Application topically 2 (two) times daily.     No current facility-administered medications for this visit.    OBJECTIVE: There were no vitals filed for this visit.    There is no height or weight on file to calculate BMI.    ECOG FS:0 - Asymptomatic  General: Well-developed,  well-nourished, no acute distress. HEENT: Normocephalic. Neuro: Alert, answering all questions appropriately. Cranial nerves grossly intact. Psych: Normal affect.  LAB RESULTS:  Lab Results  Component Value Date   NA 141 05/26/2024   K 4.2 05/26/2024   CL 102 05/26/2024   CO2 25 05/26/2024   GLUCOSE 89 05/26/2024   BUN 22 05/26/2024   CREATININE 0.74 05/26/2024   CALCIUM 10.1 05/26/2024   PROT 6.9 03/10/2021   ALBUMIN 4.7 12/22/2021   AST 18 03/10/2021   ALT 16 03/10/2021   ALKPHOS 58 03/10/2021   BILITOT 1.3 (H) 03/10/2021   GFRNONAA >60 07/26/2023   GFRAA 102 11/19/2018    Lab Results  Component Value Date   WBC 4.7 12/04/2022   NEUTROABS 2.6 12/04/2022   HGB 13.5 12/04/2022   HCT 41.2 12/04/2022   MCV 96 12/04/2022   PLT 269 12/04/2022     STUDIES: No results found.  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  Oncotype DX 14.  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  Her right breast cancer was a pT1c, left breast cancer was pT1b.  Patient had bilateral mastectomy, therefore there is no need for adjuvant XRT.  Oncotype DX score was low risk at 14, therefore despite 2 primaries patient did not require adjuvant chemotherapy.  Continue letrozole  for minimum of 5 years completing treatment in December 2028.  Will consider extending treatment given her high risk disease.  No further intervention is needed.  Return to clinic in 6 months with video-assisted telemedicine visit.  Osteoporosis: Patient's most recent bone mineral density on November 21, 2023 reported T-score of -2.7.  Patient was given a prescription for Fosamax  along with continuation of calcium and vitamin D supplementation.  Repeat bone mineral density in February 2026.   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  I provided 20 minutes of face-to-face video visit time during this encounter which included chart review, counseling, and coordination of care as documented above.       Patient expressed understanding and was in agreement with this plan. She also understands that She can call clinic at any time with any questions, concerns, or complaints.    Cancer Staging  Bilateral breast cancer [facility]) Staging form: Breast, AJCC 8th Edition - Pathologic stage from 08/22/2022: Stage IA (pT1c, pN0, cM0, G3, ER+, PR+, HER2-, Oncotype DX score: 14) - Signed by Jacobo Evalene PARAS, MD on 08/22/2022 Stage prefix: Initial diagnosis Multigene prognostic tests performed: Oncotype DX Recurrence score range: Greater than or equal to 11 Histologic grading system: 3 grade system   Evalene PARAS Jacobo, MD   09/01/2024 3:28 PM    Both right and left breast cancer were stage Ia.

## 2024-11-23 ENCOUNTER — Other Ambulatory Visit

## 2024-11-24 ENCOUNTER — Ambulatory Visit: Admitting: Student

## 2025-03-15 ENCOUNTER — Inpatient Hospital Stay: Admitting: Oncology
